# Patient Record
Sex: Female | Born: 1960 | Race: Black or African American | Hispanic: No | Smoking: Former smoker
Health system: Southern US, Community
[De-identification: ages and names within clinical notes are randomized; demographics above are authoritative.]

## PROBLEM LIST (undated history)

## (undated) DIAGNOSIS — K2970 Gastritis, unspecified, without bleeding: Secondary | ICD-10-CM

## (undated) DIAGNOSIS — Z972 Presence of dental prosthetic device (complete) (partial): Secondary | ICD-10-CM

## (undated) DIAGNOSIS — I10 Essential (primary) hypertension: Secondary | ICD-10-CM

## (undated) HISTORY — DX: Gastritis, unspecified, without bleeding: K29.70

## (undated) HISTORY — DX: Essential (primary) hypertension: I10

---

## 2003-02-13 ENCOUNTER — Other Ambulatory Visit: Payer: Self-pay

## 2003-11-15 ENCOUNTER — Ambulatory Visit: Payer: Self-pay | Admitting: Family Medicine

## 2010-02-08 HISTORY — PX: COLONOSCOPY: SHX174

## 2011-05-25 ENCOUNTER — Ambulatory Visit: Payer: Self-pay | Admitting: Family Medicine

## 2012-08-03 ENCOUNTER — Ambulatory Visit: Payer: Self-pay | Admitting: Family Medicine

## 2013-08-07 ENCOUNTER — Ambulatory Visit: Payer: Self-pay | Admitting: Family Medicine

## 2013-08-16 ENCOUNTER — Ambulatory Visit: Payer: Self-pay | Admitting: Family Medicine

## 2013-10-08 ENCOUNTER — Ambulatory Visit: Payer: Self-pay | Admitting: Family Medicine

## 2013-10-09 ENCOUNTER — Ambulatory Visit: Payer: Self-pay | Admitting: Family Medicine

## 2014-07-11 ENCOUNTER — Other Ambulatory Visit: Payer: Self-pay | Admitting: Family Medicine

## 2014-07-11 DIAGNOSIS — I10 Essential (primary) hypertension: Secondary | ICD-10-CM

## 2014-08-05 ENCOUNTER — Encounter: Payer: Self-pay | Admitting: Family Medicine

## 2014-08-05 ENCOUNTER — Ambulatory Visit (INDEPENDENT_AMBULATORY_CARE_PROVIDER_SITE_OTHER): Payer: BC Managed Care – PPO | Admitting: Family Medicine

## 2014-08-05 VITALS — BP 130/82 | HR 80 | Ht 61.0 in | Wt 120.0 lb

## 2014-08-05 DIAGNOSIS — I10 Essential (primary) hypertension: Secondary | ICD-10-CM | POA: Diagnosis not present

## 2014-08-05 DIAGNOSIS — Z0000 Encounter for general adult medical examination without abnormal findings: Secondary | ICD-10-CM | POA: Insufficient documentation

## 2014-08-05 LAB — POCT URINALYSIS DIPSTICK
BILIRUBIN UA: NEGATIVE
Blood, UA: NEGATIVE
Glucose, UA: NEGATIVE
KETONES UA: NEGATIVE
LEUKOCYTES UA: NEGATIVE
NITRITE UA: NEGATIVE
Protein, UA: NEGATIVE
Spec Grav, UA: 1.02
Urobilinogen, UA: NEGATIVE
pH, UA: 5

## 2014-08-05 MED ORDER — AMLODIPINE BESYLATE 5 MG PO TABS: 5.0000 mg | ORAL_TABLET | Freq: Every day | ORAL | Status: DC

## 2014-08-06 NOTE — Progress Notes (Signed)
Name: Samantha KennedyMary Delois Irwin   MRN: [id number]    DOB: [birth year]   Date:08/06/2014       Progress Note  Subjective  Chief Complaint  Chief Complaint  Patient presents with  . Annual Exam    Pt. denies any problems    HPI Comments: Patient without any concerns or needs  Other Pertinent negatives include no abdominal pain, chest pain, chills, coughing, fever, headaches, myalgias, nausea, neck pain, rash or sore throat.    No problem-specific assessment & plan notes found for this encounter.   No past medical history on file.  Past Surgical History  Procedure Laterality Date  . Vaginal hysterectomy      partial    No family history on file.  History   Social History  . Marital Status: Divorced    Spouse Name: N/A  . Number of Children: N/A  . Years of Education: N/A   Occupational History  . Not on file.   Social History Main Topics  . Smoking status: Current Every Day Smoker -- 1.00 packs/day    Types: Cigarettes  . Smokeless tobacco: Not on file  . Alcohol Use: 0.0 oz/week    0 Standard drinks or equivalent per week  . Drug Use: No  . Sexual Activity: Not on file   Other Topics Concern  . Not on file   Social History Narrative  . No narrative on file    Not on File   Review of Systems  Constitutional: Negative for fever, chills, weight loss and malaise/fatigue.  HENT: Negative for ear discharge, ear pain and sore throat.   Eyes: Negative for blurred vision.  Respiratory: Negative for cough, sputum production, shortness of breath and wheezing.   Cardiovascular: Negative for chest pain, palpitations and leg swelling.  Gastrointestinal: Negative for heartburn, nausea, abdominal pain, diarrhea, constipation, blood in stool and melena.  Genitourinary: Negative for dysuria, urgency, frequency and hematuria.  Musculoskeletal: Negative for myalgias, back pain, joint pain and neck pain.  Skin: Negative for rash.  Neurological: Negative for dizziness,  tingling, sensory change, focal weakness and headaches.  Endo/Heme/Allergies: Negative for environmental allergies and polydipsia. Does not bruise/bleed easily.  Psychiatric/Behavioral: Negative for depression and suicidal ideas. The patient is not nervous/anxious and does not have insomnia.      Objective  Filed Vitals:   08/05/14 1458  BP: 130/82  Pulse: 80  Height: 5\' 1"  (1.549 m)  Weight: 120 lb (54.432 kg)    Physical Exam  Constitutional: She is well-developed, well-nourished, and in no distress. No distress.  HENT:  Head: Normocephalic and atraumatic.  Right Ear: External ear normal.  Left Ear: External ear normal.  Nose: Nose normal.  Mouth/Throat: Oropharynx is clear and moist.  Eyes: Conjunctivae and EOM are normal. Pupils are equal, round, and reactive to light. Right eye exhibits no discharge. Left eye exhibits no discharge.  Neck: Normal range of motion. Neck supple. No JVD present. No thyromegaly present.  Cardiovascular: Normal rate, regular rhythm, normal heart sounds and intact distal pulses.  Exam reveals no gallop and no friction rub.   No murmur heard. Pulmonary/Chest: Effort normal and breath sounds normal. Right breast exhibits no inverted nipple, no mass, no nipple discharge and no tenderness. Left breast exhibits no inverted nipple, no mass, no nipple discharge and no tenderness. Breasts are symmetrical.  Abdominal: Soft. Bowel sounds are normal. She exhibits no mass. There is no tenderness. There is no guarding.  Genitourinary: Vagina normal, right adnexa normal and left adnexa  normal. Guaiac negative stool.  Musculoskeletal: Normal range of motion. She exhibits no edema.  Lymphadenopathy:    She has no cervical adenopathy.  Neurological: She is alert. She has normal reflexes.  Skin: Skin is warm and dry. She is not diaphoretic.  Psychiatric: Mood and affect normal.  Nursing note and vitals reviewed.     Assessment & Plan  Problem List Items  Addressed This Visit      Other   Annual physical exam - Primary   Relevant Orders   POCT urinalysis dipstick (Completed)   Lipid Profile   Pap IG (Image Guided)   MM Digital Screening    Other Visit Diagnoses    Essential hypertension        Relevant Medications    amLODipine (NORVASC) 5 MG tablet    Other Relevant Orders    Renal Function Panel         Dr. Elizabeth Sauer [facility]  08/06/2014

## 2014-08-08 LAB — PAP IG (IMAGE GUIDED): PAP Smear Comment: 0

## 2014-08-14 ENCOUNTER — Ambulatory Visit
Admission: RE | Admit: 2014-08-14 | Discharge: 2014-08-14 | Disposition: A | Discharge status: Home / Self Care | Payer: BC Managed Care – PPO | Source: Ambulatory Visit | Attending: Family Medicine | Admitting: Family Medicine

## 2014-08-14 DIAGNOSIS — Z0000 Encounter for general adult medical examination without abnormal findings: Secondary | ICD-10-CM

## 2014-08-14 DIAGNOSIS — Z1231 Encounter for screening mammogram for malignant neoplasm of breast: Secondary | ICD-10-CM | POA: Insufficient documentation

## 2014-11-07 ENCOUNTER — Other Ambulatory Visit: Payer: Self-pay

## 2014-11-15 ENCOUNTER — Ambulatory Visit (INDEPENDENT_AMBULATORY_CARE_PROVIDER_SITE_OTHER): Payer: BC Managed Care – PPO | Admitting: Family Medicine

## 2014-11-15 ENCOUNTER — Encounter: Payer: Self-pay | Admitting: Family Medicine

## 2014-11-15 VITALS — BP 122/84 | HR 80 | Ht 61.0 in | Wt 121.0 lb

## 2014-11-15 DIAGNOSIS — N898 Other specified noninflammatory disorders of vagina: Secondary | ICD-10-CM | POA: Diagnosis not present

## 2014-11-15 DIAGNOSIS — H6123 Impacted cerumen, bilateral: Secondary | ICD-10-CM | POA: Diagnosis not present

## 2014-11-15 DIAGNOSIS — N939 Abnormal uterine and vaginal bleeding, unspecified: Secondary | ICD-10-CM

## 2014-11-15 DIAGNOSIS — S30814A Abrasion of vagina and vulva, initial encounter: Secondary | ICD-10-CM

## 2014-11-15 NOTE — Progress Notes (Signed)
Name: Samantha Irwin   MRN: [id number]    DOB: 06-28-1960   Date:11/15/2014       Progress Note  Subjective  Chief Complaint  Chief Complaint  Patient presents with  . Vaginal Bleeding    off menstrual cycle x 5 years- noticed pink spotting after sex- not enough to wear a panty liner. Has stopped- had a pap in June that was normal    Vaginal Bleeding The patient's pertinent negatives include no genital itching, genital lesions, genital odor, genital rash, missed menses, pelvic pain, vaginal bleeding or vaginal discharge. This is a chronic problem. The current episode started 1 to 4 weeks ago. The problem occurs intermittently. The problem has been waxing and waning. The patient is experiencing no pain. She is not pregnant. Pertinent negatives include no abdominal pain, back pain, chills, constipation, dysuria, fever, flank pain, frequency, painful intercourse or rash. Nothing aggravates the symptoms. She has tried nothing for the symptoms. She is sexually active. She uses nothing for contraception. She is postmenopausal.    No problem-specific assessment & plan notes found for this encounter.   Past Medical History  Diagnosis Date  . Hypertension     Past Surgical History  Procedure Laterality Date  . Colonoscopy  2012    cleared for 5 yrs- [place]    Family History  Problem Relation Age of Onset  . Diabetes Mother     Social History   Social History  . Marital Status: Divorced    Spouse Name: N/A  . Number of Children: N/A  . Years of Education: N/A   Occupational History  . Not on file.   Social History Main Topics  . Smoking status: Current Every Day Smoker -- 1.00 packs/day    Types: Cigarettes  . Smokeless tobacco: Not on file  . Alcohol Use: 0.0 oz/week    0 Standard drinks or equivalent per week  . Drug Use: No  . Sexual Activity: Yes   Other Topics Concern  . Not on file   Social History Narrative    No Known Allergies   Review of Systems   Constitutional: Negative for fever, chills and weight loss.  Respiratory: Negative for cough and shortness of breath.   Cardiovascular: Negative for chest pain.  Gastrointestinal: Negative for heartburn, abdominal pain and constipation.  Genitourinary: Positive for vaginal bleeding. Negative for dysuria, frequency, flank pain, vaginal discharge, pelvic pain and missed menses.       Gyn see HPI  Musculoskeletal: Negative for back pain.  Skin: Negative for itching and rash.  Endo/Heme/Allergies: Does not bruise/bleed easily.     Objective  Filed Vitals:   11/15/14 0942  BP: 122/84  Pulse: 80  Height:  (1.549 m)  Weight: 121 lb (54.885 kg)    Physical Exam  Constitutional: She is well-developed, well-nourished, and in no distress. No distress.  HENT:  Head: Normocephalic and atraumatic.  Right Ear: External ear normal.  Left Ear: External ear normal.  Nose: Nose normal.  Mouth/Throat: Oropharynx is clear and moist.  Eyes: Conjunctivae and EOM are normal. Pupils are equal, round, and reactive to light. Right eye exhibits no discharge. Left eye exhibits no discharge.  Neck: Normal range of motion. Neck supple. No JVD present. No thyromegaly present.  Cardiovascular: Normal rate, regular rhythm, normal heart sounds and intact distal pulses.  Exam reveals no gallop and no friction rub.   No murmur heard. Pulmonary/Chest: Effort normal and breath sounds normal.  Abdominal: Soft. Bowel sounds are  normal. She exhibits no mass. There is no tenderness. There is no guarding.  Genitourinary: Vagina normal. No vaginal discharge found.  Vaginal abrasion 3 o'clock  Musculoskeletal: Normal range of motion. She exhibits no edema.  Lymphadenopathy:    She has no cervical adenopathy.  Neurological: She is alert. She has normal reflexes.  Skin: Skin is warm and dry. She is not diaphoretic.  Psychiatric: Mood and affect normal.      Assessment & Plan  Problem List Items Addressed  This Visit    None    Visit Diagnoses    Vaginal spotting    -  Primary    Cerumen impaction, bilateral        bulb syringe and debrox    Vaginal abrasion, initial encounter             Dr. Elizabeth Sauer [facility]  11/15/2014

## 2015-03-31 ENCOUNTER — Other Ambulatory Visit: Payer: Self-pay

## 2015-04-09 ENCOUNTER — Ambulatory Visit (INDEPENDENT_AMBULATORY_CARE_PROVIDER_SITE_OTHER): Payer: BC Managed Care – PPO | Admitting: Family Medicine

## 2015-04-09 ENCOUNTER — Encounter: Payer: Self-pay | Admitting: Family Medicine

## 2015-04-09 VITALS — BP 130/100 | HR 68 | Temp 97.8°F | Ht 61.0 in | Wt 120.0 lb

## 2015-04-09 DIAGNOSIS — E860 Dehydration: Secondary | ICD-10-CM | POA: Diagnosis not present

## 2015-04-09 DIAGNOSIS — J0110 Acute frontal sinusitis, unspecified: Secondary | ICD-10-CM

## 2015-04-09 MED ORDER — AMOXICILLIN 500 MG PO CAPS: 500.0000 mg | ORAL_CAPSULE | Freq: Three times a day (TID) | ORAL | Status: DC

## 2015-04-09 NOTE — Progress Notes (Signed)
Name: Samantha Irwin   MRN: [id number]    DOB: 1961/01/06   Date:04/09/2015       Progress Note  Subjective  Chief Complaint  Chief Complaint  Patient presents with  . Sinusitis    with "jittery feeling" some nausea    Sinusitis This is a new problem. The current episode started in the past 7 days. The problem has been waxing and waning since onset. There has been no fever. Associated symptoms include congestion, diaphoresis, ear pain, headaches and sinus pressure. Pertinent negatives include no chills, coughing, hoarse voice, neck pain, shortness of breath, sneezing, sore throat or swollen glands. (Shakified/ dizziness/ fatigued/ malaise/nausea) Past treatments include acetaminophen. The treatment provided mild relief.    No problem-specific assessment & plan notes found for this encounter.   Past Medical History  Diagnosis Date  . Hypertension     Past Surgical History  Procedure Laterality Date  . Colonoscopy  2012    cleared for 5 yrs- [place]    Family History  Problem Relation Age of Onset  . Diabetes Mother     Social History   Social History  . Marital Status: Divorced    Spouse Name: N/A  . Number of Children: N/A  . Years of Education: N/A   Occupational History  . Not on file.   Social History Main Topics  . Smoking status: Current Every Day Smoker -- 1.00 packs/day    Types: Cigarettes  . Smokeless tobacco: Not on file  . Alcohol Use: 0.0 oz/week    0 Standard drinks or equivalent per week  . Drug Use: No  . Sexual Activity: Yes   Other Topics Concern  . Not on file   Social History Narrative    No Known Allergies   Review of Systems  Constitutional: Positive for diaphoresis. Negative for fever, chills, weight loss and malaise/fatigue.  HENT: Positive for congestion, ear pain and sinus pressure. Negative for ear discharge, hoarse voice, sneezing and sore throat.   Eyes: Negative for blurred vision.  Respiratory: Negative for cough,  sputum production, shortness of breath and wheezing.   Cardiovascular: Negative for chest pain, palpitations and leg swelling.  Gastrointestinal: Negative for heartburn, nausea, abdominal pain, diarrhea, constipation, blood in stool and melena.  Genitourinary: Negative for dysuria, urgency, frequency and hematuria.  Musculoskeletal: Negative for myalgias, back pain, joint pain and neck pain.  Skin: Negative for rash.  Neurological: Positive for headaches. Negative for dizziness, tingling, sensory change and focal weakness.  Endo/Heme/Allergies: Negative for environmental allergies and polydipsia. Does not bruise/bleed easily.  Psychiatric/Behavioral: Negative for depression and suicidal ideas. The patient is not nervous/anxious and does not have insomnia.      Objective  Filed Vitals:   04/09/15 0841  BP: 130/100  Pulse: 68  Temp: 97.8 F (36.6 C)  TempSrc: Oral  Height:  (1.549 m)  Weight: 120 lb (54.432 kg)    Physical Exam  Constitutional: She is well-developed, well-nourished, and in no distress. No distress.  HENT:  Head: Normocephalic and atraumatic.  Right Ear: External ear normal.  Left Ear: External ear normal.  Nose: Nose normal.  Mouth/Throat: Oropharynx is clear and moist.  Eyes: Conjunctivae and EOM are normal. Pupils are equal, round, and reactive to light. Right eye exhibits no discharge. Left eye exhibits no discharge.  Neck: Normal range of motion. Neck supple. No JVD present. No thyromegaly present.  Cardiovascular: Normal rate, regular rhythm, normal heart sounds and intact distal pulses.  Exam reveals no  gallop and no friction rub.   No murmur heard. Pulmonary/Chest: Effort normal and breath sounds normal.  Abdominal: Soft. Bowel sounds are normal. She exhibits no mass. There is no tenderness. There is no guarding.  Musculoskeletal: Normal range of motion. She exhibits no edema.  Lymphadenopathy:    She has no cervical adenopathy.  Neurological: She  is alert. She has normal reflexes.  Skin: Skin is warm and dry. She is not diaphoretic.  Psychiatric: Mood and affect normal.  Nursing note and vitals reviewed.     Assessment & Plan  Problem List Items Addressed This Visit    None    Visit Diagnoses    Acute frontal sinusitis, recurrence not specified    -  Primary    Relevant Medications    amoxicillin (AMOXIL) 500 MG capsule    Dehydration             Dr. Hayden [facility]  04/09/2015

## 2015-06-02 ENCOUNTER — Other Ambulatory Visit: Payer: Self-pay | Admitting: Family Medicine

## 2015-06-03 ENCOUNTER — Other Ambulatory Visit: Payer: Self-pay

## 2015-06-03 DIAGNOSIS — I10 Essential (primary) hypertension: Secondary | ICD-10-CM

## 2015-06-03 MED ORDER — AMLODIPINE BESYLATE 5 MG PO TABS: 5.0000 mg | ORAL_TABLET | Freq: Every day | ORAL | Status: DC

## 2015-06-12 ENCOUNTER — Encounter: Payer: Self-pay | Admitting: Family Medicine

## 2015-06-12 ENCOUNTER — Ambulatory Visit (INDEPENDENT_AMBULATORY_CARE_PROVIDER_SITE_OTHER): Payer: BC Managed Care – PPO | Admitting: Family Medicine

## 2015-06-12 VITALS — BP 122/82 | HR 78 | Ht 61.0 in | Wt 120.0 lb

## 2015-06-12 DIAGNOSIS — I10 Essential (primary) hypertension: Secondary | ICD-10-CM

## 2015-06-12 DIAGNOSIS — R351 Nocturia: Secondary | ICD-10-CM

## 2015-06-12 LAB — POCT URINALYSIS DIPSTICK
BILIRUBIN UA: NEGATIVE
Glucose, UA: NEGATIVE
KETONES UA: NEGATIVE
Leukocytes, UA: NEGATIVE
NITRITE UA: NEGATIVE
PH UA: 5
Protein, UA: NEGATIVE
RBC UA: NEGATIVE
Spec Grav, UA: 1.01
Urobilinogen, UA: 0.2

## 2015-06-12 MED ORDER — AMLODIPINE BESYLATE 5 MG PO TABS: 5.0000 mg | ORAL_TABLET | Freq: Every day | ORAL | Status: DC

## 2015-06-12 NOTE — Progress Notes (Signed)
Name: Samantha Irwin   MRN: [id number]    DOB: 21-May-1960   Date:06/12/2015       Progress Note  Subjective  Chief Complaint  Chief Complaint  Patient presents with  . Hypertension    Hypertension This is a chronic problem. The current episode started more than 1 year ago. The problem has been gradually improving since onset. The problem is controlled. Pertinent negatives include no anxiety, blurred vision, chest pain, headaches, malaise/fatigue, neck pain, orthopnea, palpitations, peripheral edema, PND, shortness of breath or sweats. There are no associated agents to hypertension. There are no known risk factors for coronary artery disease. Past treatments include calcium channel blockers. The current treatment provides moderate improvement. There are no compliance problems.  There is no history of angina, kidney disease, CAD/MI, CVA, heart failure, left ventricular hypertrophy, PVD, renovascular disease or retinopathy. There is no history of chronic renal disease or a hypertension causing med.    No problem-specific assessment & plan notes found for this encounter.   Past Medical History  Diagnosis Date  . Hypertension     Past Surgical History  Procedure Laterality Date  . Colonoscopy  2012    cleared for 5 yrs- [place]    Family History  Problem Relation Age of Onset  . Diabetes Mother     Social History   Social History  . Marital Status: Divorced    Spouse Name: N/A  . Number of Children: N/A  . Years of Education: N/A   Occupational History  . Not on file.   Social History Main Topics  . Smoking status: Current Every Day Smoker -- 1.00 packs/day    Types: Cigarettes  . Smokeless tobacco: Not on file  . Alcohol Use: 0.0 oz/week    0 Standard drinks or equivalent per week  . Drug Use: No  . Sexual Activity: Yes   Other Topics Concern  . Not on file   Social History Narrative    No Known Allergies   Review of Systems  Constitutional: Negative  for fever, chills, weight loss and malaise/fatigue.  HENT: Negative for ear discharge, ear pain and sore throat.   Eyes: Negative for blurred vision.  Respiratory: Negative for cough, sputum production, shortness of breath and wheezing.   Cardiovascular: Negative for chest pain, palpitations, orthopnea, leg swelling and PND.  Gastrointestinal: Negative for heartburn, nausea, abdominal pain, diarrhea, constipation, blood in stool and melena.  Genitourinary: Positive for frequency. Negative for dysuria, urgency and hematuria.  Musculoskeletal: Negative for myalgias, back pain, joint pain and neck pain.  Skin: Negative for rash.  Neurological: Negative for dizziness, tingling, sensory change, focal weakness and headaches.  Endo/Heme/Allergies: Negative for environmental allergies and polydipsia. Does not bruise/bleed easily.  Psychiatric/Behavioral: Negative for depression and suicidal ideas. The patient is not nervous/anxious and does not have insomnia.      Objective  Filed Vitals:   06/12/15 0808  BP: 122/82  Pulse: 78  Height:  (1.549 m)  Weight: 120 lb (54.432 kg)    Physical Exam  Constitutional: She is well-developed, well-nourished, and in no distress. No distress.  HENT:  Head: Normocephalic and atraumatic.  Right Ear: External ear normal.  Left Ear: External ear normal.  Nose: Nose normal.  Mouth/Throat: Oropharynx is clear and moist.  Eyes: Conjunctivae and EOM are normal. Pupils are equal, round, and reactive to light. Right eye exhibits no discharge. Left eye exhibits no discharge.  Neck: Normal range of motion. Neck supple. No JVD present. No  thyromegaly present.  Cardiovascular: Normal rate, regular rhythm, normal heart sounds and intact distal pulses.  Exam reveals no gallop and no friction rub.   No murmur heard. Pulmonary/Chest: Effort normal and breath sounds normal. She has no wheezes. She has no rales.  Abdominal: Soft. Bowel sounds are normal. She  exhibits no mass. There is no tenderness. There is no guarding.  Musculoskeletal: Normal range of motion. She exhibits no edema.  Lymphadenopathy:    She has no cervical adenopathy.  Neurological: She is alert. She has normal reflexes.  Skin: Skin is warm and dry. She is not diaphoretic.  Psychiatric: Mood and affect normal.  Nursing note and vitals reviewed.     Assessment & Plan  Problem List Items Addressed This Visit      Cardiovascular and Mediastinum   Essential hypertension - Primary   Relevant Medications   amLODipine (NORVASC) 5 MG tablet   Other Relevant Orders   POCT urinalysis dipstick (Completed)   Renal Function Panel   Hemoglobin A1c    Other Visit Diagnoses    Nocturia        Relevant Orders    Hemoglobin A1c         Dr. Hayden Rasmusseneanna [facility]  06/12/2015

## 2015-06-12 NOTE — Patient Instructions (Signed)
Smoking: Ways to Quit   Know why you want to quit.   When you quit smoking, your body gets to work repairing damaged tissues. Here are some of the health benefits:   You stop the destruction of your lungs.  Your lungs are better able to fight colds, and other respiratory infections.  You decrease your risk of cancer, heart disease, strokes, and circulation problems.   In addition, when you quit you will:   Feel more in control of your life.  Have better smelling hair, breath, clothes, home, and car.  Have more stamina for activities.  Save money.  Protect your family and friends from the dangers of secondhand smoke.   Smoking is an addictive habit. Most former smokers make several attempts to quit before they finally succeed. So, never say, "I can't." Just keep trying.   Set a quit date.   Set a date for when you will stop smoking. Don't buy cigarettes to carry you beyond your last day. Tell your family and friends you plan to quit, and ask for their support and encouragement. Ask them not to offer you cigarettes.  Make a plan.   5 Days Before Your Quit Date   Think about your reasons for quitting.  Tell your friends and family you are planning to quit.  Stop buying cigarettes.   4 Days Before Your Quit Date   Pay attention to when and why you smoke.  Think of other things to hold in your hand instead of a cigarette.  Think of habits or routines to change.   3 Days Before Your Quit Date   Plan what you will do with the extra money when you stop buying cigarettes.  Think of whom you can reach out to when you need help.   2 Days Before Your Quit Date   Consider buying nonprescription nicotine patches or nicotine gum. Or see your health care provider to get a prescription for the nicotine inhaler, nasal spray, or other medicine that can help.     1 Day Before Your Quit Date   Put away lighters and ashtrays.  Throw away all cigarettes and matches - no emergency stashes  are allowed!  Clean your clothes to get rid of the smell of cigarette smoke.   Quit Day   Keep very busy.  Remind family and friends that this is your quit day.  Stay away from alcohol.  Stay away from places where you used to smoke and people you used to smoke with.  Give yourself a treat or do something else special.   Commit to staying quit.   Make sure that all your cigarettes and ashtrays are thrown away.   If you keep cigarettes or ashtrays around, sooner or later you'll break down and smoke one, then another, then another, and so on. Throw them away. Make it hard to start again.   Because you are used to having something in your mouth, you may want to chew gum as a substitute for smoking. Or munch on carrots or celery.   Spend time with nonsmokers rather than with smokers.   Think of yourself as a nonsmoker. Tell other people that you are a nonsmoker (for example, in restaurants). Stay away from places where there are a lot of smokers, such as bars. Avoid spending time with smokers. You can't tell others not to smoke, but you don't have to sit with them while they do. Plan on walking away from cigarette smoke. Spend time   with nonsmokers and sit in the nonsmoking section of restaurants.   Be prepared for relapse or difficult situations.   Most people who go back to smoking cigarettes do so within the first 3 months after quitting. Many people try 5 or more times before they successfully quit. Avoid drinking alcohol, because it lowers your chances of success. Don't be distracted by the weight you may gain after quitting. Smokers usually do not gain more than 10 pounds when they stop smoking. Learn new ways to improve your mood and overcome depression.   Start an exercise program.   As you become more fit, you will not want the nicotine effects in your body. Regular exercise will also help keep you from gaining weight.   Keep your hands busy.   You may not know what to do with  your hands for a while. Try reading, knitting, needlework, pottery, drawing, making a plastic model, or doing a jigsaw puzzle. Join special interest groups that keep you involved in your hobby.      Take on new activities.   Change your routine. Take on new activities that don't include smoking. Join an exercise group and work out regularly. Sign up for an evening class or a join a study group at your place of worship. Go on more outings with your family or friends. Learn ways to relax and manage stress.   Join a quit-smoking program if it helps.   Some people do better in groups, or with a set of instructions to follow. That's fine, too. Remember, the goal is to quit smoking. It doesn't matter how you do it.   Consider using nicotine replacement therapy.   Nicotine is the drug that is in tobacco. You can use nicotine patches or gum, available without a prescription at your local pharmacy, to help you quit smoking. Quitting smoking is a two-step process. It includes breaking the physical addiction to nicotine and breaking the smoking habit. Nicotine replacement helps take care of the nicotine addiction so that you can focus on breaking the habit.   Your health care provider can prescribe nicotine substitutes that can almost double your chances of quitting for good. They are:   Zyban (bupropion HCL)  nicotine inhaler  nicotine lozenge  nicotine nasal spray  nicotine patch.   None of these treatments is a miracle cure. Quitting can be hard work, but you can learn to live without cigarettes in your daily life.  

## 2015-06-13 LAB — RENAL FUNCTION PANEL
ALBUMIN: 4.1 g/dL (ref 3.5–5.5)
BUN/Creatinine Ratio: 18 (ref 9–23)
BUN: 16 mg/dL (ref 6–24)
CALCIUM: 9.5 mg/dL (ref 8.7–10.2)
CHLORIDE: 101 mmol/L (ref 96–106)
CO2: 27 mmol/L (ref 18–29)
Creatinine, Ser: 0.88 mg/dL (ref 0.57–1.00)
GFR calc Af Amer: 85 mL/min/{1.73_m2} (ref >59)
GFR calc non Af Amer: 74 mL/min/{1.73_m2} (ref >59)
Glucose: 78 mg/dL (ref 65–99)
Phosphorus: 3.3 mg/dL (ref 2.5–4.5)
Potassium: 4.4 mmol/L (ref 3.5–5.2)
Sodium: 143 mmol/L (ref 134–144)

## 2015-06-13 LAB — HEMOGLOBIN A1C
Est. average glucose Bld gHb Est-mCnc: 114 mg/dL
HEMOGLOBIN A1C: 5.6 % (ref 4.8–5.6)

## 2015-07-18 ENCOUNTER — Ambulatory Visit
Admission: EM | Admit: 2015-07-18 | Discharge: 2015-07-18 | Disposition: A | Discharge status: Home / Self Care | Payer: BC Managed Care – PPO | Attending: Family Medicine | Admitting: Family Medicine

## 2015-07-18 DIAGNOSIS — S39012A Strain of muscle, fascia and tendon of lower back, initial encounter: Secondary | ICD-10-CM

## 2015-07-18 LAB — URINALYSIS COMPLETE WITH MICROSCOPIC (ARMC ONLY)
BILIRUBIN URINE: NEGATIVE
Glucose, UA: NEGATIVE mg/dL
Hgb urine dipstick: NEGATIVE
KETONES UR: NEGATIVE mg/dL
LEUKOCYTES UA: NEGATIVE
Nitrite: NEGATIVE
PH: 7.5 (ref 5.0–8.0)
Protein, ur: NEGATIVE mg/dL
RBC / HPF: NONE SEEN RBC/hpf (ref 0–5)
SPECIFIC GRAVITY, URINE: 1.02 (ref 1.005–1.030)

## 2015-07-18 MED ORDER — CYCLOBENZAPRINE HCL 10 MG PO TABS: 10.0000 mg | ORAL_TABLET | Freq: Every day | ORAL | Status: DC

## 2015-07-18 MED ORDER — HYDROCODONE-ACETAMINOPHEN 5-325 MG PO TABS: ORAL_TABLET | ORAL | Status: DC

## 2015-07-18 NOTE — ED Provider Notes (Signed)
CSN: [id number]     Arrival date & time 07/18/15  4098 History   None    Chief Complaint  Patient presents with  . Back Pain   (Consider location/radiation/quality/duration/timing/severity/associated sxs/prior Treatment) Patient is a 55 y.o. female presenting with back pain. The history is provided by the patient.  Back Pain Location:  Lumbar spine Quality:  Stabbing Radiates to:  Does not radiate Pain severity:  Moderate Pain is:  Worse during the day Onset quality:  Sudden Duration:  3 days Timing:  Intermittent Progression:  Unchanged Chronicity:  New Context: not emotional stress, not falling, not jumping from heights, not lifting heavy objects, not MCA, not MVA, not occupational injury, not pedestrian accident, not physical stress, not recent illness, not recent injury and not twisting   Relieved by:  OTC medications (mild relief with tylenol) Worsened by:  Movement, twisting, ambulation and bending Associated symptoms: dysuria (minimal)   Associated symptoms: no abdominal pain, no abdominal swelling, no bladder incontinence, no bowel incontinence, no chest pain, no fever, no headaches, no leg pain, no numbness, no paresthesias, no pelvic pain, no perianal numbness, no tingling, no weakness and no weight loss   Risk factors: no hx of cancer, no hx of osteoporosis, not pregnant, no recent surgery, no steroid use and no vascular disease     Past Medical History  Diagnosis Date  . Hypertension    Past Surgical History  Procedure Laterality Date  . Colonoscopy  2012    cleared for 5 yrs- [place]   Family History  Problem Relation Age of Onset  . Diabetes Mother    Social History  Substance Use Topics  . Smoking status: Current Every Day Smoker -- 1.00 packs/day    Types: Cigarettes  . Smokeless tobacco: None  . Alcohol Use: 0.0 oz/week    0 Standard drinks or equivalent per week   OB History    No data available     Review of Systems  Constitutional: Negative for  fever and weight loss.  Cardiovascular: Negative for chest pain.  Gastrointestinal: Negative for abdominal pain and bowel incontinence.  Genitourinary: Positive for dysuria (minimal). Negative for bladder incontinence and pelvic pain.  Musculoskeletal: Positive for back pain.  Neurological: Negative for tingling, weakness, numbness, headaches and paresthesias.    Allergies  Review of patient's allergies indicates no known allergies.  Home Medications   Prior to Admission medications   Medication Sig Start Date End Date Taking? Authorizing Provider  amLODipine (NORVASC) 5 MG tablet Take 1 tablet (5 mg total) by mouth daily. 06/12/15   Duanne Limerick, MD  cholecalciferol (VITAMIN D) 1000 UNITS tablet Take 1,000 Units by mouth daily.    Historical Provider, MD  cyclobenzaprine (FLEXERIL) 10 MG tablet Take 1 tablet (10 mg total) by mouth at bedtime. 07/18/15   Payton Mccallum, MD  HYDROcodone-acetaminophen (NORCO/VICODIN) 5-325 MG tablet 1-2 tabs po bid prn 07/18/15   Payton Mccallum, MD  Multiple Vitamins-Minerals (MULTIVITAMIN WOMEN 50+ PO) Take 1 tablet by mouth daily.    Historical Provider, MD  Omega-3 Fatty Acids (FISH OIL) 1000 MG CAPS Take 1 capsule by mouth daily.    Historical Provider, MD  vitamin C (ASCORBIC ACID) 250 MG tablet Take 250 mg by mouth daily.    Historical Provider, MD   Meds Ordered and Administered this Visit  Medications - No data to display  BP 157/78 mmHg  Pulse 78  Temp(Src) 97.8 F (36.6 C) (Oral)  Resp 16  Ht  (1.549  m)  Wt 118 lb (53.524 kg)  BMI 22.31 kg/m2  SpO2 100% No data found.   Physical Exam  Constitutional: She appears well-developed and well-nourished. No distress.  Musculoskeletal: She exhibits tenderness. She exhibits no edema.       Lumbar back: She exhibits tenderness (over the lumbar paraspinous muscles bilaterally) and spasm. She exhibits normal range of motion, no bony tenderness, no swelling, no edema, no deformity, no laceration, no  pain and normal pulse.  Neurological: She is alert. She has normal reflexes. She exhibits normal muscle tone.  Skin: Skin is warm and dry. No rash noted. She is not diaphoretic. No erythema.  Nursing note and vitals reviewed.   ED Course  Procedures (including critical care time)  Labs Review Labs Reviewed  URINALYSIS COMPLETEWITH MICROSCOPIC ([facility] ONLY) - Abnormal; Notable for the following:    Bacteria, UA RARE (*)    Squamous Epithelial / LPF 6-30 (*)    All other components within normal limits    Imaging Review No results found.   Visual Acuity Review  Right Eye Distance:   Left Eye Distance:   Bilateral Distance:    Right Eye Near:   Left Eye Near:    Bilateral Near:         MDM   1. Lumbar strain, initial encounter     Discharge Medication List as of 07/18/2015  9:19 AM    START taking these medications   Details  cyclobenzaprine (FLEXERIL) 10 MG tablet Take 1 tablet (10 mg total) by mouth at bedtime., Starting 07/18/2015, Until Discontinued, Normal    HYDROcodone-acetaminophen (NORCO/VICODIN) 5-325 MG tablet 1-2 tabs po bid prn, Print       1. Lab results and diagnosis reviewed with patient 2. rx as per orders above; reviewed possible side effects, interactions, risks and benefits  3. Recommend supportive treatment with heat, gentle stretches 4. Follow-up prn if symptoms worsen or don't improve  Payton Mccallumrlando Montavious Wierzba, MD 07/18/15 1127

## 2015-07-18 NOTE — Discharge Instructions (Signed)

## 2015-07-18 NOTE — ED Notes (Signed)
Pt c/o lower back since Wednesday, worse with cough or movement.. Pt also c/o some burning with urination..Marland Kitchen

## 2015-10-09 ENCOUNTER — Ambulatory Visit (INDEPENDENT_AMBULATORY_CARE_PROVIDER_SITE_OTHER): Payer: BC Managed Care – PPO | Admitting: Family Medicine

## 2015-10-09 VITALS — BP 148/110 | HR 78 | Ht 61.0 in | Wt 119.0 lb

## 2015-10-09 DIAGNOSIS — H6123 Impacted cerumen, bilateral: Secondary | ICD-10-CM

## 2015-10-09 DIAGNOSIS — M10071 Idiopathic gout, right ankle and foot: Secondary | ICD-10-CM | POA: Diagnosis not present

## 2015-10-09 MED ORDER — INDOMETHACIN 50 MG PO CAPS: 50.0000 mg | ORAL_CAPSULE | Freq: Two times a day (BID) | ORAL | 0 refills | Status: DC

## 2015-10-09 MED ORDER — COLCHICINE 0.6 MG PO TABS: 0.6000 mg | ORAL_TABLET | Freq: Every day | ORAL | 0 refills | Status: DC

## 2015-10-09 NOTE — Progress Notes (Signed)
Name: Samantha KennedyMary Delois Irwin   MRN: [id number]    DOB: 07-Feb-1961   Date:10/09/2015       Progress Note  Subjective  Chief Complaint  Chief Complaint  Patient presents with  . Gout    R) foot swollen at joint below great toe    Leg Pain   The incident occurred 3 to 6 hours ago. The incident occurred at home. The quality of the pain is described as aching and shooting. The pain is at a severity of 8/10. The pain is moderate. The pain has been constant since onset. Associated symptoms include an inability to bear weight and a loss of motion. Pertinent negatives include no loss of sensation, muscle weakness, numbness or tingling. The symptoms are aggravated by movement, palpation and weight bearing. She has tried acetaminophen for the symptoms. The treatment provided moderate relief.    No problem-specific Assessment & Plan notes found for this encounter.   Past Medical History:  Diagnosis Date  . Hypertension     Past Surgical History:  Procedure Laterality Date  . COLONOSCOPY  2012   cleared for 5 yrs- [place]    Family History  Problem Relation Age of Onset  . Diabetes Mother     Social History   Social History  . Marital status: Divorced    Spouse name: N/A  . Number of children: N/A  . Years of education: N/A   Occupational History  . Not on file.   Social History Main Topics  . Smoking status: Current Every Day Smoker    Packs/day: 1.00    Types: Cigarettes  . Smokeless tobacco: Not on file  . Alcohol use 0.0 oz/week  . Drug use: No  . Sexual activity: Yes   Other Topics Concern  . Not on file   Social History Narrative  . No narrative on file    No Known Allergies   Review of Systems  Constitutional: Negative for chills, fever, malaise/fatigue and weight loss.  HENT: Negative for ear discharge, ear pain and sore throat.   Eyes: Negative for blurred vision.  Respiratory: Negative for cough, sputum production, shortness of breath and wheezing.    Cardiovascular: Negative for chest pain, palpitations and leg swelling.  Gastrointestinal: Negative for abdominal pain, blood in stool, constipation, diarrhea, heartburn, melena and nausea.  Genitourinary: Negative for dysuria, frequency, hematuria and urgency.  Musculoskeletal: Positive for joint pain. Negative for back pain, myalgias and neck pain.  Skin: Negative for rash.  Neurological: Negative for dizziness, tingling, sensory change, focal weakness, numbness and headaches.  Endo/Heme/Allergies: Negative for environmental allergies and polydipsia. Does not bruise/bleed easily.  Psychiatric/Behavioral: Negative for depression and suicidal ideas. The patient is not nervous/anxious and does not have insomnia.      Objective  Vitals:   10/09/15 0941  BP: (!) 148/110  Pulse: 78  Weight: 119 lb (54 kg)  Height: 5\' 1"  (1.549 m)    Physical Exam  Constitutional: She is well-developed, well-nourished, and in no distress. No distress.  HENT:  Head: Normocephalic and atraumatic.  Right Ear: External ear normal.  Left Ear: External ear normal.  Nose: Nose normal.  Mouth/Throat: Uvula is midline and oropharynx is clear and moist.  Cerumen impaction bilateral  Eyes: Conjunctivae and EOM are normal. Pupils are equal, round, and reactive to light. Right eye exhibits no discharge. Left eye exhibits no discharge.  Neck: Normal range of motion. Neck supple. No JVD present. No thyromegaly present.  Cardiovascular: Normal rate, regular rhythm, normal  heart sounds and intact distal pulses.  Exam reveals no gallop and no friction rub.   No murmur heard. Pulmonary/Chest: Effort normal and breath sounds normal.  Abdominal: Soft. Bowel sounds are normal. She exhibits no mass. There is no tenderness. There is no guarding.  Musculoskeletal: She exhibits no edema.       Right foot: There is decreased range of motion and tenderness. There is no deformity.  Lymphadenopathy:    She has no cervical  adenopathy.  Neurological: She is alert.  Skin: Skin is warm and dry. She is not diaphoretic.  Psychiatric: Mood and affect normal.  Nursing note and vitals reviewed.     Assessment & Plan  Problem List Items Addressed This Visit    None    Visit Diagnoses    Acute idiopathic gout of right foot    -  Primary   Relevant Medications   colchicine 0.6 MG tablet   indomethacin (INDOCIN) 50 MG capsule   Cerumen impaction, bilateral            Dr. Hayden [facility]  10/09/15

## 2015-11-06 ENCOUNTER — Ambulatory Visit (INDEPENDENT_AMBULATORY_CARE_PROVIDER_SITE_OTHER): Payer: BC Managed Care – PPO | Admitting: Family Medicine

## 2015-11-06 ENCOUNTER — Encounter: Payer: Self-pay | Admitting: Family Medicine

## 2015-11-06 VITALS — BP 130/98 | HR 88 | Ht 61.0 in | Wt 121.0 lb

## 2015-11-06 DIAGNOSIS — E785 Hyperlipidemia, unspecified: Secondary | ICD-10-CM

## 2015-11-06 DIAGNOSIS — Z0000 Encounter for general adult medical examination without abnormal findings: Secondary | ICD-10-CM | POA: Diagnosis not present

## 2015-11-06 DIAGNOSIS — I10 Essential (primary) hypertension: Secondary | ICD-10-CM

## 2015-11-06 DIAGNOSIS — Z1239 Encounter for other screening for malignant neoplasm of breast: Secondary | ICD-10-CM

## 2015-11-06 NOTE — Progress Notes (Signed)
Name: Samantha Irwin   MRN: [id number]    DOB: [birth year]   Date:11/06/2015       Progress Note  Subjective  Chief Complaint  Chief Complaint  Patient presents with  . Gynecologic Exam    no pap    Patient presents for annual physical exam   Gynecologic Exam  The patient's pertinent negatives include no genital itching, genital lesions, genital odor, missed menses, pelvic pain, vaginal bleeding or vaginal discharge. The problem has been unchanged. She is not pregnant. Pertinent negatives include no abdominal pain, anorexia, back pain, chills, constipation, diarrhea, discolored urine, dysuria, fever, flank pain, frequency, headaches, hematuria, joint pain, joint swelling, nausea, painful intercourse, rash, sore throat, urgency or vomiting. There is no history of an abdominal surgery, a gynecological surgery, menorrhagia, PID or an STD.    No problem-specific Assessment & Plan notes found for this encounter.   Past Medical History:  Diagnosis Date  . Hypertension     Past Surgical History:  Procedure Laterality Date  . COLONOSCOPY  2012   cleared for 5 yrs- [place]    Family History  Problem Relation Age of Onset  . Diabetes Mother     Social History   Social History  . Marital status: Divorced    Spouse name: N/A  . Number of children: N/A  . Years of education: N/A   Occupational History  . Not on file.   Social History Main Topics  . Smoking status: Current Every Day Smoker    Packs/day: 1.00    Types: Cigarettes  . Smokeless tobacco: Not on file  . Alcohol use 0.0 oz/week  . Drug use: No  . Sexual activity: Yes   Other Topics Concern  . Not on file   Social History Narrative  . No narrative on file    No Known Allergies   Review of Systems  Constitutional: Negative for chills, fever, malaise/fatigue and weight loss.  HENT: Negative for ear discharge, ear pain and sore throat.   Eyes: Negative for blurred vision.  Respiratory: Negative for  cough, sputum production, shortness of breath and wheezing.   Cardiovascular: Negative for chest pain, palpitations and leg swelling.  Gastrointestinal: Negative for abdominal pain, anorexia, blood in stool, constipation, diarrhea, heartburn, melena, nausea and vomiting.  Genitourinary: Negative for dysuria, flank pain, frequency, hematuria, menorrhagia, missed menses, pelvic pain, urgency and vaginal discharge.  Musculoskeletal: Negative for back pain, joint pain, myalgias and neck pain.  Skin: Negative for rash.  Neurological: Negative for dizziness, tingling, sensory change, focal weakness and headaches.  Endo/Heme/Allergies: Negative for environmental allergies and polydipsia. Does not bruise/bleed easily.  Psychiatric/Behavioral: Negative for depression and suicidal ideas. The patient is not nervous/anxious and does not have insomnia.      Objective  Vitals:   11/06/15 0931  BP: (!) 130/98  Pulse: 88  Weight: 121 lb (54.9 kg)  Height: 5\' 1"  (1.549 m)    Physical Exam  Constitutional: She is well-developed, well-nourished, and in no distress. No distress.  HENT:  Head: Normocephalic and atraumatic.  Right Ear: External ear normal.  Left Ear: External ear normal.  Nose: Nose normal.  Mouth/Throat: Oropharynx is clear and moist.  Eyes: Conjunctivae and EOM are normal. Pupils are equal, round, and reactive to light. Right eye exhibits no discharge. Left eye exhibits no discharge.  Neck: Normal range of motion. Neck supple. No JVD present. No thyromegaly present.  Cardiovascular: Normal rate, regular rhythm, normal heart sounds and intact distal pulses.  Exam  reveals no gallop and no friction rub.   No murmur heard. Pulmonary/Chest: Effort normal and breath sounds normal. She has no wheezes. She has no rales. Right breast exhibits no inverted nipple, no mass, no nipple discharge, no skin change and no tenderness. Left breast exhibits no inverted nipple, no mass, no nipple discharge,  no skin change and no tenderness. Breasts are symmetrical.  Abdominal: Soft. Bowel sounds are normal. She exhibits no mass. There is no tenderness. There is no guarding.  Musculoskeletal: Normal range of motion. She exhibits no edema.  Lymphadenopathy:    She has no cervical adenopathy.  Neurological: She is alert. She has normal reflexes.  Skin: Skin is warm and dry. She is not diaphoretic.  Psychiatric: Mood and affect normal.  Nursing note and vitals reviewed.     Assessment & Plan  Problem List Items Addressed This Visit      Cardiovascular and Mediastinum   Essential hypertension     Other   Annual physical exam - Primary    Other Visit Diagnoses    Hyperlipidemia       Relevant Orders   Lipid Profile   Breast cancer screening       Relevant Orders   MM Digital Screening        Dr. Elizabeth Sauereanna Jones [facility]  11/06/15

## 2015-11-25 ENCOUNTER — Ambulatory Visit: Admission: RE | Admit: 2015-11-25 | Payer: BC Managed Care – PPO | Source: Ambulatory Visit

## 2015-12-02 ENCOUNTER — Other Ambulatory Visit: Payer: Self-pay | Admitting: Family Medicine

## 2015-12-02 ENCOUNTER — Ambulatory Visit
Admission: RE | Admit: 2015-12-02 | Discharge: 2015-12-02 | Disposition: A | Discharge status: Home / Self Care | Payer: BC Managed Care – PPO | Source: Ambulatory Visit | Attending: Family Medicine | Admitting: Family Medicine

## 2015-12-02 DIAGNOSIS — Z1231 Encounter for screening mammogram for malignant neoplasm of breast: Secondary | ICD-10-CM | POA: Diagnosis not present

## 2015-12-02 DIAGNOSIS — Z1239 Encounter for other screening for malignant neoplasm of breast: Secondary | ICD-10-CM

## 2015-12-26 ENCOUNTER — Other Ambulatory Visit: Payer: Self-pay

## 2016-03-15 ENCOUNTER — Other Ambulatory Visit: Payer: Self-pay | Admitting: Family Medicine

## 2016-03-15 DIAGNOSIS — I10 Essential (primary) hypertension: Secondary | ICD-10-CM

## 2016-03-22 ENCOUNTER — Other Ambulatory Visit: Payer: Self-pay

## 2016-03-23 ENCOUNTER — Ambulatory Visit
Admission: RE | Admit: 2016-03-23 | Discharge: 2016-03-23 | Disposition: A | Discharge status: Home / Self Care | Payer: BC Managed Care – PPO | Source: Ambulatory Visit | Attending: Family Medicine | Admitting: Family Medicine

## 2016-03-23 ENCOUNTER — Ambulatory Visit (INDEPENDENT_AMBULATORY_CARE_PROVIDER_SITE_OTHER): Payer: BC Managed Care – PPO | Admitting: Family Medicine

## 2016-03-23 ENCOUNTER — Encounter: Payer: Self-pay | Admitting: Family Medicine

## 2016-03-23 VITALS — BP 154/88 | HR 88 | Temp 98.3°F | Ht 61.5 in | Wt 125.0 lb

## 2016-03-23 DIAGNOSIS — R0602 Shortness of breath: Secondary | ICD-10-CM | POA: Diagnosis not present

## 2016-03-23 DIAGNOSIS — J440 Chronic obstructive pulmonary disease with acute lower respiratory infection: Secondary | ICD-10-CM | POA: Diagnosis not present

## 2016-03-23 DIAGNOSIS — R05 Cough: Secondary | ICD-10-CM | POA: Diagnosis present

## 2016-03-23 DIAGNOSIS — J209 Acute bronchitis, unspecified: Secondary | ICD-10-CM

## 2016-03-23 DIAGNOSIS — J449 Chronic obstructive pulmonary disease, unspecified: Secondary | ICD-10-CM

## 2016-03-23 DIAGNOSIS — I700 Atherosclerosis of aorta: Secondary | ICD-10-CM

## 2016-03-23 DIAGNOSIS — R0600 Dyspnea, unspecified: Secondary | ICD-10-CM | POA: Diagnosis present

## 2016-03-23 MED ORDER — ALBUTEROL SULFATE HFA 108 (90 BASE) MCG/ACT IN AERS: 2.0000 | INHALATION_SPRAY | Freq: Four times a day (QID) | RESPIRATORY_TRACT | 0 refills | Status: DC | PRN

## 2016-03-23 MED ORDER — LEVOFLOXACIN 500 MG PO TABS: 500.0000 mg | ORAL_TABLET | Freq: Every day | ORAL | 0 refills | Status: DC

## 2016-03-23 MED ORDER — GUAIFENESIN-CODEINE 100-10 MG/5ML PO SYRP
5.0000 mL | ORAL_SOLUTION | Freq: Three times a day (TID) | ORAL | 0 refills | Status: DC | PRN
Start: 2016-03-23 — End: 2016-06-30

## 2016-03-23 NOTE — Patient Instructions (Signed)
Metered Dose Inhaler With Spacer Inhaled medicines are the basis of treatment of asthma and other breathing problems. Inhaled medicine can only be effective if used properly. Good technique assures that the medicine reaches the lungs. Your health care provider has asked you to use a spacer with your inhaler to help you take the medicine more effectively. A spacer is a plastic tube with a mouthpiece on one end and an opening that connects to the inhaler on the other end. Metered dose inhalers (MDIs) are used to deliver a variety of inhaled medicines. These include quick relief or rescue medicines (such as bronchodilators) and controller medicines (such as corticosteroids). The medicine is delivered by pushing down on a metal canister to release a set amount of spray. If you are using different kinds of inhalers, use your quick relief medicine to open the airways 10-15 minutes before using a steroid if instructed to do so by your health care provider. If you are unsure which inhalers to use and the order of using them, ask your health care provider, nurse, or respiratory therapist. HOW TO USE THE INHALER WITH A SPACER 1. Remove cap from inhaler. 2. If you are using the inhaler for the first time, you will need to prime it. Shake the inhaler for 5 seconds and release four puffs into the air, away from your face. Ask your health care provider or pharmacist if you have questions about priming your inhaler. 3. Shake inhaler for 5 seconds before each breath in (inhalation). 4. Place the open end of the spacer onto the mouthpiece of the inhaler. 5. Position the inhaler so that the top of the canister faces up and the spacer mouthpiece faces you. 6. Put your index finger on the top of the medicine canister. Your thumb supports the bottom of the inhaler and the spacer. 7. Breathe out (exhale) normally and as completely as possible. 8. Immediately after exhaling, place the spacer between your teeth and into your  mouth. Close your mouth tightly around the spacer. 9. Press the canister down with the index finger to release the medicine. 10. At the same time as the canister is pressed, inhale deeply and slowly until the lungs are completely filled. This should take 4-6 seconds. Keep your tongue down and out of the way. 11. Hold the medicine in your lungs for 5-10 seconds (10 seconds is best). This helps the medicine get into the small airways of your lungs. Exhale. 12. Repeat inhaling deeply through the spacer mouthpiece. Again hold that breath for up to 10 seconds (10 seconds is best). Exhale slowly. If it is difficult to take this second deep breath through the spacer, breathe normally several times through the spacer. Remove the spacer from your mouth. 13. Wait at least 15-30 seconds between puffs. Continue with the above steps until you have taken the number of puffs your health care provider has ordered. Do not use the inhaler more than your health care provider directs you to. 14. Remove spacer from the inhaler and place cap on inhaler. 15. Follow the directions from your health care provider or the inhaler insert for cleaning the inhaler and spacer. If you are using a steroid inhaler, rinse your mouth with water after your last puff, gargle, and spit out the water. Do not swallow the water. AVOID:   Inhaling before or after starting the spray of medicine. It takes practice to coordinate your breathing with triggering the spray.  Inhaling through the nose (rather than the mouth) when   triggering the spray. HOW TO DETERMINE IF YOUR INHALER IS FULL OR NEARLY EMPTY You cannot know when an inhaler is empty by shaking it. A few inhalers are now being made with dose counters. Ask your health care provider for a prescription that has a dose counter if you feel you need that extra help. If your inhaler does not have a counter, ask your health care provider to help you determine the date you need to refill your  inhaler. Write the refill date on a calendar or your inhaler canister. Refill your inhaler 7-10 days before it runs out. Be sure to keep an adequate supply of medicine. This includes making sure it is not expired, and you have a spare inhaler.  SEEK MEDICAL CARE IF:   Symptoms are only partially relieved with your inhaler.  You are having trouble using your inhaler.  You experience some increase in phlegm. SEEK IMMEDIATE MEDICAL CARE IF:   You feel little or no relief with your inhalers. You are still wheezing and are feeling shortness of breath or tightness in your chest or both.  You have dizziness, headaches, or fast heart rate.  You have chills, fever, or night sweats.  There is a noticeable increase in phlegm production, or there is blood in the phlegm. This information is not intended to replace advice given to you by your health care provider. Make sure you discuss any questions you have with your health care provider. Document Released: 01/25/2005 Document Revised: 06/11/2014 Document Reviewed: 07/13/2012 Elsevier Interactive Patient Education  2017 [employer]  

## 2016-03-23 NOTE — Progress Notes (Signed)
Name: Samantha Irwin   MRN: [id number]    DOB: 1960/08/17   Date:03/23/2016       Progress Note  Subjective  Chief Complaint  Chief Complaint  Patient presents with  . Cough    chest congestion x 10 days. Pt states she is having some SOB.     Cough  This is a new problem. The current episode started in the past 7 days. The problem has been gradually worsening. The problem occurs hourly. The cough is productive of purulent sputum (yellow). Associated symptoms include ear congestion, headaches, nasal congestion, postnasal drip and wheezing. Pertinent negatives include no chest pain, chills, ear pain, fever, heartburn, hemoptysis, myalgias, rash, rhinorrhea, sore throat, shortness of breath, sweats or weight loss. Risk factors for lung disease include smoking/tobacco exposure. She has tried a beta-agonist inhaler for the symptoms. The treatment provided moderate relief. Her past medical history is significant for COPD. There is no history of environmental allergies.  Shortness of Breath  This is a chronic problem. The current episode started in the past 7 days. The problem has been gradually worsening. Associated symptoms include headaches and wheezing. Pertinent negatives include no abdominal pain, chest pain, ear pain, fever, hemoptysis, leg swelling, neck pain, rash, rhinorrhea, sore throat or sputum production. The symptoms are aggravated by URIs. Her past medical history is significant for COPD.    No problem-specific Assessment & Plan notes found for this encounter.   Past Medical History:  Diagnosis Date  . Hypertension     Past Surgical History:  Procedure Laterality Date  . COLONOSCOPY  2012   cleared for 5 yrs- [place]    Family History  Problem Relation Age of Onset  . Diabetes Mother   . Breast cancer Neg Hx     Social History   Social History  . Marital status: Divorced    Spouse name: N/A  . Number of children: N/A  . Years of education: N/A    Occupational History  . Not on file.   Social History Main Topics  . Smoking status: Current Every Day Smoker    Packs/day: 1.00    Types: Cigarettes  . Smokeless tobacco: Not on file  . Alcohol use 0.0 oz/week  . Drug use: No  . Sexual activity: Yes   Other Topics Concern  . Not on file   Social History Narrative  . No narrative on file    No Known Allergies   Review of Systems  Constitutional: Negative for chills, fever, malaise/fatigue and weight loss.  HENT: Positive for postnasal drip. Negative for ear discharge, ear pain, rhinorrhea and sore throat.   Eyes: Negative for blurred vision.  Respiratory: Positive for cough and wheezing. Negative for hemoptysis, sputum production and shortness of breath.   Cardiovascular: Negative for chest pain, palpitations and leg swelling.  Gastrointestinal: Negative for abdominal pain, blood in stool, constipation, diarrhea, heartburn, melena and nausea.  Genitourinary: Negative for dysuria, frequency, hematuria and urgency.  Musculoskeletal: Negative for back pain, joint pain, myalgias and neck pain.  Skin: Negative for rash.  Neurological: Positive for headaches. Negative for dizziness, tingling, sensory change and focal weakness.  Endo/Heme/Allergies: Negative for environmental allergies and polydipsia. Does not bruise/bleed easily.  Psychiatric/Behavioral: Negative for depression and suicidal ideas. The patient is not nervous/anxious and does not have insomnia.      Objective  Vitals:   03/23/16 1541  BP: (!) 154/88  Pulse: 88  Temp: 98.3 F (36.8 C)  Weight: 125 lb (56.7 kg)  Height: 5' 1.5" (1.562 m)    Physical Exam  Constitutional: She is well-developed, well-nourished, and in no distress. No distress.  HENT:  Head: Normocephalic and atraumatic.  Right Ear: External ear normal.  Left Ear: External ear normal.  Nose: Nose normal.  Mouth/Throat: Oropharynx is clear and moist.  Eyes: Conjunctivae and EOM are  normal. Pupils are equal, round, and reactive to light. Right eye exhibits no discharge. Left eye exhibits no discharge.  Neck: Normal range of motion. Neck supple. No JVD present. No thyromegaly present.  Cardiovascular: Normal rate, regular rhythm, normal heart sounds and intact distal pulses.  Exam reveals no gallop and no friction rub.   No murmur heard. Pulmonary/Chest: Effort normal. She has decreased breath sounds in the right lower field and the left lower field. She has wheezes. She has no rales.  Abdominal: Soft. Bowel sounds are normal. She exhibits no mass. There is no tenderness. There is no guarding.  Musculoskeletal: Normal range of motion. She exhibits no edema.  Lymphadenopathy:    She has no cervical adenopathy.  Neurological: She is alert. She has normal reflexes.  Skin: Skin is warm and dry. She is not diaphoretic.  Psychiatric: Mood and affect normal.  Nursing note and vitals reviewed.     Assessment & Plan  Problem List Items Addressed This Visit    None    Visit Diagnoses    Acute bronchitis with COPD (HCC)    -  Primary   Relevant Medications   levofloxacin (LEVAQUIN) 500 MG tablet   guaiFENesin-codeine (ROBITUSSIN AC) 100-10 MG/5ML syrup   albuterol (PROVENTIL HFA;VENTOLIN HFA) 108 (90 Base) MCG/ACT inhaler   Other Relevant Orders   DG Chest 2 View   Chronic obstructive pulmonary disease, unspecified COPD type (HCC)       Relevant Medications   guaiFENesin-codeine (ROBITUSSIN AC) 100-10 MG/5ML syrup   albuterol (PROVENTIL HFA;VENTOLIN HFA) 108 (90 Base) MCG/ACT inhaler   Shortness of breath       Relevant Medications   albuterol (PROVENTIL HFA;VENTOLIN HFA) 108 (90 Base) MCG/ACT inhaler        Dr. Hayden Rasmusseneanna Armando Bukhari [facility]  03/23/16

## 2016-04-22 ENCOUNTER — Other Ambulatory Visit: Payer: Self-pay | Admitting: Family Medicine

## 2016-04-22 DIAGNOSIS — I10 Essential (primary) hypertension: Secondary | ICD-10-CM

## 2016-06-05 ENCOUNTER — Other Ambulatory Visit: Payer: Self-pay | Admitting: Family Medicine

## 2016-06-05 DIAGNOSIS — I10 Essential (primary) hypertension: Secondary | ICD-10-CM

## 2016-06-07 ENCOUNTER — Other Ambulatory Visit: Payer: Self-pay

## 2016-06-25 ENCOUNTER — Other Ambulatory Visit: Payer: Self-pay

## 2016-06-25 MED ORDER — TRIAMCINOLONE ACETONIDE 0.5 % EX OINT: 1.0000 "application " | TOPICAL_OINTMENT | Freq: Two times a day (BID) | CUTANEOUS | 0 refills | Status: DC

## 2016-06-28 ENCOUNTER — Ambulatory Visit: Payer: BC Managed Care – PPO | Admitting: Family Medicine

## 2016-06-30 ENCOUNTER — Ambulatory Visit
Admission: EM | Admit: 2016-06-30 | Discharge: 2016-06-30 | Disposition: A | Discharge status: Home / Self Care | Payer: BC Managed Care – PPO | Attending: Family Medicine | Admitting: Family Medicine

## 2016-06-30 DIAGNOSIS — R51 Headache: Secondary | ICD-10-CM | POA: Diagnosis not present

## 2016-06-30 DIAGNOSIS — R519 Headache, unspecified: Secondary | ICD-10-CM

## 2016-06-30 LAB — CBC WITH DIFFERENTIAL/PLATELET
BASOS ABS: 0.1 10*3/uL (ref 0–0.1)
BASOS PCT: 1 %
EOS ABS: 0.3 10*3/uL (ref 0–0.7)
EOS PCT: 6 %
HCT: 38.9 % (ref 35.0–47.0)
Hemoglobin: 12.6 g/dL (ref 12.0–16.0)
LYMPHS ABS: 1.3 10*3/uL (ref 1.0–3.6)
Lymphocytes Relative: 28 %
MCH: 29.2 pg (ref 26.0–34.0)
MCHC: 32.5 g/dL (ref 32.0–36.0)
MCV: 90 fL (ref 80.0–100.0)
Monocytes Absolute: 0.6 10*3/uL (ref 0.2–0.9)
Monocytes Relative: 12 %
NEUTROS PCT: 53 %
Neutro Abs: 2.5 10*3/uL (ref 1.4–6.5)
PLATELETS: 262 10*3/uL (ref 150–440)
RBC: 4.32 MIL/uL (ref 3.80–5.20)
RDW: 12.9 % (ref 11.5–14.5)
WBC: 4.8 10*3/uL (ref 3.6–11.0)

## 2016-06-30 LAB — C-REACTIVE PROTEIN

## 2016-06-30 LAB — SEDIMENTATION RATE: Sed Rate: 40 mm/hr — ABNORMAL HIGH (ref 0–30)

## 2016-06-30 MED ORDER — HYDROCODONE-ACETAMINOPHEN 5-325 MG PO TABS: ORAL_TABLET | ORAL | 0 refills | Status: DC

## 2016-06-30 MED ORDER — KETOROLAC TROMETHAMINE 60 MG/2ML IM SOLN
60.0000 mg | Freq: Once | INTRAMUSCULAR | Status: AC
  Administered 2016-06-30: 60 mg via INTRAMUSCULAR

## 2016-06-30 MED ORDER — PREDNISONE 20 MG PO TABS: ORAL_TABLET | ORAL | 0 refills | Status: DC

## 2016-06-30 NOTE — ED Provider Notes (Signed)
[facility]    CSN: [id number] Arrival date & time: 06/30/16  1527     History   Chief Complaint Chief Complaint  Patient presents with  . Headache    HPI Samantha Irwin is a 56 y.o. female.   The history is provided by the patient.  Headache  Pain location:  L temporal Quality:  Dull (throbbing) Radiates to:  Does not radiate Severity currently:  7/10 Severity at highest:  7/10 Onset quality:  Sudden Duration:  2 days Timing:  Constant Progression:  Waxing and waning Chronicity:  New Similar to prior headaches: no   Context: not activity, not exposure to bright light, not caffeine, not coughing, not defecating, not eating, not stress, not exposure to cold air, not intercourse, not loud noise and not straining   Relieved by:  Nothing Ineffective treatments:  NSAIDs Associated symptoms: no abdominal pain, no back pain, no blurred vision, no congestion, no cough, no diarrhea, no dizziness, no drainage, no ear pain, no eye pain, no facial pain, no fatigue, no fever, no focal weakness, no hearing loss, no loss of balance, no myalgias, no nausea, no near-syncope, no neck pain, no neck stiffness, no numbness, no paresthesias, no photophobia, no seizures, no sinus pressure, no sore throat, no swollen glands, no syncope, no tingling, no URI, no visual change, no vomiting and no weakness   Risk factors: no anger, no family hx of SAH, does not have insomnia and lifestyle not sedentary     Past Medical History:  Diagnosis Date  . Hypertension     Patient Active Problem List   Diagnosis Date Noted  . Essential hypertension 06/12/2015  . Annual physical exam 08/05/2014    Past Surgical History:  Procedure Laterality Date  . COLONOSCOPY  2012   cleared for 5 yrs- [place]    OB History    No data available       Home Medications    Prior to Admission medications   Medication Sig Start Date End Date Taking? Authorizing Provider  amLODipine (NORVASC)  5 MG tablet TAKE 1 TABLET BY MOUTH ONCE DAILY 06/07/16  Yes Duanne Limerick, MD  cholecalciferol (VITAMIN D) 1000 UNITS tablet Take 1,000 Units by mouth daily.   Yes [provider]  cyclobenzaprine (FLEXERIL) 10 MG tablet Take 1 tablet (10 mg total) by mouth at bedtime. 07/18/15  Yes Payton Mccallum, MD  Multiple Vitamins-Minerals (MULTIVITAMIN WOMEN 50+ PO) Take 1 tablet by mouth daily.   Yes [provider]  Omega-3 Fatty Acids (FISH OIL) 1000 MG CAPS Take 1 capsule by mouth daily.   Yes [provider]  vitamin C (ASCORBIC ACID) 250 MG tablet Take 250 mg by mouth daily.   Yes [provider]  albuterol (PROVENTIL HFA;VENTOLIN HFA) 108 (90 Base) MCG/ACT inhaler Inhale 2 puffs into the lungs every 6 (six) hours as needed for wheezing or shortness of breath. 03/23/16   Duanne Limerick, MD  HYDROcodone-acetaminophen (NORCO/VICODIN) 5-325 MG tablet 1-2 tabs po qd prn 06/30/16   Payton Mccallum, MD  predniSONE (DELTASONE) 20 MG tablet 3 tabs po qd 06/30/16   Payton Mccallum, MD  triamcinolone ointment (KENALOG) 0.5 % Apply 1 application topically 2 (two) times daily. 06/25/16   Duanne Limerick, MD    Family History Family History  Problem Relation Age of Onset  . Diabetes Mother   . Breast cancer Neg Hx     Social History Social History  Substance Use Topics  . Smoking  status: Current Every Day Smoker    Packs/day: 1.00    Types: Cigarettes  . Smokeless tobacco: Never Used  . Alcohol use 0.0 oz/week     Allergies   Patient has no known allergies.   Review of Systems Review of Systems  Constitutional: Negative for fatigue and fever.  HENT: Negative for congestion, ear pain, hearing loss, postnasal drip, sinus pressure and sore throat.   Eyes: Negative for blurred vision, photophobia and pain.  Respiratory: Negative for cough.   Cardiovascular: Negative for syncope and near-syncope.  Gastrointestinal: Negative for abdominal pain, diarrhea, nausea and  vomiting.  Musculoskeletal: Negative for back pain, myalgias, neck pain and neck stiffness.  Neurological: Positive for headaches. Negative for dizziness, focal weakness, seizures, weakness, numbness, paresthesias and loss of balance.     Physical Exam Triage Vital Signs ED Triage Vitals [06/30/16 1604]  Enc Vitals Group     BP 136/70     Pulse Rate 81     Resp 18     Temp 97.9 F (36.6 C)     Temp Source Oral     SpO2 100 %     Weight 123 lb (55.8 kg)     Height 5' 1.5" (1.562 m)     Head Circumference      Peak Flow      Pain Score 9     Pain Loc      Pain Edu?      Excl. in GC?    No data found.   Updated Vital Signs BP 136/70 (BP Location: Left Arm)   Pulse 81   Temp 97.9 F (36.6 C) (Oral)   Resp 18   Ht 5' 1.5" (1.562 m)   Wt 123 lb (55.8 kg)   SpO2 100%   BMI 22.86 kg/m   Visual Acuity Right Eye Distance:   Left Eye Distance:   Bilateral Distance:    Right Eye Near:   Left Eye Near:    Bilateral Near:     Physical Exam  Constitutional: She is oriented to person, place, and time. She appears well-developed and well-nourished. No distress.  HENT:  Head: Normocephalic and atraumatic.  Right Ear: Tympanic membrane, external ear and ear canal normal.  Left Ear: Tympanic membrane, external ear and ear canal normal.  Nose: Nose normal.  Mouth/Throat: Oropharynx is clear and moist and mucous membranes are normal.  Tenderness to palpation over the left temporal area  Eyes: Conjunctivae and EOM are normal. Pupils are equal, round, and reactive to light. Right eye exhibits no discharge. Left eye exhibits no discharge. No scleral icterus.  Neck: Normal range of motion. Neck supple. No JVD present. No tracheal deviation present. No thyromegaly present.  Cardiovascular: Normal rate, regular rhythm, normal heart sounds and intact distal pulses.   No murmur heard. Pulmonary/Chest: Effort normal and breath sounds normal. No stridor. No respiratory distress. She  has no wheezes. She has no rales. She exhibits no tenderness.  Musculoskeletal: She exhibits no edema.       Cervical back: She exhibits tenderness and spasm. She exhibits normal range of motion, no bony tenderness, no swelling, no edema, no deformity, no laceration and normal pulse.  Lymphadenopathy:    She has no cervical adenopathy.  Neurological: She is alert and oriented to person, place, and time. She has normal reflexes. She displays normal reflexes. No cranial nerve deficit or sensory deficit. She exhibits normal muscle tone. Coordination normal.  Skin: Skin is warm and dry. No rash  noted. She is not diaphoretic. No erythema. No pallor.  Psychiatric: She has a normal mood and affect. Her behavior is normal. Judgment and thought content normal.  Nursing note and vitals reviewed.    UC Treatments / Results  Labs (all labs ordered are listed, but only abnormal results are displayed) Labs Reviewed  SEDIMENTATION RATE - Abnormal; Notable for the following:       Result Value   Sed Rate 40 (*)    All other components within normal limits  CBC WITH DIFFERENTIAL/PLATELET  C-REACTIVE PROTEIN    EKG  EKG Interpretation None       Radiology No results found.  Procedures Procedures (including critical care time)  Medications Ordered in UC Medications  ketorolac (TORADOL) injection 60 mg (60 mg Intramuscular Given 06/30/16 1740)     Initial Impression / Assessment and Plan / UC Course  I have reviewed the triage vital signs and the nursing notes.  Pertinent labs & imaging results that were available during my care of the patient were reviewed by me and considered in my medical decision making (see chart for details).       Final Clinical Impressions(s) / UC Diagnoses   Final diagnoses:  Left temporal headache    New Prescriptions Discharge Medication List as of 06/30/2016  6:29 PM    START taking these medications   Details  HYDROcodone-acetaminophen  (NORCO/VICODIN) 5-325 MG tablet 1-2 tabs po qd prn, Print    predniSONE (DELTASONE) 20 MG tablet 3 tabs po qd, Normal       1. Lab results and possible diagnosis reviewed with patient 2. rx as per orders above; reviewed possible side effects, interactions, risks and benefits  3. Recommend follow up with neurologist and/or general surgeon for possible temporal artery biopsy 4. Follow-up prn if symptoms worsen or don't improve   Payton Mccallum, MD 06/30/16 2143

## 2016-06-30 NOTE — Discharge Instructions (Signed)
Recommend Surgery or Neurology follow up this week for possible temporal artery biopsy

## 2016-06-30 NOTE — ED Triage Notes (Signed)
Pt has sharp pain in her left temporal area, it woke her up last night. It comes and goes and has done it all day.

## 2016-07-01 ENCOUNTER — Emergency Department: Payer: BC Managed Care – PPO

## 2016-07-01 ENCOUNTER — Other Ambulatory Visit: Payer: Self-pay

## 2016-07-01 ENCOUNTER — Ambulatory Visit (INDEPENDENT_AMBULATORY_CARE_PROVIDER_SITE_OTHER): Payer: BC Managed Care – PPO | Admitting: Family Medicine

## 2016-07-01 ENCOUNTER — Encounter: Payer: Self-pay | Admitting: Family Medicine

## 2016-07-01 ENCOUNTER — Emergency Department
Admission: EM | Admit: 2016-07-01 | Discharge: 2016-07-01 | Disposition: A | Discharge status: Home / Self Care | Payer: BC Managed Care – PPO | Attending: Emergency Medicine | Admitting: Emergency Medicine

## 2016-07-01 ENCOUNTER — Encounter: Payer: Self-pay | Admitting: Emergency Medicine

## 2016-07-01 VITALS — BP 120/72 | HR 80 | Ht 61.5 in | Wt 128.0 lb

## 2016-07-01 DIAGNOSIS — R519 Headache, unspecified: Secondary | ICD-10-CM

## 2016-07-01 DIAGNOSIS — J329 Chronic sinusitis, unspecified: Secondary | ICD-10-CM | POA: Insufficient documentation

## 2016-07-01 DIAGNOSIS — J0110 Acute frontal sinusitis, unspecified: Secondary | ICD-10-CM

## 2016-07-01 DIAGNOSIS — F17210 Nicotine dependence, cigarettes, uncomplicated: Secondary | ICD-10-CM | POA: Insufficient documentation

## 2016-07-01 DIAGNOSIS — R51 Headache: Secondary | ICD-10-CM | POA: Diagnosis not present

## 2016-07-01 DIAGNOSIS — I10 Essential (primary) hypertension: Secondary | ICD-10-CM | POA: Diagnosis not present

## 2016-07-01 DIAGNOSIS — Z79899 Other long term (current) drug therapy: Secondary | ICD-10-CM | POA: Diagnosis not present

## 2016-07-01 MED ORDER — AMOXICILLIN 500 MG PO CAPS: 500.0000 mg | ORAL_CAPSULE | Freq: Three times a day (TID) | ORAL | 0 refills | Status: DC

## 2016-07-01 NOTE — Patient Instructions (Signed)

## 2016-07-01 NOTE — ED Notes (Signed)
Pt reports that she started Tuesday with a sharp pain in her left temple area - she reports that she has had a headache since that time - she reports that she went to urgent care yesterday (dx with arteritis) and then to her PCP today (stated she might have a severe infection going on and sent her to the ER) - pt states that she was told by her PCP that she may need a biopsy after having the CT scan today (pt could not elaborate)

## 2016-07-01 NOTE — Discharge Instructions (Signed)
Please seek medical attention for any high fevers, chest pain, shortness of breath, change in behavior, persistent vomiting, bloody stool or any other new or concerning symptoms.  

## 2016-07-01 NOTE — Progress Notes (Signed)
Name: Samantha Irwin   MRN: [id number]    DOB: 1960-04-22   Date:07/01/2016       Progress Note  Subjective  Chief Complaint  Chief Complaint  Patient presents with  . Follow-up    urgent care- headache waking her up in the middle of night    Neurologic Problem  The patient's pertinent negatives include no altered mental status, clumsiness, focal sensory loss, focal weakness, loss of balance, memory loss, near-syncope, slurred speech, syncope, visual change or weakness. This is a new problem. The current episode started in the past 7 days (tuesday am/ headache all day/ throbbing/sharp/). The problem has been waxing and waning (awoken with headache wed/and this am 4:45) since onset. There was left-sided focality noted. Associated symptoms include fatigue and headaches. Pertinent negatives include no abdominal pain, auditory change, aura, back pain, bladder incontinence, bowel incontinence, chest pain, confusion, diaphoresis, dizziness, fever, light-headedness, nausea, neck pain, palpitations, shortness of breath, vertigo or vomiting. Treatments tried: goody's. The treatment provided mild relief. There is no history of a bleeding disorder, a clotting disorder, a CVA, dementia, head trauma, liver disease, mood changes or seizures.    No problem-specific Assessment & Plan notes found for this encounter.   Past Medical History:  Diagnosis Date  . Hypertension     Past Surgical History:  Procedure Laterality Date  . COLONOSCOPY  2012   cleared for 5 yrs- [place]    Family History  Problem Relation Age of Onset  . Diabetes Mother   . Breast cancer Neg Hx     Social History   Social History  . Marital status: Divorced    Spouse name: N/A  . Number of children: N/A  . Years of education: N/A   Occupational History  . Not on file.   Social History Main Topics  . Smoking status: Current Every Day Smoker    Packs/day: 1.00    Types: Cigarettes  . Smokeless tobacco: Never  Used  . Alcohol use 0.0 oz/week  . Drug use: No  . Sexual activity: Yes   Other Topics Concern  . Not on file   Social History Narrative  . No narrative on file    No Known Allergies  Outpatient Medications Prior to Visit  Medication Sig Dispense Refill  . albuterol (PROVENTIL HFA;VENTOLIN HFA) 108 (90 Base) MCG/ACT inhaler Inhale 2 puffs into the lungs every 6 (six) hours as needed for wheezing or shortness of breath. 1 Inhaler 0  . amLODipine (NORVASC) 5 MG tablet TAKE 1 TABLET BY MOUTH ONCE DAILY 30 tablet 0  . cholecalciferol (VITAMIN D) 1000 UNITS tablet Take 1,000 Units by mouth daily.    Marland Kitchen HYDROcodone-acetaminophen (NORCO/VICODIN) 5-325 MG tablet 1-2 tabs po qd prn 6 tablet 0  . Multiple Vitamins-Minerals (MULTIVITAMIN WOMEN 50+ PO) Take 1 tablet by mouth daily.    . Omega-3 Fatty Acids (FISH OIL) 1000 MG CAPS Take 1 capsule by mouth daily.    . predniSONE (DELTASONE) 20 MG tablet 3 tabs po qd 15 tablet 0  . triamcinolone ointment (KENALOG) 0.5 % Apply 1 application topically 2 (two) times daily. 30 g 0  . vitamin C (ASCORBIC ACID) 250 MG tablet Take 250 mg by mouth daily.    . cyclobenzaprine (FLEXERIL) 10 MG tablet Take 1 tablet (10 mg total) by mouth at bedtime. (Patient not taking: Reported on 07/01/2016) 30 tablet 0   No facility-administered medications prior to visit.     Review of Systems  Constitutional: Positive for  fatigue. Negative for chills, diaphoresis, fever, malaise/fatigue and weight loss.  HENT: Positive for congestion. Negative for ear discharge, ear pain, sinus pain and sore throat.   Eyes: Negative for blurred vision, photophobia and pain.  Respiratory: Negative for cough, sputum production, shortness of breath and wheezing.   Cardiovascular: Negative for chest pain, palpitations, leg swelling and near-syncope.  Gastrointestinal: Negative for abdominal pain, blood in stool, bowel incontinence, constipation, diarrhea, heartburn, melena, nausea and  vomiting.  Genitourinary: Negative for bladder incontinence, dysuria, frequency, hematuria and urgency.  Musculoskeletal: Negative for back pain, joint pain and neck pain.  Skin: Negative for rash.  Neurological: Positive for headaches. Negative for dizziness, vertigo, tingling, sensory change, focal weakness, syncope, weakness, light-headedness and loss of balance.  Endo/Heme/Allergies: Negative for environmental allergies and polydipsia. Does not bruise/bleed easily.  Psychiatric/Behavioral: Negative for confusion, depression, memory loss and suicidal ideas. The patient is not nervous/anxious and does not have insomnia.      Objective  Vitals:   07/01/16 1445  BP: 120/72  Pulse: 80  Weight: 128 lb (58.1 kg)  Height: 5' 1.5" (1.562 m)    Physical Exam  Constitutional: She is well-developed, well-nourished, and in no distress. No distress.  HENT:  Head: Normocephalic and atraumatic.  Right Ear: Tympanic membrane, external ear and ear canal normal.  Left Ear: Tympanic membrane, external ear and ear canal normal.  Nose: Right sinus exhibits no maxillary sinus tenderness and no frontal sinus tenderness. Left sinus exhibits frontal sinus tenderness. Left sinus exhibits no maxillary sinus tenderness.    Mouth/Throat: Oropharynx is clear and moist.  tenderness  Eyes: Conjunctivae and EOM are normal. Pupils are equal, round, and reactive to light. Right eye exhibits no discharge. Left eye exhibits no discharge.  Neck: Normal range of motion. Neck supple. No JVD present. No thyromegaly present.  Cardiovascular: Normal rate, regular rhythm, normal heart sounds and intact distal pulses.  Exam reveals no gallop and no friction rub.   No murmur heard. Pulmonary/Chest: Effort normal. She has no wheezes. She has no rales.  Abdominal: Soft. Bowel sounds are normal. She exhibits no mass. There is no tenderness. There is no guarding.  Musculoskeletal: Normal range of motion. She exhibits no  edema.  Lymphadenopathy:    She has no cervical adenopathy.  Neurological: She is alert. She has normal motor skills, normal strength, normal reflexes and intact cranial nerves. A sensory deficit is present. No cranial nerve deficit.  Tender left temporalis muscle  Skin: Skin is warm and dry. She is not diaphoretic.  Psychiatric: Mood and affect normal.  Nursing note and vitals reviewed.     Assessment & Plan  Problem List Items Addressed This Visit    None    Visit Diagnoses    Acute intractable headache, unspecified headache type    -  Primary   to er if worsen or continues   Acute non-recurrent frontal sinusitis       mucinex/cont pred   Relevant Medications   amoxicillin (AMOXIL) 500 MG capsule      Meds ordered this encounter  Medications  . amoxicillin (AMOXIL) 500 MG capsule    Sig: Take 1 capsule (500 mg total) by mouth 3 (three) times daily.    Dispense:  30 capsule    Refill:  0      Dr. Elizabeth Sauereanna Jones [facility]  07/01/16

## 2016-07-01 NOTE — ED Provider Notes (Signed)
[facility] Emergency Department Provider Note \ ____________________________________________   I have reviewed the triage vital signs and the nursing notes.   HISTORY  Chief Complaint Headache   History limited by: Not Limited   HPI Samantha Irwin is a 56 y.o. female who presents to the emergency department today because of concerns for continued headache. The patient went to urgent care yesterday and had ESR sent off which was mildly elevated. She was given steroids and pain medication told to follow up with general surgery neurology. She then went and saw her primary care doctor today who diagnosed her with sinusitis and started her on antibiotics. The patient states that the pain is located in the left side of her forehead. It is sharp. It is intermittent. It is been going on for the past 2 days. No vision change.   Past Medical History:  Diagnosis Date  . Hypertension     Patient Active Problem List   Diagnosis Date Noted  . Essential hypertension 06/12/2015  . Annual physical exam 08/05/2014    Past Surgical History:  Procedure Laterality Date  . COLONOSCOPY  2012   cleared for 5 yrs-     Prior to Admission medications   Medication Sig Start Date End Date Taking? Authorizing Provider  albuterol (PROVENTIL HFA;VENTOLIN HFA) 108 (90 Base) MCG/ACT inhaler Inhale 2 puffs into the lungs every 6 (six) hours as needed for wheezing or shortness of breath. 03/23/16   Juline Patch, MD  amLODipine (NORVASC) 5 MG tablet TAKE 1 TABLET BY MOUTH ONCE DAILY 06/07/16   Juline Patch, MD  amoxicillin (AMOXIL) 500 MG capsule Take 1 capsule (500 mg total) by mouth 3 (three) times daily. 07/01/16   Juline Patch, MD  Biotin 10 MG TABS Take 1 tablet by mouth daily.    [provider]  cholecalciferol (VITAMIN D) 1000 UNITS tablet Take 1,000 Units by mouth daily.    [provider]  cyclobenzaprine (FLEXERIL) 10 MG tablet Take 1  tablet (10 mg total) by mouth at bedtime. Patient not taking: Reported on 07/01/2016 07/18/15   Norval Gable, MD  HYDROcodone-acetaminophen (NORCO/VICODIN) 5-325 MG tablet 1-2 tabs po qd prn 06/30/16   Norval Gable, MD  Multiple Vitamins-Minerals (MULTIVITAMIN WOMEN 50+ PO) Take 1 tablet by mouth daily.    [provider]  Omega-3 Fatty Acids (FISH OIL) 1000 MG CAPS Take 1 capsule by mouth daily.    [provider]  predniSONE (DELTASONE) 20 MG tablet 3 tabs po qd 06/30/16   Norval Gable, MD  triamcinolone ointment (KENALOG) 0.5 % Apply 1 application topically 2 (two) times daily. 06/25/16   Juline Patch, MD  vitamin C (ASCORBIC ACID) 250 MG tablet Take 250 mg by mouth daily.    [provider]    Allergies Patient has no known allergies.  Family History  Problem Relation Age of Onset  . Diabetes Mother   . Breast cancer Neg Hx     Social History Social History  Substance Use Topics  . Smoking status: Current Every Day Smoker    Packs/day: 1.00    Types: Cigarettes  . Smokeless tobacco: Never Used  . Alcohol use 0.0 oz/week    Review of Systems Constitutional: No fever/chills Eyes: No visual changes. ENT: No sore throat. Cardiovascular: Denies chest pain. Respiratory: Denies shortness of breath. Gastrointestinal: No abdominal pain.  No nausea, no vomiting.  No diarrhea.   Genitourinary: Negative for dysuria. Musculoskeletal: Negative for back pain.  Skin: Negative for rash. Neurological: Positive for headache. ____________________________________________   PHYSICAL EXAM:  VITAL SIGNS: ED Triage Vitals  Enc Vitals Group     BP 07/01/16 1836 (!) 161/85     Pulse Rate 07/01/16 1836 100     Resp 07/01/16 1836 20     Temp 07/01/16 1836 99 F (37.2 C)     Temp Source 07/01/16 1836 Oral     SpO2 07/01/16 1836 99 %     Weight 07/01/16 1836 124 lb (56.2 kg)     Height 07/01/16 1836 5' 1.5" (1.562 m)     Head Circumference --      Peak Flow  --      Pain Score 07/01/16 1833 9    Constitutional: Alert and oriented. Well appearing and in no distress. Eyes: Conjunctivae are normal.  ENT   Head: Normocephalic and atraumatic.   Nose: No congestion/rhinnorhea.   Mouth/Throat: Mucous membranes are moist.   Neck: No stridor. Hematological/Lymphatic/Immunilogical: No cervical lymphadenopathy. Cardiovascular: Normal rate, regular rhythm.  No murmurs, rubs, or gallops.  Respiratory: Normal respiratory effort without tachypnea nor retractions. Breath sounds are clear and equal bilaterally. No wheezes/rales/rhonchi. Musculoskeletal: Normal range of motion in all extremities.  Neurologic:  Normal speech and language. No gross focal neurologic deficits are appreciated.  Skin:  Skin is warm, dry and intact. No rash noted. Psychiatric: Mood and affect are normal. Speech and behavior are normal. Patient exhibits appropriate insight and judgment.  ____________________________________________    LABS (pertinent positives/negatives)  None  ____________________________________________   EKG  None  ____________________________________________    RADIOLOGY  CT head IMPRESSION: 1. No acute intracranial abnormality. 2. Aerosolized secretions in left sphenoid sinus may represent acute sinusitis in the appropriate clinical setting. 3. Otherwise unremarkable CT of head.   ____________________________________________   PROCEDURES  Procedures  ____________________________________________   INITIAL IMPRESSION / ASSESSMENT AND PLAN / ED COURSE  Pertinent labs & imaging results that were available during my care of the patient were reviewed by me and considered in my medical decision making (see chart for details).  Patient presented to the emergency department today with continued headache. Head CT was performed which is consistent with sinusitis. Patient was given antibiotics by primary care doctor earlier today  for a diagnosis of the same. I looked at results sent from urgent care yesterday patient had an ESR 40. While this is mildly elevated at is not significantly so. I think temporal arteritis would be less likely. Patient is already taking steroids. I will give patient ophthalmology for follow-up.  ____________________________________________   FINAL CLINICAL IMPRESSION(S) / ED DIAGNOSES  Final diagnoses:  Sinusitis, unspecified chronicity, unspecified location     Note: This dictation was prepared with Dragon dictation. Any transcriptional errors that result from this process are unintentional     Nance Pear, MD 07/01/16 2248

## 2016-07-01 NOTE — ED Triage Notes (Signed)
Pt presents with headache, intermittent x 2 days. She was seen yesterday at urgent care [place], given an injection, and prescriptions for hydrocodone and prednisone. She awakened this morning with the same headache. She took the prednisone this morning and took only a small part of the hydrocodone because she had to go to work. She states it continues to hurt now. Pt alert & oriented with NAD noted.

## 2016-07-06 ENCOUNTER — Other Ambulatory Visit: Payer: Self-pay

## 2016-07-10 ENCOUNTER — Other Ambulatory Visit: Payer: Self-pay | Admitting: Family Medicine

## 2016-07-10 DIAGNOSIS — I10 Essential (primary) hypertension: Secondary | ICD-10-CM

## 2016-07-13 ENCOUNTER — Ambulatory Visit (INDEPENDENT_AMBULATORY_CARE_PROVIDER_SITE_OTHER): Payer: BC Managed Care – PPO | Admitting: Family Medicine

## 2016-07-13 ENCOUNTER — Other Ambulatory Visit: Payer: Self-pay

## 2016-07-13 ENCOUNTER — Encounter: Payer: Self-pay | Admitting: Family Medicine

## 2016-07-13 ENCOUNTER — Other Ambulatory Visit: Payer: Self-pay | Admitting: Family Medicine

## 2016-07-13 VITALS — BP 120/62 | HR 88 | Ht 61.5 in | Wt 126.0 lb

## 2016-07-13 DIAGNOSIS — J209 Acute bronchitis, unspecified: Secondary | ICD-10-CM

## 2016-07-13 DIAGNOSIS — J449 Chronic obstructive pulmonary disease, unspecified: Secondary | ICD-10-CM

## 2016-07-13 DIAGNOSIS — J440 Chronic obstructive pulmonary disease with acute lower respiratory infection: Principal | ICD-10-CM

## 2016-07-13 DIAGNOSIS — R0602 Shortness of breath: Secondary | ICD-10-CM | POA: Diagnosis not present

## 2016-07-13 MED ORDER — AMOXICILLIN-POT CLAVULANATE 875-125 MG PO TABS: 1.0000 | ORAL_TABLET | Freq: Two times a day (BID) | ORAL | 0 refills | Status: DC

## 2016-07-13 MED ORDER — IPRATROPIUM-ALBUTEROL 0.5-2.5 (3) MG/3ML IN SOLN: 3.0000 mL | Freq: Once | RESPIRATORY_TRACT | Status: DC

## 2016-07-13 MED ORDER — DOXYCYCLINE HYCLATE 100 MG PO TABS: 100.0000 mg | ORAL_TABLET | Freq: Two times a day (BID) | ORAL | 0 refills | Status: DC

## 2016-07-13 MED ORDER — ALBUTEROL SULFATE HFA 108 (90 BASE) MCG/ACT IN AERS
2.0000 | INHALATION_SPRAY | Freq: Four times a day (QID) | RESPIRATORY_TRACT | 0 refills | Status: DC | PRN
Start: 2016-07-13 — End: 2017-05-31

## 2016-07-13 MED ORDER — PREDNISONE 20 MG PO TABS: ORAL_TABLET | ORAL | 0 refills | Status: DC

## 2016-07-13 NOTE — Progress Notes (Signed)
Name: Samantha Irwin   MRN: [id number]    DOB: 02-21-1960   Date:07/13/2016       Progress Note  Subjective  Chief Complaint  Chief Complaint  Patient presents with  . Cough    can't cough anything up    Cough  This is a new problem. The current episode started in the past 7 days. The problem has been gradually worsening. The problem occurs every few minutes. The cough is productive of purulent sputum (yellow). Associated symptoms include nasal congestion, postnasal drip and rhinorrhea. Pertinent negatives include no chest pain, chills, ear congestion, ear pain, fever, headaches, heartburn, hemoptysis, myalgias, rash, sore throat, shortness of breath, weight loss or wheezing. The symptoms are aggravated by pollens. She has tried a beta-agonist inhaler for the symptoms. The treatment provided no relief. Her past medical history is significant for COPD. There is no history of environmental allergies.    No problem-specific Assessment & Plan notes found for this encounter.   Past Medical History:  Diagnosis Date  . Hypertension     Past Surgical History:  Procedure Laterality Date  . COLONOSCOPY  2012   cleared for 5 yrs- [place]    Family History  Problem Relation Age of Onset  . Diabetes Mother   . Breast cancer Neg Hx     Social History   Social History  . Marital status: Divorced    Spouse name: N/A  . Number of children: N/A  . Years of education: N/A   Occupational History  . Not on file.   Social History Main Topics  . Smoking status: Current Every Day Smoker    Packs/day: 1.00    Types: Cigarettes  . Smokeless tobacco: Never Used  . Alcohol use 0.0 oz/week  . Drug use: No  . Sexual activity: Yes   Other Topics Concern  . Not on file   Social History Narrative  . No narrative on file    No Known Allergies  Outpatient Medications Prior to Visit  Medication Sig Dispense Refill  . amLODipine (NORVASC) 5 MG tablet TAKE 1 TABLET BY MOUTH ONCE  DAILY 30 tablet 0  . Biotin 10 MG TABS Take 1 tablet by mouth daily.    . cholecalciferol (VITAMIN D) 1000 UNITS tablet Take 1,000 Units by mouth daily.    . Multiple Vitamins-Minerals (MULTIVITAMIN WOMEN 50+ PO) Take 1 tablet by mouth daily.    . Omega-3 Fatty Acids (FISH OIL) 1000 MG CAPS Take 1 capsule by mouth daily.    Marland Kitchen triamcinolone ointment (KENALOG) 0.5 % Apply 1 application topically 2 (two) times daily. 30 g 0  . vitamin C (ASCORBIC ACID) 250 MG tablet Take 250 mg by mouth daily.    Marland Kitchen albuterol (PROVENTIL HFA;VENTOLIN HFA) 108 (90 Base) MCG/ACT inhaler Inhale 2 puffs into the lungs every 6 (six) hours as needed for wheezing or shortness of breath. 1 Inhaler 0  . amoxicillin (AMOXIL) 500 MG capsule Take 1 capsule (500 mg total) by mouth 3 (three) times daily. 30 capsule 0  . predniSONE (DELTASONE) 20 MG tablet 3 tabs po qd 15 tablet 0  . cyclobenzaprine (FLEXERIL) 10 MG tablet Take 1 tablet (10 mg total) by mouth at bedtime. (Patient not taking: Reported on 07/01/2016) 30 tablet 0  . HYDROcodone-acetaminophen (NORCO/VICODIN) 5-325 MG tablet 1-2 tabs po qd prn 6 tablet 0   No facility-administered medications prior to visit.     Review of Systems  Constitutional: Negative for chills, fever, malaise/fatigue and weight  loss.  HENT: Positive for postnasal drip and rhinorrhea. Negative for ear discharge, ear pain and sore throat.   Eyes: Negative for blurred vision.  Respiratory: Positive for cough. Negative for hemoptysis, sputum production, shortness of breath and wheezing.   Cardiovascular: Negative for chest pain, palpitations and leg swelling.  Gastrointestinal: Negative for abdominal pain, blood in stool, constipation, diarrhea, heartburn, melena and nausea.  Genitourinary: Negative for dysuria, frequency, hematuria and urgency.  Musculoskeletal: Negative for back pain, joint pain, myalgias and neck pain.  Skin: Negative for rash.  Neurological: Negative for dizziness, tingling,  sensory change, focal weakness and headaches.  Endo/Heme/Allergies: Negative for environmental allergies and polydipsia. Does not bruise/bleed easily.  Psychiatric/Behavioral: Negative for depression and suicidal ideas. The patient is not nervous/anxious and does not have insomnia.      Objective  Vitals:   07/13/16 1539  BP: 120/62  Pulse: 88  Weight: 126 lb (57.2 kg)  Height: 5' 1.5" (1.562 m)    Physical Exam  Constitutional: She is well-developed, well-nourished, and in no distress. No distress.  HENT:  Head: Normocephalic and atraumatic.  Right Ear: External ear normal.  Left Ear: External ear normal.  Nose: Nose normal.  Mouth/Throat: Oropharynx is clear and moist.  Eyes: Conjunctivae and EOM are normal. Pupils are equal, round, and reactive to light. Right eye exhibits no discharge. Left eye exhibits no discharge.  Neck: Normal range of motion. Neck supple. No JVD present. No thyromegaly present.  Cardiovascular: Normal rate, regular rhythm, normal heart sounds and intact distal pulses.  Exam reveals no gallop and no friction rub.   No murmur heard. Pulmonary/Chest: Effort normal. She has wheezes. She has no rales.  Abdominal: Soft. Bowel sounds are normal. She exhibits no mass. There is no tenderness. There is no guarding.  Musculoskeletal: Normal range of motion. She exhibits no edema.  Lymphadenopathy:    She has no cervical adenopathy.  Neurological: She is alert.  Skin: Skin is warm and dry. She is not diaphoretic.  Psychiatric: Mood and affect normal.  Nursing note and vitals reviewed.     Assessment & Plan  Problem List Items Addressed This Visit    None    Visit Diagnoses    Acute bronchitis with COPD (HCC)    -  Primary   Relevant Medications   ipratropium-albuterol (DUONEB) 0.5-2.5 (3) MG/3ML nebulizer solution 3 mL   predniSONE (DELTASONE) 20 MG tablet   doxycycline (VIBRA-TABS) 100 MG tablet   albuterol (PROVENTIL HFA;VENTOLIN HFA) 108 (90 Base)  MCG/ACT inhaler   Chronic obstructive pulmonary disease, unspecified COPD type (HCC)       Relevant Medications   ipratropium-albuterol (DUONEB) 0.5-2.5 (3) MG/3ML nebulizer solution 3 mL   predniSONE (DELTASONE) 20 MG tablet   albuterol (PROVENTIL HFA;VENTOLIN HFA) 108 (90 Base) MCG/ACT inhaler   Shortness of breath       Relevant Medications   albuterol (PROVENTIL HFA;VENTOLIN HFA) 108 (90 Base) MCG/ACT inhaler      Meds ordered this encounter  Medications  . ipratropium-albuterol (DUONEB) 0.5-2.5 (3) MG/3ML nebulizer solution 3 mL  . predniSONE (DELTASONE) 20 MG tablet    Sig: 3 tabs po qd    Dispense:  15 tablet    Refill:  0  . DISCONTD: amoxicillin-clavulanate (AUGMENTIN) 875-125 MG tablet    Sig: Take 1 tablet by mouth 2 (two) times daily.    Dispense:  20 tablet    Refill:  0  . doxycycline (VIBRA-TABS) 100 MG tablet    Sig: Take  1 tablet (100 mg total) by mouth 2 (two) times daily.    Dispense:  20 tablet    Refill:  0  . albuterol (PROVENTIL HFA;VENTOLIN HFA) 108 (90 Base) MCG/ACT inhaler    Sig: Inhale 2 puffs into the lungs every 6 (six) hours as needed for wheezing or shortness of breath.    Dispense:  1 Inhaler    Refill:  0      Dr. Hayden Rasmusseneanna Roemello [facility]  07/13/16

## 2016-08-14 ENCOUNTER — Other Ambulatory Visit: Payer: Self-pay | Admitting: Family Medicine

## 2016-08-14 DIAGNOSIS — I10 Essential (primary) hypertension: Secondary | ICD-10-CM

## 2016-09-13 ENCOUNTER — Encounter: Payer: Self-pay | Admitting: Family Medicine

## 2016-09-13 ENCOUNTER — Ambulatory Visit (INDEPENDENT_AMBULATORY_CARE_PROVIDER_SITE_OTHER): Payer: BC Managed Care – PPO | Admitting: Family Medicine

## 2016-09-13 VITALS — BP 120/80 | HR 80 | Ht 61.5 in | Wt 125.0 lb

## 2016-09-13 DIAGNOSIS — Z1211 Encounter for screening for malignant neoplasm of colon: Secondary | ICD-10-CM

## 2016-09-13 DIAGNOSIS — N393 Stress incontinence (female) (male): Secondary | ICD-10-CM

## 2016-09-13 DIAGNOSIS — R1032 Left lower quadrant pain: Secondary | ICD-10-CM | POA: Diagnosis not present

## 2016-09-13 DIAGNOSIS — H6123 Impacted cerumen, bilateral: Secondary | ICD-10-CM

## 2016-09-13 DIAGNOSIS — Z23 Encounter for immunization: Secondary | ICD-10-CM

## 2016-09-13 DIAGNOSIS — K5792 Diverticulitis of intestine, part unspecified, without perforation or abscess without bleeding: Secondary | ICD-10-CM

## 2016-09-13 DIAGNOSIS — R350 Frequency of micturition: Secondary | ICD-10-CM | POA: Diagnosis not present

## 2016-09-13 DIAGNOSIS — J069 Acute upper respiratory infection, unspecified: Secondary | ICD-10-CM | POA: Diagnosis not present

## 2016-09-13 LAB — POCT URINALYSIS DIPSTICK
BILIRUBIN UA: NEGATIVE
GLUCOSE UA: NEGATIVE
Ketones, UA: NEGATIVE
LEUKOCYTES UA: NEGATIVE
NITRITE UA: NEGATIVE
PH UA: 6 (ref 5.0–8.0)
Protein, UA: NEGATIVE
RBC UA: NEGATIVE
Spec Grav, UA: 1.02 (ref 1.010–1.025)
UROBILINOGEN UA: 0.2 U/dL

## 2016-09-13 MED ORDER — AMOXICILLIN 500 MG PO CAPS: 500.0000 mg | ORAL_CAPSULE | Freq: Three times a day (TID) | ORAL | 0 refills | Status: DC

## 2016-09-13 MED ORDER — METRONIDAZOLE 500 MG PO TABS: 500.0000 mg | ORAL_TABLET | Freq: Three times a day (TID) | ORAL | 0 refills | Status: DC

## 2016-09-13 NOTE — Progress Notes (Signed)
Name: Samantha Irwin   MRN: [id number]    DOB: 05-08-1960   Date:09/13/2016       Progress Note  Subjective  Chief Complaint  Chief Complaint  Patient presents with  . Abdominal Pain    started after eating japanese food on Friday- woke her up in middle of night hurting/ cramps. No diarrhea or n/v  . Sinusitis    cough and cong  . ears impacted  . Urinary Frequency    Abdominal Pain  This is a new problem. The current episode started in the past 7 days. The onset quality is gradual. The problem occurs daily. The problem has been waxing and waning. The pain is located in the suprapubic region, RLQ and LLQ. The pain is at a severity of 6/10. The pain is moderate. The quality of the pain is cramping. The abdominal pain does not radiate. Associated symptoms include dysuria and frequency. Pertinent negatives include no anorexia, arthralgias, belching, constipation, diarrhea, fever, flatus, headaches, hematochezia, hematuria, melena, myalgias, nausea, vomiting or weight loss. Associated symptoms comments: diaphoresis. The pain is relieved by being still. She has tried antacids (pepto) for the symptoms. The treatment provided no relief. There is no history of abdominal surgery, colon cancer, Crohn's disease, gallstones, GERD, irritable bowel syndrome, pancreatitis, PUD or ulcerative colitis.  Sinusitis  This is a new problem. The current episode started in the past 7 days. The problem has been waxing and waning since onset. There has been no fever. The pain is moderate. Pertinent negatives include no chills, congestion, headaches, shortness of breath, sinus pressure or swollen glands.    No problem-specific Assessment & Plan notes found for this encounter.   Past Medical History:  Diagnosis Date  . Hypertension     Past Surgical History:  Procedure Laterality Date  . COLONOSCOPY  2012   cleared for 5 yrs- [place]    Family History  Problem Relation Age of Onset  . Diabetes Mother    . Breast cancer Neg Hx     Social History   Social History  . Marital status: Divorced    Spouse name: N/A  . Number of children: N/A  . Years of education: N/A   Occupational History  . Not on file.   Social History Main Topics  . Smoking status: Current Every Day Smoker    Packs/day: 1.00    Types: Cigarettes  . Smokeless tobacco: Never Used  . Alcohol use 0.0 oz/week  . Drug use: No  . Sexual activity: Yes   Other Topics Concern  . Not on file   Social History Narrative  . No narrative on file    No Known Allergies  Outpatient Medications Prior to Visit  Medication Sig Dispense Refill  . albuterol (PROVENTIL HFA;VENTOLIN HFA) 108 (90 Base) MCG/ACT inhaler Inhale 2 puffs into the lungs every 6 (six) hours as needed for wheezing or shortness of breath. 1 Inhaler 0  . amLODipine (NORVASC) 5 MG tablet TAKE 1 TABLET BY MOUTH ONCE DAILY 30 tablet 0  . Biotin 10 MG TABS Take 1 tablet by mouth daily.    . cholecalciferol (VITAMIN D) 1000 UNITS tablet Take 1,000 Units by mouth daily.    . cyclobenzaprine (FLEXERIL) 10 MG tablet Take 1 tablet (10 mg total) by mouth at bedtime. 30 tablet 0  . Multiple Vitamins-Minerals (MULTIVITAMIN WOMEN 50+ PO) Take 1 tablet by mouth daily.    . Omega-3 Fatty Acids (FISH OIL) 1000 MG CAPS Take 1 capsule by mouth  daily.    Marland Kitchen PROVENTIL HFA 108 (90 Base) MCG/ACT inhaler INHALE 2 PUFFS BY MOUTH EVERY 6 HOURS AS NEEDED FOR WHEEZING FOR SHORTNESS OF BREATH 9 each 0  . triamcinolone ointment (KENALOG) 0.5 % Apply 1 application topically 2 (two) times daily. 30 g 0  . vitamin C (ASCORBIC ACID) 250 MG tablet Take 250 mg by mouth daily.    Marland Kitchen doxycycline (VIBRA-TABS) 100 MG tablet Take 1 tablet (100 mg total) by mouth 2 (two) times daily. 20 tablet 0  . predniSONE (DELTASONE) 20 MG tablet 3 tabs po qd 15 tablet 0   Facility-Administered Medications Prior to Visit  Medication Dose Route Frequency Provider Last Rate Last Dose  . ipratropium-albuterol  (DUONEB) 0.5-2.5 (3) MG/3ML nebulizer solution 3 mL  3 mL Nebulization Once Duanne Limerick, MD        Review of Systems  Constitutional: Negative for chills, fever and weight loss.  HENT: Negative for congestion and sinus pressure.   Respiratory: Negative for shortness of breath.   Gastrointestinal: Positive for abdominal pain. Negative for anorexia, constipation, diarrhea, flatus, hematochezia, melena, nausea and vomiting.  Genitourinary: Positive for dysuria and frequency. Negative for hematuria.  Musculoskeletal: Negative for arthralgias and myalgias.  Neurological: Negative for headaches.     Objective  Vitals:   09/13/16 1510  BP: 120/80  Pulse: 80  Weight: 125 lb (56.7 kg)  Height: 5' 1.5" (1.562 m)    Physical Exam  Constitutional: She is well-developed, well-nourished, and in no distress. No distress.  HENT:  Head: Normocephalic and atraumatic.  Right Ear: External ear normal.  Left Ear: External ear normal.  Nose: Right sinus exhibits maxillary sinus tenderness. Left sinus exhibits maxillary sinus tenderness.  Mouth/Throat: Oropharynx is clear and moist. No oropharyngeal exudate.  bilat cerumen impaction  Eyes: Pupils are equal, round, and reactive to light. Conjunctivae and EOM are normal. Right eye exhibits no discharge. Left eye exhibits no discharge.  Neck: Normal range of motion. Neck supple. No JVD present. No thyromegaly present.  Cardiovascular: Normal rate, regular rhythm, normal heart sounds and intact distal pulses.  Exam reveals no gallop and no friction rub.   No murmur heard. Pulmonary/Chest: Effort normal and breath sounds normal. She has no wheezes. She has no rales.  Abdominal: Soft. Normal aorta and bowel sounds are normal. She exhibits no mass. There is no hepatosplenomegaly. There is tenderness in the right lower quadrant, suprapubic area and left lower quadrant. There is rebound. There is no rigidity, no guarding and no CVA tenderness.   Musculoskeletal: Normal range of motion. She exhibits no edema.  Lymphadenopathy:    She has no cervical adenopathy.  Neurological: She is alert. She has normal reflexes.  Skin: Skin is warm and dry. She is not diaphoretic.  Psychiatric: Mood and affect normal.      Assessment & Plan  Problem List Items Addressed This Visit    None    Visit Diagnoses    Diverticulitis    -  Primary   Relevant Medications   amoxicillin (AMOXIL) 500 MG capsule   metroNIDAZOLE (FLAGYL) 500 MG tablet   Other Relevant Orders   CBC with Differential/Platelet   Bilateral impacted cerumen       Upper respiratory tract infection, unspecified type       Relevant Medications   metroNIDAZOLE (FLAGYL) 500 MG tablet   Need for diphtheria-tetanus-pertussis (Tdap) vaccine       Colon cancer screening       Relevant Orders  Ambulatory referral to Gastroenterology   Left lower quadrant pain       Relevant Orders   POCT urinalysis dipstick (Completed)   Urinary frequency       Relevant Orders   Glucose   Hemoglobin A1c   Urinary, incontinence, stress female          Meds ordered this encounter  Medications  . amoxicillin (AMOXIL) 500 MG capsule    Sig: Take 1 capsule (500 mg total) by mouth 3 (three) times daily.    Dispense:  30 capsule    Refill:  0  . metroNIDAZOLE (FLAGYL) 500 MG tablet    Sig: Take 1 tablet (500 mg total) by mouth 3 (three) times daily.    Dispense:  30 tablet    Refill:  0      Dr. Hayden [facility]  09/13/16

## 2016-09-14 LAB — CBC WITH DIFFERENTIAL/PLATELET
Basophils Absolute: 0 10*3/uL (ref 0.0–0.2)
Basos: 0 %
EOS (ABSOLUTE): 0.2 10*3/uL (ref 0.0–0.4)
Eos: 4 %
Hematocrit: 37.4 % (ref 34.0–46.6)
Hemoglobin: 12.4 g/dL (ref 11.1–15.9)
IMMATURE GRANULOCYTES: 0 %
Immature Grans (Abs): 0 10*3/uL (ref 0.0–0.1)
LYMPHS: 22 %
Lymphocytes Absolute: 1.5 10*3/uL (ref 0.7–3.1)
MCH: 29.5 pg (ref 26.6–33.0)
MCHC: 33.2 g/dL (ref 31.5–35.7)
MCV: 89 fL (ref 79–97)
MONOCYTES: 11 %
MONOS ABS: 0.8 10*3/uL (ref 0.1–0.9)
Neutrophils Absolute: 4.1 10*3/uL (ref 1.4–7.0)
Neutrophils: 63 %
PLATELETS: 297 10*3/uL (ref 150–379)
RBC: 4.2 x10E6/uL (ref 3.77–5.28)
RDW: 12.2 % — AB (ref 12.3–15.4)
WBC: 6.6 10*3/uL (ref 3.4–10.8)

## 2016-09-14 LAB — HEMOGLOBIN A1C
ESTIMATED AVERAGE GLUCOSE: 103 mg/dL
Hgb A1c MFr Bld: 5.2 % (ref 4.8–5.6)

## 2016-09-14 LAB — GLUCOSE, RANDOM: Glucose: 90 mg/dL (ref 65–99)

## 2016-09-27 ENCOUNTER — Other Ambulatory Visit: Payer: Self-pay | Admitting: Family Medicine

## 2016-09-27 DIAGNOSIS — I10 Essential (primary) hypertension: Secondary | ICD-10-CM

## 2016-10-06 ENCOUNTER — Telehealth: Payer: Self-pay | Admitting: Gastroenterology

## 2016-10-06 NOTE — Telephone Encounter (Signed)
Patient called to schedule her colonoscopy °

## 2016-10-07 ENCOUNTER — Other Ambulatory Visit: Payer: Self-pay

## 2016-10-07 DIAGNOSIS — Z1211 Encounter for screening for malignant neoplasm of colon: Secondary | ICD-10-CM

## 2016-10-07 NOTE — Telephone Encounter (Signed)
Gastroenterology Pre-Procedure Review  Request Date: 9/25 Requesting Physician: Dr. Servando SnareWohl  PATIENT REVIEW QUESTIONS: The patient responded to the following health history questions as indicated:    1. Are you having any GI issues? no 2. Do you have a personal history of Polyps? no 3. Do you have a family history of Colon Cancer or Polyps? no 4. Diabetes Mellitus? no 5. Joint replacements in the past 12 months?no 6. Major health problems in the past 3 months?no 7. Any artificial heart valves, MVP, or defibrillator?no    MEDICATIONS & ALLERGIES:    Patient reports the following regarding taking any anticoagulation/antiplatelet therapy:   Plavix, Coumadin, Eliquis, Xarelto, Lovenox, Pradaxa, Brilinta, or Effient? no Aspirin? no  Patient confirms/reports the following medications:  Current Outpatient Prescriptions  Medication Sig Dispense Refill  . albuterol (PROVENTIL HFA;VENTOLIN HFA) 108 (90 Base) MCG/ACT inhaler Inhale 2 puffs into the lungs every 6 (six) hours as needed for wheezing or shortness of breath. 1 Inhaler 0  . amLODipine (NORVASC) 5 MG tablet TAKE 1 TABLET BY MOUTH ONCE DAILY 30 tablet 1  . amoxicillin (AMOXIL) 500 MG capsule Take 1 capsule (500 mg total) by mouth 3 (three) times daily. 30 capsule 0  . Biotin 10 MG TABS Take 1 tablet by mouth daily.    . cholecalciferol (VITAMIN D) 1000 UNITS tablet Take 1,000 Units by mouth daily.    . cyclobenzaprine (FLEXERIL) 10 MG tablet Take 1 tablet (10 mg total) by mouth at bedtime. 30 tablet 0  . metroNIDAZOLE (FLAGYL) 500 MG tablet Take 1 tablet (500 mg total) by mouth 3 (three) times daily. 30 tablet 0  . Multiple Vitamins-Minerals (MULTIVITAMIN WOMEN 50+ PO) Take 1 tablet by mouth daily.    . Omega-3 Fatty Acids (FISH OIL) 1000 MG CAPS Take 1 capsule by mouth daily.    Marland Kitchen. PROVENTIL HFA 108 (90 Base) MCG/ACT inhaler INHALE 2 PUFFS BY MOUTH EVERY 6 HOURS AS NEEDED FOR WHEEZING FOR SHORTNESS OF BREATH 9 each 0  . triamcinolone  ointment (KENALOG) 0.5 % Apply 1 application topically 2 (two) times daily. 30 g 0  . vitamin C (ASCORBIC ACID) 250 MG tablet Take 250 mg by mouth daily.     Current Facility-Administered Medications  Medication Dose Route Frequency Provider Last Rate Last Dose  . ipratropium-albuterol (DUONEB) 0.5-2.5 (3) MG/3ML nebulizer solution 3 mL  3 mL Nebulization Once Duanne LimerickJones, Deanna C, MD        Patient confirms/reports the following allergies:  No Known Allergies  No orders of the defined types were placed in this encounter.   AUTHORIZATION INFORMATION Primary Insurance: 1D#: Group #:  Secondary Insurance: 1D#: Group #:  SCHEDULE INFORMATION: Date: 9/25 Time: Location: [facility]

## 2016-10-26 ENCOUNTER — Encounter: Payer: Self-pay | Admitting: *Deleted

## 2016-11-01 NOTE — Discharge Instructions (Signed)
General Anesthesia, Adult, Care After °These instructions provide you with information about caring for yourself after your procedure. Your health care provider may also give you more specific instructions. Your treatment has been planned according to current medical practices, but problems sometimes occur. Call your health care provider if you have any problems or questions after your procedure. °What can I expect after the procedure? °After the procedure, it is common to have: °· Vomiting. °· A sore throat. °· Mental slowness. ° °It is common to feel: °· Nauseous. °· Cold or shivery. °· Sleepy. °· Tired. °· Sore or achy, even in parts of your body where you did not have surgery. ° °Follow these instructions at home: °For at least 24 hours after the procedure: °· Do not: °? Participate in activities where you could fall or become injured. °? Drive. °? Use heavy machinery. °? Drink alcohol. °? Take sleeping pills or medicines that cause drowsiness. °? Make important decisions or sign legal documents. °? Take care of children on your own. °· Rest. °Eating and drinking °· If you vomit, drink water, juice, or soup when you can drink without vomiting. °· Drink enough fluid to keep your urine clear or pale yellow. °· Make sure you have little or no nausea before eating solid foods. °· Follow the diet recommended by your health care provider. °General instructions °· Have a responsible adult stay with you until you are awake and alert. °· Return to your normal activities as told by your health care provider. Ask your health care provider what activities are safe for you. °· Take over-the-counter and prescription medicines only as told by your health care provider. °· If you smoke, do not smoke without supervision. °· Keep all follow-up visits as told by your health care provider. This is important. °Contact a health care provider if: °· You continue to have nausea or vomiting at home, and medicines are not helpful. °· You  cannot drink fluids or start eating again. °· You cannot urinate after 8-12 hours. °· You develop a skin rash. °· You have fever. °· You have increasing redness at the site of your procedure. °Get help right away if: °· You have difficulty breathing. °· You have chest pain. °· You have unexpected bleeding. °· You feel that you are having a life-threatening or urgent problem. °This information is not intended to replace advice given to you by your health care provider. Make sure you discuss any questions you have with your health care provider. °Document Released: 05/03/2000 Document Revised: 06/30/2015 Document Reviewed: 01/09/2015 °Elsevier Interactive Patient Education © 2018 [employer]vier Inc. ° °

## 2016-11-02 ENCOUNTER — Ambulatory Visit
Admission: RE | Admit: 2016-11-02 | Discharge: 2016-11-02 | Disposition: A | Discharge status: Home / Self Care | Payer: BC Managed Care – PPO | Source: Ambulatory Visit | Attending: Gastroenterology | Admitting: Gastroenterology

## 2016-11-02 ENCOUNTER — Ambulatory Visit: Payer: BC Managed Care – PPO | Admitting: Anesthesiology

## 2016-11-02 ENCOUNTER — Other Ambulatory Visit: Payer: Self-pay | Admitting: Family Medicine

## 2016-11-02 ENCOUNTER — Encounter
Admission: RE | Disposition: A | Discharge status: Home / Self Care | Payer: Self-pay | Source: Ambulatory Visit | Attending: Gastroenterology

## 2016-11-02 DIAGNOSIS — I10 Essential (primary) hypertension: Secondary | ICD-10-CM | POA: Insufficient documentation

## 2016-11-02 DIAGNOSIS — F17210 Nicotine dependence, cigarettes, uncomplicated: Secondary | ICD-10-CM | POA: Diagnosis not present

## 2016-11-02 DIAGNOSIS — K621 Rectal polyp: Secondary | ICD-10-CM | POA: Insufficient documentation

## 2016-11-02 DIAGNOSIS — K5730 Diverticulosis of large intestine without perforation or abscess without bleeding: Secondary | ICD-10-CM | POA: Insufficient documentation

## 2016-11-02 DIAGNOSIS — Z1211 Encounter for screening for malignant neoplasm of colon: Secondary | ICD-10-CM | POA: Diagnosis not present

## 2016-11-02 DIAGNOSIS — Z79899 Other long term (current) drug therapy: Secondary | ICD-10-CM | POA: Diagnosis not present

## 2016-11-02 HISTORY — PX: COLONOSCOPY WITH PROPOFOL: SHX5780

## 2016-11-02 HISTORY — DX: Presence of dental prosthetic device (complete) (partial): Z97.2

## 2016-11-02 SURGERY — COLONOSCOPY WITH PROPOFOL
Anesthesia: General | Wound class: Dirty or Infected

## 2016-11-02 MED ORDER — SODIUM CHLORIDE 0.9 % IV SOLN: INTRAVENOUS | Status: DC

## 2016-11-02 MED ORDER — LIDOCAINE HCL (CARDIAC) 20 MG/ML IV SOLN
INTRAVENOUS | Status: DC | PRN
  Administered 2016-11-02: 40 mg via INTRAVENOUS

## 2016-11-02 MED ORDER — ACETAMINOPHEN 325 MG PO TABS: 325.0000 mg | ORAL_TABLET | ORAL | Status: DC | PRN

## 2016-11-02 MED ORDER — ACETAMINOPHEN 160 MG/5ML PO SOLN: 325.0000 mg | ORAL | Status: DC | PRN

## 2016-11-02 MED ORDER — PROPOFOL 10 MG/ML IV BOLUS
INTRAVENOUS | Status: DC | PRN
  Administered 2016-11-02 (×2): 20 mg via INTRAVENOUS
  Administered 2016-11-02: 30 mg via INTRAVENOUS
  Administered 2016-11-02 (×3): 20 mg via INTRAVENOUS
  Administered 2016-11-02: 10 mg via INTRAVENOUS
  Administered 2016-11-02 (×2): 20 mg via INTRAVENOUS
  Administered 2016-11-02: 70 mg via INTRAVENOUS
  Administered 2016-11-02: 20 mg via INTRAVENOUS
  Administered 2016-11-02: 40 mg via INTRAVENOUS
  Administered 2016-11-02: 30 mg via INTRAVENOUS
  Administered 2016-11-02: 20 mg via INTRAVENOUS

## 2016-11-02 MED ORDER — LACTATED RINGERS IV SOLN
10.0000 mL/h | INTRAVENOUS | Status: DC
  Administered 2016-11-02: 10 mL/h via INTRAVENOUS

## 2016-11-02 MED ORDER — ONDANSETRON HCL 4 MG/2ML IJ SOLN: 4.0000 mg | Freq: Once | INTRAMUSCULAR | Status: DC | PRN

## 2016-11-02 SURGICAL SUPPLY — 23 items
CANISTER SUCT 1200ML W/VALVE (MISCELLANEOUS) ×2 IMPLANT
CLIP HMST 235XBRD CATH ROT (MISCELLANEOUS) IMPLANT
CLIP RESOLUTION 360 11X235 (MISCELLANEOUS)
FCP ESCP3.2XJMB 240X2.8X (MISCELLANEOUS)
FORCEPS BIOP RAD 4 LRG CAP 4 (CUTTING FORCEPS) ×2 IMPLANT
FORCEPS BIOP RJ4 240 W/NDL (MISCELLANEOUS)
FORCEPS ESCP3.2XJMB 240X2.8X (MISCELLANEOUS) IMPLANT
GOWN CVR UNV OPN BCK APRN NK (MISCELLANEOUS) ×2 IMPLANT
GOWN ISOL THUMB LOOP REG UNIV (MISCELLANEOUS) ×2
INJECTOR VARIJECT VIN23 (MISCELLANEOUS) IMPLANT
KIT DEFENDO VALVE AND CONN (KITS) IMPLANT
KIT ENDO PROCEDURE OLY (KITS) ×2 IMPLANT
MARKER SPOT ENDO TATTOO 5ML (MISCELLANEOUS) IMPLANT
PAD GROUND ADULT SPLIT (MISCELLANEOUS) IMPLANT
PROBE APC STR FIRE (PROBE) IMPLANT
RETRIEVER NET ROTH 2.5X230 LF (MISCELLANEOUS) IMPLANT
SNARE SHORT THROW 13M SML OVAL (MISCELLANEOUS) ×2 IMPLANT
SNARE SHORT THROW 30M LRG OVAL (MISCELLANEOUS) IMPLANT
SNARE SNG USE RND 15MM (INSTRUMENTS) IMPLANT
SPOT EX ENDOSCOPIC TATTOO (MISCELLANEOUS)
TRAP ETRAP POLY (MISCELLANEOUS) ×2 IMPLANT
VARIJECT INJECTOR VIN23 (MISCELLANEOUS)
WATER STERILE IRR 250ML POUR (IV SOLUTION) ×2 IMPLANT

## 2016-11-02 NOTE — H&P (Signed)
Arlyss Repress, MD [address]  [address]  [place], [place] [address]  Main: [phone]  Fax: [phone] Pager: [phone]  Primary Care Physician:  Duanne Limerick, MD Primary Gastroenterologist:  Dr. Arlyss Repress  Pre-Procedure History & Physical: HPI:  Samantha Irwin is a 56 y.o. female is here for an colonoscopy.   Past Medical History:  Diagnosis Date  . Hypertension   . Wears dentures    full upper, partial lower    Past Surgical History:  Procedure Laterality Date  . CESAREAN SECTION    . COLONOSCOPY  2012   cleared for 5 yrs- [place]    Prior to Admission medications   Medication Sig Start Date End Date Taking? Authorizing Provider  albuterol (PROVENTIL HFA;VENTOLIN HFA) 108 (90 Base) MCG/ACT inhaler Inhale 2 puffs into the lungs every 6 (six) hours as needed for wheezing or shortness of breath. 07/13/16  Yes Duanne Limerick, MD  amLODipine (NORVASC) 5 MG tablet TAKE 1 TABLET BY MOUTH ONCE DAILY 09/28/16  Yes Duanne Limerick, MD  B Complex Vitamins (B COMPLEX PO) Take by mouth daily.   Yes [provider]  Biotin 10 MG TABS Take 1 tablet by mouth daily.   Yes [provider]  cholecalciferol (VITAMIN D) 1000 UNITS tablet Take 1,000 Units by mouth daily.   Yes [provider]  Multiple Vitamins-Minerals (MULTIVITAMIN WOMEN 50+ PO) Take 1 tablet by mouth daily.   Yes [provider]  Omega-3 Fatty Acids (FISH OIL) 1000 MG CAPS Take 1 capsule by mouth daily.   Yes [provider]  triamcinolone ointment (KENALOG) 0.5 % Apply 1 application topically 2 (two) times daily. 06/25/16  Yes Duanne Limerick, MD  vitamin C (ASCORBIC ACID) 250 MG tablet Take 250 mg by mouth daily.   Yes [provider]    Allergies as of 10/07/2016  . (No Known Allergies)    Family History  Problem Relation Age of Onset  . Diabetes Mother   . Breast cancer Neg Hx     Social History   Social History  . Marital status:  Divorced    Spouse name: N/A  . Number of children: N/A  . Years of education: N/A   Occupational History  . Not on file.   Social History Main Topics  . Smoking status: Current Some Day Smoker    Packs/day: 0.00    Types: Cigarettes  . Smokeless tobacco: Never Used     Comment: now smokes 2-3 cigs/week  . Alcohol use 1.8 oz/week    3 Glasses of wine per week  . Drug use: No  . Sexual activity: Yes   Other Topics Concern  . Not on file   Social History Narrative  . No narrative on file    Review of Systems: See HPI, otherwise negative ROS  Physical Exam: Ht 5' 1.5" (1.562 m)   Wt 127 lb (57.6 kg)   BMI 23.61 kg/m  General:   Alert,  pleasant and cooperative in NAD Head:  Normocephalic and atraumatic. Neck:  Supple; no masses or thyromegaly. Lungs:  Clear throughout to auscultation.    Heart:  Regular rate and rhythm. Abdomen:  Soft, nontender and nondistended. Normal bowel sounds, without guarding, and without rebound.   Neurologic:  Alert and  oriented x4;  grossly normal neurologically.  Impression/Plan: Samantha Irwin is here for an colonoscopy to be performed for colon cancer screening  Risks, benefits, limitations, and alternatives regarding  colonoscopy have been reviewed with the patient.  Questions have been answered.  All parties agreeable.   Lannette Donath, MD  11/02/2016, 8:49 AM

## 2016-11-02 NOTE — Op Note (Signed)
[facility] Patient Name: Samantha Irwin Procedure Date: 11/02/2016 9:48 AM MRN: [id number] Account #: [id number] Date of Birth: [birth year] Admit Type: Outpatient Age: 56 Room: [facility] OR ROOM 01 Gender: Female Note Status: Finalized Procedure:            Colonoscopy Indications:          Screening for colorectal malignant neoplasm, Last                        colonoscopy 5 years ago Providers:            Lin Landsman MD, MD Referring MD:         Juline Patch, MD (Referring MD) Medicines:            Monitored Anesthesia Care Complications:        No immediate complications. Estimated blood loss: None. Procedure:            Pre-Anesthesia Assessment:                       - Prior to the procedure, a History and Physical was                        performed, and patient medications and allergies were                        reviewed. The patient is competent. The risks and                        benefits of the procedure and the sedation options and                        risks were discussed with the patient. All questions                        were answered and informed consent was obtained.                        Patient identification and proposed procedure were                        verified by the physician, the nurse, the                        anesthesiologist, the anesthetist and the technician in                        the pre-procedure area in the procedure room. Mental                        Status Examination: alert and oriented. Airway                        Examination: normal oropharyngeal airway and neck                        mobility. Respiratory Examination: clear to                        auscultation. CV Examination: normal. Prophylactic  Antibiotics: The patient does not require prophylactic                        antibiotics. Prior Anticoagulants: The patient has                        taken no  previous anticoagulant or antiplatelet agents.                        ASA Grade Assessment: II - A patient with mild systemic                        disease. After reviewing the risks and benefits, the                        patient was deemed in satisfactory condition to undergo                        the procedure. The anesthesia plan was to use monitored                        anesthesia care (MAC). Immediately prior to                        administration of medications, the patient was                        re-assessed for adequacy to receive sedatives. The                        heart rate, respiratory rate, oxygen saturations, blood                        pressure, adequacy of pulmonary ventilation, and                        response to care were monitored throughout the                        procedure. The physical status of the patient was                        re-assessed after the procedure.                       After obtaining informed consent, the colonoscope was                        passed under direct vision. Throughout the procedure,                        the patient's blood pressure, pulse, and oxygen                        saturations were monitored continuously. The [place] [id number]) was introduced through the  anus and advanced to the the cecum, identified by                        appendiceal orifice and ileocecal valve. The                        colonoscopy was performed without difficulty. The                        patient tolerated the procedure well. The quality of                        the bowel preparation was evaluated using the BBPS                        [facility] Bowel Preparation Scale) with scores of: Right                        Colon = 3, Transverse Colon = 3 and Left Colon = 3                        (entire mucosa seen well with no residual staining,                        small  fragments of stool or opaque liquid). The total                        BBPS score equals 9. Findings:      Three sessile polyps were found in the rectum. The polyps were 3 to 5 mm       in size. These polyps were removed with a cold snare. Resection and       retrieval were complete.      Multiple small and large-mouthed diverticula were found in the sigmoid       colon.      The exam was otherwise without abnormality on direct and retroflexion       views. Impression:           - Three 3 to 5 mm polyps in the rectum, removed with a                        cold snare. Resected and retrieved.                       - Diverticulosis in the sigmoid colon.                       - The examination was otherwise normal on direct and                        retroflexion views. Recommendation:       - Discharge patient to home.                       - Resume regular diet today.                       - Continue present medications.                       -  Await pathology results.                       - Repeat colonoscopy in 5-10 years for surveillance                        based on pathology results. Procedure Code(s):    --- Professional ---                       [phone], Colonoscopy, flexible; with removal of tumor(s),                        polyp(s), or other lesion(s) by snare technique Diagnosis Code(s):    --- Professional ---                       Z12.11, Encounter for screening for malignant neoplasm                        of colon                       K62.1, Rectal polyp                       K57.30, Diverticulosis of large intestine without                        perforation or abscess without bleeding CPT copyright 2016 American Medical Association. All rights reserved. The codes documented in this report are preliminary and upon coder review may  be revised to meet current compliance requirements. Dr. Ulyess Mort Lin Landsman MD, MD 11/02/2016 10:23:38 AM This report has  been signed electronically. Number of Addenda: 0 Note Initiated On: 11/02/2016 9:48 AM Scope Withdrawal Time: 0 hours 18 minutes 47 seconds  Total Procedure Duration: 0 hours 22 minutes 33 seconds       [facility]

## 2016-11-02 NOTE — Anesthesia Procedure Notes (Signed)
Performed by: Akyia Borelli Pre-anesthesia Checklist: Patient identified, Emergency Drugs available, Suction available, Timeout performed and Patient being monitored Patient Re-evaluated:Patient Re-evaluated prior to induction Oxygen Delivery Method: Nasal cannula Placement Confirmation: positive ETCO2       

## 2016-11-02 NOTE — Transfer of Care (Signed)
Immediate Anesthesia Transfer of Care Note  Patient: Samantha Irwin  Procedure(s) Performed: Procedure(s): COLONOSCOPY WITH PROPOFOL (N/A)  Patient Location: PACU  Anesthesia Type: General  Level of Consciousness: awake, alert  and patient cooperative  Airway and Oxygen Therapy: Patient Spontanous Breathing and Patient connected to supplemental oxygen  Post-op Assessment: Post-op Vital signs reviewed, Patient's Cardiovascular Status Stable, Respiratory Function Stable, Patent Airway and No signs of Nausea or vomiting  Post-op Vital Signs: Reviewed and stable  Complications: No apparent anesthesia complications

## 2016-11-02 NOTE — Anesthesia Preprocedure Evaluation (Addendum)
Anesthesia Evaluation  Patient identified by MRN, date of birth, ID band Patient awake    Reviewed: Allergy & Precautions, NPO status   Airway Mallampati: II  TM Distance: >3 FB     Dental  (+) Upper Dentures, Lower Dentures   Pulmonary neg recent URI, Current Smoker (rare, not daily),    breath sounds clear to auscultation       Cardiovascular hypertension, (-) angina Rhythm:Regular Rate:Normal     Neuro/Psych    GI/Hepatic negative GI ROS,   Endo/Other  negative endocrine ROS  Renal/GU      Musculoskeletal   Abdominal   Peds  Hematology   Anesthesia Other Findings   Reproductive/Obstetrics                            Anesthesia Physical Anesthesia Plan  ASA: II  Anesthesia Plan: General   Post-op Pain Management:    Induction:   PONV Risk Score and Plan:   Airway Management Planned: Nasal Cannula and Natural Airway  Additional Equipment:   Intra-op Plan:   Post-operative Plan:   Informed Consent: I have reviewed the patients History and Physical, chart, labs and discussed the procedure including the risks, benefits and alternatives for the proposed anesthesia with the patient or authorized representative who has indicated his/her understanding and acceptance.     Plan Discussed with: CRNA  Anesthesia Plan Comments:        Anesthesia Quick Evaluation

## 2016-11-02 NOTE — Anesthesia Postprocedure Evaluation (Signed)
Anesthesia Post Note  Patient: Samantha Irwin  Procedure(s) Performed: Procedure(s) (LRB): COLONOSCOPY WITH PROPOFOL (N/A)  Patient location during evaluation: PACU Anesthesia Type: General Level of consciousness: awake Pain management: pain level controlled Vital Signs Assessment: post-procedure vital signs reviewed and stable Respiratory status: respiratory function stable Cardiovascular status: stable Postop Assessment: no signs of nausea or vomiting Anesthetic complications: no    Jola Babinski

## 2016-11-09 ENCOUNTER — Encounter: Payer: Self-pay | Admitting: Gastroenterology

## 2016-12-15 ENCOUNTER — Other Ambulatory Visit: Payer: Self-pay | Admitting: Family Medicine

## 2016-12-15 DIAGNOSIS — Z1231 Encounter for screening mammogram for malignant neoplasm of breast: Secondary | ICD-10-CM

## 2016-12-17 ENCOUNTER — Other Ambulatory Visit: Payer: Self-pay | Admitting: Family Medicine

## 2016-12-17 DIAGNOSIS — I10 Essential (primary) hypertension: Secondary | ICD-10-CM

## 2017-01-10 ENCOUNTER — Ambulatory Visit
Admission: RE | Admit: 2017-01-10 | Discharge: 2017-01-10 | Disposition: A | Discharge status: Home / Self Care | Payer: BC Managed Care – PPO | Source: Ambulatory Visit | Attending: Family Medicine | Admitting: Family Medicine

## 2017-01-10 DIAGNOSIS — Z1231 Encounter for screening mammogram for malignant neoplasm of breast: Secondary | ICD-10-CM | POA: Insufficient documentation

## 2017-01-27 ENCOUNTER — Other Ambulatory Visit: Payer: Self-pay | Admitting: Family Medicine

## 2017-01-27 DIAGNOSIS — I10 Essential (primary) hypertension: Secondary | ICD-10-CM

## 2017-03-07 ENCOUNTER — Other Ambulatory Visit: Payer: Self-pay | Admitting: Family Medicine

## 2017-03-07 DIAGNOSIS — I10 Essential (primary) hypertension: Secondary | ICD-10-CM

## 2017-05-04 ENCOUNTER — Other Ambulatory Visit: Payer: Self-pay

## 2017-05-04 DIAGNOSIS — I10 Essential (primary) hypertension: Secondary | ICD-10-CM

## 2017-05-04 MED ORDER — AMLODIPINE BESYLATE 5 MG PO TABS: 5.0000 mg | ORAL_TABLET | Freq: Every day | ORAL | 0 refills | Status: DC

## 2017-05-12 ENCOUNTER — Encounter: Payer: Self-pay | Admitting: Family Medicine

## 2017-05-12 ENCOUNTER — Ambulatory Visit (INDEPENDENT_AMBULATORY_CARE_PROVIDER_SITE_OTHER): Payer: BC Managed Care – PPO | Admitting: Family Medicine

## 2017-05-12 VITALS — BP 130/96 | HR 60 | Ht 61.0 in | Wt 129.0 lb

## 2017-05-12 DIAGNOSIS — Z1231 Encounter for screening mammogram for malignant neoplasm of breast: Secondary | ICD-10-CM | POA: Diagnosis not present

## 2017-05-12 DIAGNOSIS — Z114 Encounter for screening for human immunodeficiency virus [HIV]: Secondary | ICD-10-CM

## 2017-05-12 DIAGNOSIS — Z0000 Encounter for general adult medical examination without abnormal findings: Secondary | ICD-10-CM

## 2017-05-12 DIAGNOSIS — E785 Hyperlipidemia, unspecified: Secondary | ICD-10-CM

## 2017-05-12 DIAGNOSIS — I700 Atherosclerosis of aorta: Secondary | ICD-10-CM | POA: Diagnosis not present

## 2017-05-12 DIAGNOSIS — Z1159 Encounter for screening for other viral diseases: Secondary | ICD-10-CM

## 2017-05-12 DIAGNOSIS — N3281 Overactive bladder: Secondary | ICD-10-CM | POA: Diagnosis not present

## 2017-05-12 DIAGNOSIS — Z1211 Encounter for screening for malignant neoplasm of colon: Secondary | ICD-10-CM

## 2017-05-12 DIAGNOSIS — F17210 Nicotine dependence, cigarettes, uncomplicated: Secondary | ICD-10-CM

## 2017-05-12 DIAGNOSIS — Z124 Encounter for screening for malignant neoplasm of cervix: Secondary | ICD-10-CM | POA: Diagnosis not present

## 2017-05-12 LAB — HEMOCCULT GUIAC POC 1CARD (OFFICE): FECAL OCCULT BLD: NEGATIVE

## 2017-05-12 MED ORDER — OXYBUTYNIN CHLORIDE ER 5 MG PO TB24: 5.0000 mg | ORAL_TABLET | Freq: Every day | ORAL | 0 refills | Status: DC

## 2017-05-12 NOTE — Progress Notes (Signed)
Name: Samantha Irwin   MRN: [id number]    DOB: July 14, 1960   Date:05/12/2017       Progress Note  Subjective  Chief Complaint  Chief Complaint  Patient presents with  . Abdominal Pain    needs pap    Patient presents for annual physical exam.   No problem-specific Assessment & Plan notes found for this encounter.   Past Medical History:  Diagnosis Date  . Hypertension   . Wears dentures    full upper, partial lower    Past Surgical History:  Procedure Laterality Date  . CESAREAN SECTION    . COLONOSCOPY  2012   cleared for 5 yrs- [place]  . COLONOSCOPY WITH PROPOFOL N/A 11/02/2016   Procedure: COLONOSCOPY WITH PROPOFOL;  Surgeon: Toney Reil, MD;  Location: [facility];  Service: Gastroenterology;  Laterality: N/A;    Family History  Problem Relation Age of Onset  . Diabetes Mother   . Breast cancer Neg Hx     Social History   Socioeconomic History  . Marital status: Divorced    Spouse name: Not on file  . Number of children: Not on file  . Years of education: Not on file  . Highest education level: Not on file  Occupational History  . Not on file  Social Needs  . Financial resource strain: Not on file  . Food insecurity:    Worry: Not on file    Inability: Not on file  . Transportation needs:    Medical: Not on file    Non-medical: Not on file  Tobacco Use  . Smoking status: Current Some Day Smoker    Packs/day: 0.00    Types: Cigarettes  . Smokeless tobacco: Never Used  . Tobacco comment: now smokes 2-3 cigs/week  Substance and Sexual Activity  . Alcohol use: Yes    Alcohol/week: 1.8 oz    Types: 3 Glasses of wine per week  . Drug use: No  . Sexual activity: Yes  Lifestyle  . Physical activity:    Days per week: Not on file    Minutes per session: Not on file  . Stress: Not on file  Relationships  . Social connections:    Talks on phone: Not on file    Gets together: Not on file    Attends religious service: Not on  file    Active member of club or organization: Not on file    Attends meetings of clubs or organizations: Not on file    Relationship status: Not on file  . Intimate partner violence:    Fear of current or ex partner: Not on file    Emotionally abused: Not on file    Physically abused: Not on file    Forced sexual activity: Not on file  Other Topics Concern  . Not on file  Social History Narrative  . Not on file    No Known Allergies  Outpatient Medications Prior to Visit  Medication Sig Dispense Refill  . albuterol (PROVENTIL HFA;VENTOLIN HFA) 108 (90 Base) MCG/ACT inhaler Inhale 2 puffs into the lungs every 6 (six) hours as needed for wheezing or shortness of breath. 1 Inhaler 0  . amLODipine (NORVASC) 5 MG tablet Take 1 tablet (5 mg total) by mouth daily. 30 tablet 0  . B Complex Vitamins (B COMPLEX PO) Take by mouth daily.    . Biotin 10 MG TABS Take 1 tablet by mouth daily.    . cholecalciferol (VITAMIN D) 1000 UNITS  tablet Take 1,000 Units by mouth daily.    . Multiple Vitamins-Minerals (MULTIVITAMIN WOMEN 50+ PO) Take 1 tablet by mouth daily.    . Omega-3 Fatty Acids (FISH OIL) 1000 MG CAPS Take 1 capsule by mouth daily.    Marland Kitchen triamcinolone ointment (KENALOG) 0.5 % Apply 1 application topically 2 (two) times daily. 30 g 0  . vitamin C (ASCORBIC ACID) 250 MG tablet Take 250 mg by mouth daily.     No facility-administered medications prior to visit.     Review of Systems  Constitutional: Negative for chills, fever, malaise/fatigue and weight loss.  HENT: Negative for ear discharge, ear pain and sore throat.   Eyes: Negative for blurred vision.  Respiratory: Negative for cough, sputum production, shortness of breath and wheezing.   Cardiovascular: Negative for chest pain, palpitations and leg swelling.  Gastrointestinal: Negative for abdominal pain, blood in stool, constipation, diarrhea, heartburn, melena and nausea.  Genitourinary: Negative for dysuria, frequency,  hematuria and urgency.  Musculoskeletal: Negative for back pain, joint pain, myalgias and neck pain.  Skin: Negative for rash.  Neurological: Negative for dizziness, tingling, sensory change, focal weakness and headaches.  Endo/Heme/Allergies: Negative for environmental allergies and polydipsia. Does not bruise/bleed easily.  Psychiatric/Behavioral: Negative for depression and suicidal ideas. The patient is not nervous/anxious and does not have insomnia.      Objective  Vitals:   05/12/17 0853  BP: (!) 130/96  Pulse: 60  Weight: 129 lb (58.5 kg)  Height: 5\' 1"  (1.549 m)    Physical Exam  Constitutional: She is oriented to person, place, and time and well-developed, well-nourished, and in no distress. Vital signs are normal. No distress.  HENT:  Head: Normocephalic and atraumatic.  Right Ear: Hearing, tympanic membrane, external ear and ear canal normal. No decreased hearing is noted.  Left Ear: Hearing, tympanic membrane, external ear and ear canal normal. No decreased hearing is noted.  Nose: Nose normal.  Mouth/Throat: Uvula is midline, oropharynx is clear and moist and mucous membranes are normal.  Eyes: Pupils are equal, round, and reactive to light. Conjunctivae, EOM and lids are normal. Right eye exhibits no discharge. Left eye exhibits no discharge.  Fundoscopic exam:      The right eye shows no arteriolar narrowing and no AV nicking.       The left eye shows no arteriolar narrowing and no AV nicking.  Neck: Trachea normal and normal range of motion. Neck supple. Normal carotid pulses, no hepatojugular reflux and no JVD present. Carotid bruit is not present. No neck rigidity. No edema, no erythema and normal range of motion present. No thyromegaly present.  Cardiovascular: Normal rate, regular rhythm, normal heart sounds, intact distal pulses and normal pulses. PMI is not displaced. Exam reveals no gallop and no friction rub.  No murmur heard. Pulses:      Carotid pulses are  2+ on the right side, and 2+ on the left side.      Radial pulses are 2+ on the right side, and 2+ on the left side.       Femoral pulses are 2+ on the right side, and 2+ on the left side.      Popliteal pulses are 2+ on the right side, and 2+ on the left side.       Dorsalis pedis pulses are 2+ on the right side, and 2+ on the left side.       Posterior tibial pulses are 2+ on the right side, and 2+ on  the left side.  Pulmonary/Chest: Effort normal and breath sounds normal. No accessory muscle usage. No respiratory distress. She has no wheezes. She has no rales. Right breast exhibits no inverted nipple, no mass, no nipple discharge, no skin change and no tenderness. Left breast exhibits no inverted nipple, no mass, no nipple discharge, no skin change and no tenderness. Breasts are symmetrical.  Abdominal: Soft. Normal aorta and bowel sounds are normal. She exhibits no distension and no mass. There is no hepatosplenomegaly. There is no tenderness. There is no guarding and no CVA tenderness.  Genitourinary: Rectum normal, vagina normal, uterus normal, cervix normal, right adnexa normal and vulva normal.  Musculoskeletal: Normal range of motion. She exhibits no edema.       Thoracic back: Normal.       Lumbar back: Normal.  Lymphadenopathy:       Head (right side): No submental and no submandibular adenopathy present.       Head (left side): No submental and no submandibular adenopathy present.    She has no cervical adenopathy.    She has no axillary adenopathy.  Neurological: She is alert and oriented to person, place, and time. She has normal sensation, normal strength, normal reflexes and intact cranial nerves.  Skin: Skin is warm, dry and intact. She is not diaphoretic. No pallor.  Psychiatric: Mood and affect normal.  Nursing note and vitals reviewed.     Assessment & Plan  Problem List Items Addressed This Visit      Cardiovascular and Mediastinum   Atherosclerosis of aorta (HCC)      Other   Annual physical exam - Primary   Relevant Orders   Renal Function Panel   Pap IG (Image Guided)   MM Digital Screening   POCT Occult Blood Stool (Completed)    Other Visit Diagnoses    Cigarette nicotine dependence without complication       Need for hepatitis C screening test       Relevant Orders   Hepatitis C antibody   Encounter for screening for HIV       Relevant Orders   HIV antibody   Hyperlipidemia, unspecified hyperlipidemia type       Relevant Orders   Lipid panel   Cervical cancer screening       Relevant Orders   Pap IG (Image Guided)   Breast cancer screening by mammogram       Relevant Orders   MM Digital Screening   Colon cancer screening       Relevant Orders   POCT Occult Blood Stool (Completed)   Overactive bladder       Relevant Medications   oxybutynin (DITROPAN-XL) 5 MG 24 hr tablet      Meds ordered this encounter  Medications  . oxybutynin (DITROPAN-XL) 5 MG 24 hr tablet    Sig: Take 1 tablet (5 mg total) by mouth at bedtime.    Dispense:  30 tablet    Refill:  0      Dr. Hayden Rasmusseneanna Aylani Spurlock [facility]  05/12/17

## 2017-05-13 LAB — PAP IG (IMAGE GUIDED): PAP Smear Comment: 0

## 2017-05-13 LAB — RENAL FUNCTION PANEL
ALBUMIN: 4.7 g/dL (ref 3.5–5.5)
BUN/Creatinine Ratio: 15 (ref 9–23)
BUN: 15 mg/dL (ref 6–24)
CO2: 25 mmol/L (ref 20–29)
CREATININE: 0.97 mg/dL (ref 0.57–1.00)
Calcium: 10.2 mg/dL (ref 8.7–10.2)
Chloride: 100 mmol/L (ref 96–106)
GFR calc Af Amer: 74 mL/min/{1.73_m2} (ref >59)
GFR, EST NON AFRICAN AMERICAN: 65 mL/min/{1.73_m2} (ref >59)
Glucose: 91 mg/dL (ref 65–99)
Phosphorus: 4 mg/dL (ref 2.5–4.5)
Potassium: 4.3 mmol/L (ref 3.5–5.2)
Sodium: 143 mmol/L (ref 134–144)

## 2017-05-13 LAB — LIPID PANEL
CHOL/HDL RATIO: 3.1 ratio (ref 0.0–4.4)
Cholesterol, Total: 273 mg/dL — ABNORMAL HIGH (ref 100–199)
HDL: 89 mg/dL (ref >39)
LDL CALC: 161 mg/dL — AB (ref 0–99)
TRIGLYCERIDES: 115 mg/dL (ref 0–149)
VLDL Cholesterol Cal: 23 mg/dL (ref 5–40)

## 2017-05-13 LAB — HIV ANTIBODY (ROUTINE TESTING W REFLEX): HIV Screen 4th Generation wRfx: NONREACTIVE

## 2017-05-13 LAB — HEPATITIS C ANTIBODY: Hep C Virus Ab: <0.1 s/co ratio (ref 0.0–0.9)

## 2017-05-13 LAB — SPECIMEN STATUS REPORT

## 2017-05-17 ENCOUNTER — Other Ambulatory Visit: Payer: Self-pay

## 2017-05-24 ENCOUNTER — Inpatient Hospital Stay: Admission: RE | Admit: 2017-05-24 | Payer: BC Managed Care – PPO | Source: Ambulatory Visit

## 2017-05-31 ENCOUNTER — Ambulatory Visit: Payer: BC Managed Care – PPO | Admitting: Family Medicine

## 2017-05-31 ENCOUNTER — Encounter: Payer: Self-pay | Admitting: Family Medicine

## 2017-05-31 VITALS — BP 120/80 | HR 80 | Ht 61.0 in | Wt 129.0 lb

## 2017-05-31 DIAGNOSIS — J449 Chronic obstructive pulmonary disease, unspecified: Secondary | ICD-10-CM

## 2017-05-31 DIAGNOSIS — J209 Acute bronchitis, unspecified: Secondary | ICD-10-CM | POA: Diagnosis not present

## 2017-05-31 DIAGNOSIS — R0602 Shortness of breath: Secondary | ICD-10-CM | POA: Diagnosis not present

## 2017-05-31 DIAGNOSIS — J40 Bronchitis, not specified as acute or chronic: Secondary | ICD-10-CM | POA: Diagnosis not present

## 2017-05-31 DIAGNOSIS — J440 Chronic obstructive pulmonary disease with acute lower respiratory infection: Secondary | ICD-10-CM

## 2017-05-31 DIAGNOSIS — F17200 Nicotine dependence, unspecified, uncomplicated: Secondary | ICD-10-CM

## 2017-05-31 MED ORDER — IPRATROPIUM-ALBUTEROL 0.5-2.5 (3) MG/3ML IN SOLN: 3.0000 mL | Freq: Once | RESPIRATORY_TRACT | Status: DC

## 2017-05-31 MED ORDER — ALBUTEROL SULFATE HFA 108 (90 BASE) MCG/ACT IN AERS: 2.0000 | INHALATION_SPRAY | Freq: Four times a day (QID) | RESPIRATORY_TRACT | 0 refills | Status: DC | PRN

## 2017-05-31 MED ORDER — GUAIFENESIN-CODEINE 100-10 MG/5ML PO SYRP: 5.0000 mL | ORAL_SOLUTION | Freq: Three times a day (TID) | ORAL | 0 refills | Status: DC | PRN

## 2017-05-31 MED ORDER — AZITHROMYCIN 250 MG PO TABS: ORAL_TABLET | ORAL | 0 refills | Status: DC

## 2017-05-31 NOTE — Progress Notes (Signed)
Name: Samantha Irwin   MRN: [id number]    DOB: 1960/04/18   Date:05/31/2017       Progress Note  Subjective  Chief Complaint  Chief Complaint  Patient presents with  . Cough    cong, cough- can't get anything up    Cough  This is a new problem. The current episode started in the past 7 days. The problem has been waxing and waning. The problem occurs every few minutes. The cough is non-productive. Associated symptoms include shortness of breath and wheezing. Pertinent negatives include no chest pain, chills, ear congestion, ear pain, fever, headaches, heartburn, hemoptysis, myalgias, nasal congestion, postnasal drip, rash, rhinorrhea, sore throat, sweats or weight loss. The symptoms are aggravated by pollens. Risk factors for lung disease include smoking/tobacco exposure. She has tried a beta-agonist inhaler for the symptoms. The treatment provided mild relief. Her past medical history is significant for COPD and emphysema. There is no history of environmental allergies.  Shortness of Breath  This is a new problem. The current episode started yesterday. The problem has been gradually worsening. Associated symptoms include wheezing. Pertinent negatives include no abdominal pain, chest pain, claudication, ear pain, fever, headaches, hemoptysis, leg swelling, neck pain, orthopnea, rash, rhinorrhea, sore throat or sputum production. The symptoms are aggravated by smoke (supine position). She has tried beta agonist inhalers and steroid inhalers for the symptoms. Her past medical history is significant for COPD.    No problem-specific Assessment & Plan notes found for this encounter.   Past Medical History:  Diagnosis Date  . Hypertension   . Wears dentures    full upper, partial lower    Past Surgical History:  Procedure Laterality Date  . CESAREAN SECTION    . COLONOSCOPY  2012   cleared for 5 yrs- [place]  . COLONOSCOPY WITH PROPOFOL N/A 11/02/2016   Procedure: COLONOSCOPY WITH  PROPOFOL;  Surgeon: Toney Reil, MD;  Location: [facility];  Service: Gastroenterology;  Laterality: N/A;    Family History  Problem Relation Age of Onset  . Diabetes Mother   . Breast cancer Neg Hx     Social History   Socioeconomic History  . Marital status: Divorced    Spouse name: Not on file  . Number of children: Not on file  . Years of education: Not on file  . Highest education level: Not on file  Occupational History  . Not on file  Social Needs  . Financial resource strain: Not on file  . Food insecurity:    Worry: Not on file    Inability: Not on file  . Transportation needs:    Medical: Not on file    Non-medical: Not on file  Tobacco Use  . Smoking status: Current Some Day Smoker    Packs/day: 0.00    Types: Cigarettes  . Smokeless tobacco: Never Used  . Tobacco comment: gave info on patches and pills  Substance and Sexual Activity  . Alcohol use: Yes    Alcohol/week: 1.8 oz    Types: 3 Glasses of wine per week  . Drug use: No  . Sexual activity: Yes  Lifestyle  . Physical activity:    Days per week: Not on file    Minutes per session: Not on file  . Stress: Not on file  Relationships  . Social connections:    Talks on phone: Not on file    Gets together: Not on file    Attends religious service: Not on file  Active member of club or organization: Not on file    Attends meetings of clubs or organizations: Not on file    Relationship status: Not on file  . Intimate partner violence:    Fear of current or ex partner: Not on file    Emotionally abused: Not on file    Physically abused: Not on file    Forced sexual activity: Not on file  Other Topics Concern  . Not on file  Social History Narrative  . Not on file    No Known Allergies  Outpatient Medications Prior to Visit  Medication Sig Dispense Refill  . amLODipine (NORVASC) 5 MG tablet Take 1 tablet (5 mg total) by mouth daily. 30 tablet 0  . B Complex Vitamins (B  COMPLEX PO) Take by mouth daily.    . Biotin 10 MG TABS Take 1 tablet by mouth daily.    . cholecalciferol (VITAMIN D) 1000 UNITS tablet Take 1,000 Units by mouth daily.    . Multiple Vitamins-Minerals (MULTIVITAMIN WOMEN 50+ PO) Take 1 tablet by mouth daily.    . Omega-3 Fatty Acids (FISH OIL) 1000 MG CAPS Take 1 capsule by mouth daily.    Marland Kitchen oxybutynin (DITROPAN-XL) 5 MG 24 hr tablet Take 1 tablet (5 mg total) by mouth at bedtime. 30 tablet 0  . triamcinolone ointment (KENALOG) 0.5 % Apply 1 application topically 2 (two) times daily. 30 g 0  . vitamin C (ASCORBIC ACID) 250 MG tablet Take 250 mg by mouth daily.    Marland Kitchen albuterol (PROVENTIL HFA;VENTOLIN HFA) 108 (90 Base) MCG/ACT inhaler Inhale 2 puffs into the lungs every 6 (six) hours as needed for wheezing or shortness of breath. 1 Inhaler 0   No facility-administered medications prior to visit.     Review of Systems  Constitutional: Negative for chills, fever, malaise/fatigue and weight loss.  HENT: Negative for ear discharge, ear pain, postnasal drip, rhinorrhea and sore throat.   Eyes: Negative for blurred vision.  Respiratory: Positive for cough, shortness of breath and wheezing. Negative for hemoptysis and sputum production.   Cardiovascular: Negative for chest pain, palpitations, orthopnea, claudication and leg swelling.  Gastrointestinal: Negative for abdominal pain, blood in stool, constipation, diarrhea, heartburn, melena and nausea.  Genitourinary: Negative for dysuria, frequency, hematuria and urgency.  Musculoskeletal: Negative for back pain, joint pain, myalgias and neck pain.  Skin: Negative for rash.  Neurological: Negative for dizziness, tingling, sensory change, focal weakness and headaches.  Endo/Heme/Allergies: Negative for environmental allergies and polydipsia. Does not bruise/bleed easily.  Psychiatric/Behavioral: Negative for depression and suicidal ideas. The patient is not nervous/anxious and does not have insomnia.       Objective  Vitals:   05/31/17 1406  BP: 120/80  Pulse: 80  Weight: 129 lb (58.5 kg)  Height: 5\' 1"  (1.549 m)    Physical Exam  Constitutional: She is oriented to person, place, and time. She appears well-developed and well-nourished.  HENT:  Head: Normocephalic.  Right Ear: External ear normal.  Left Ear: External ear normal.  Mouth/Throat: Oropharynx is clear and moist.  Eyes: Pupils are equal, round, and reactive to light. Conjunctivae and EOM are normal. Lids are everted and swept, no foreign bodies found. Left eye exhibits no hordeolum. No foreign body present in the left eye. Right conjunctiva is not injected. Left conjunctiva is not injected. No scleral icterus.  Neck: Normal range of motion. Neck supple. No JVD present. No tracheal deviation present. No thyromegaly present.  Cardiovascular: Normal rate, regular rhythm,  normal heart sounds and intact distal pulses. Exam reveals no gallop and no friction rub.  No murmur heard. Pulmonary/Chest: Effort normal and breath sounds normal. No respiratory distress. She has no wheezes. She has no rales.  Abdominal: Soft. Bowel sounds are normal. She exhibits no mass. There is no hepatosplenomegaly. There is no tenderness. There is no rebound and no guarding.  Musculoskeletal: Normal range of motion. She exhibits no edema or tenderness.  Lymphadenopathy:    She has no cervical adenopathy.  Neurological: She is alert and oriented to person, place, and time. She has normal strength. She displays normal reflexes. No cranial nerve deficit.  Skin: Skin is warm. No rash noted.  Psychiatric: She has a normal mood and affect. Her mood appears not anxious. She does not exhibit a depressed mood.  Nursing note and vitals reviewed.     Assessment & Plan  Problem List Items Addressed This Visit    None    Visit Diagnoses    Acute bronchitis with COPD (HCC)    -  Primary   Relevant Medications   albuterol (PROVENTIL HFA;VENTOLIN HFA)  108 (90 Base) MCG/ACT inhaler   azithromycin (ZITHROMAX) 250 MG tablet   guaiFENesin-codeine (ROBITUSSIN AC) 100-10 MG/5ML syrup   ipratropium-albuterol (DUONEB) 0.5-2.5 (3) MG/3ML nebulizer solution 3 mL   Tobacco dependence       Chronic obstructive pulmonary disease, unspecified COPD type (HCC)       Relevant Medications   albuterol (PROVENTIL HFA;VENTOLIN HFA) 108 (90 Base) MCG/ACT inhaler   azithromycin (ZITHROMAX) 250 MG tablet   guaiFENesin-codeine (ROBITUSSIN AC) 100-10 MG/5ML syrup   ipratropium-albuterol (DUONEB) 0.5-2.5 (3) MG/3ML nebulizer solution 3 mL   Shortness of breath       Relevant Medications   albuterol (PROVENTIL HFA;VENTOLIN HFA) 108 (90 Base) MCG/ACT inhaler   Bronchitis       Relevant Medications   azithromycin (ZITHROMAX) 250 MG tablet   guaiFENesin-codeine (ROBITUSSIN AC) 100-10 MG/5ML syrup      Meds ordered this encounter  Medications  . albuterol (PROVENTIL HFA;VENTOLIN HFA) 108 (90 Base) MCG/ACT inhaler    Sig: Inhale 2 puffs into the lungs every 6 (six) hours as needed for wheezing or shortness of breath.    Dispense:  1 Inhaler    Refill:  0  . azithromycin (ZITHROMAX) 250 MG tablet    Sig: 2 today then 1 a day for 4 days    Dispense:  6 tablet    Refill:  0  . guaiFENesin-codeine (ROBITUSSIN AC) 100-10 MG/5ML syrup    Sig: Take 5 mLs by mouth 3 (three) times daily as needed for cough.    Dispense:  100 mL    Refill:  0  . ipratropium-albuterol (DUONEB) 0.5-2.5 (3) MG/3ML nebulizer solution 3 mL      Dr. Hayden Rasmusseneanna Haim Hansson [facility]  05/31/17

## 2017-06-20 ENCOUNTER — Other Ambulatory Visit: Payer: Self-pay | Admitting: Family Medicine

## 2017-06-20 DIAGNOSIS — I10 Essential (primary) hypertension: Secondary | ICD-10-CM

## 2017-12-13 ENCOUNTER — Ambulatory Visit: Payer: BC Managed Care – PPO | Admitting: Family Medicine

## 2017-12-13 ENCOUNTER — Encounter: Payer: Self-pay | Admitting: Family Medicine

## 2017-12-13 VITALS — BP 120/80 | HR 72 | Ht 61.0 in | Wt 133.0 lb

## 2017-12-13 DIAGNOSIS — H6123 Impacted cerumen, bilateral: Secondary | ICD-10-CM

## 2017-12-13 DIAGNOSIS — H6122 Impacted cerumen, left ear: Secondary | ICD-10-CM | POA: Diagnosis not present

## 2017-12-13 DIAGNOSIS — F17200 Nicotine dependence, unspecified, uncomplicated: Secondary | ICD-10-CM | POA: Diagnosis not present

## 2017-12-13 MED ORDER — GUAIFENESIN-CODEINE 100-10 MG/5ML PO SYRP: 5.0000 mL | ORAL_SOLUTION | Freq: Three times a day (TID) | ORAL | 0 refills | Status: DC | PRN

## 2017-12-13 MED ORDER — AMOXICILLIN 500 MG PO CAPS: 500.0000 mg | ORAL_CAPSULE | Freq: Three times a day (TID) | ORAL | 0 refills | Status: DC

## 2017-12-13 MED ORDER — CARBAMIDE PEROXIDE 6.5 % OT SOLN: 5.0000 [drp] | Freq: Two times a day (BID) | OTIC | 0 refills | Status: DC

## 2017-12-13 MED ORDER — ALBUTEROL SULFATE HFA 108 (90 BASE) MCG/ACT IN AERS
2.0000 | INHALATION_SPRAY | Freq: Four times a day (QID) | RESPIRATORY_TRACT | 2 refills | Status: DC | PRN
Start: 2017-12-13 — End: 2018-05-30

## 2017-12-13 NOTE — Progress Notes (Signed)
Date:  12/13/2017   Name:  Samantha Irwin [facility]   DOB:  Jul 02, 1960   MRN:  [id number]   Chief Complaint: Sinusitis (cough with congestion) Sinusitis  This is a new problem. The current episode started today. The problem has been gradually worsening since onset. There has been no fever. The fever has been present for 3 to 4 days. She is experiencing no pain. Associated symptoms include congestion, coughing, shortness of breath and sinus pressure. Pertinent negatives include no chills, diaphoresis, ear pain, headaches, hoarse voice, neck pain, sneezing, sore throat or swollen glands. Past treatments include nothing. The treatment provided moderate relief.     Review of Systems  Constitutional: Negative.  Negative for chills, diaphoresis, fatigue, fever and unexpected weight change.  HENT: Positive for congestion and sinus pressure. Negative for ear discharge, ear pain, hoarse voice, rhinorrhea, sneezing and sore throat.   Eyes: Negative for photophobia, pain, discharge, redness and itching.  Respiratory: Positive for cough and shortness of breath. Negative for wheezing and stridor.   Gastrointestinal: Negative for abdominal pain, blood in stool, constipation, diarrhea, nausea and vomiting.  Endocrine: Negative for cold intolerance, heat intolerance, polydipsia, polyphagia and polyuria.  Genitourinary: Negative for dysuria, flank pain, frequency, hematuria, menstrual problem, pelvic pain, urgency, vaginal bleeding and vaginal discharge.  Musculoskeletal: Negative for arthralgias, back pain, myalgias and neck pain.  Skin: Negative for rash.  Allergic/Immunologic: Negative for environmental allergies and food allergies.  Neurological: Negative for dizziness, weakness, light-headedness, numbness and headaches.  Hematological: Negative for adenopathy. Does not bruise/bleed easily.  Psychiatric/Behavioral: Negative for dysphoric mood. The patient is not nervous/anxious.     Patient Active  Problem List   Diagnosis Date Noted  . Atherosclerosis of aorta (HCC) 05/12/2017  . Screening for colon cancer   . Essential hypertension 06/12/2015  . Annual physical exam 08/05/2014    No Known Allergies  Past Surgical History:  Procedure Laterality Date  . CESAREAN SECTION    . COLONOSCOPY  2012   cleared for 5 yrs- [place]  . COLONOSCOPY WITH PROPOFOL N/A 11/02/2016   Procedure: COLONOSCOPY WITH PROPOFOL;  Surgeon: Toney Reil, MD;  Location: [facility];  Service: Gastroenterology;  Laterality: N/A;    Social History   Tobacco Use  . Smoking status: Current Some Day Smoker    Packs/day: 0.00    Types: Cigarettes  . Smokeless tobacco: Never Used  . Tobacco comment: gave info on patches and pills  Substance Use Topics  . Alcohol use: Yes    Alcohol/week: 3.0 standard drinks    Types: 3 Glasses of wine per week  . Drug use: No     Medication list has been reviewed and updated.  Current Meds  Medication Sig  . albuterol (PROVENTIL HFA;VENTOLIN HFA) 108 (90 Base) MCG/ACT inhaler Inhale 2 puffs into the lungs every 6 (six) hours as needed for wheezing or shortness of breath.  Marland Kitchen amLODipine (NORVASC) 5 MG tablet TAKE 1 TABLET BY MOUTH ONCE DAILY  . B Complex Vitamins (B COMPLEX PO) Take by mouth daily.  . Biotin 10 MG TABS Take 1 tablet by mouth daily.  . cholecalciferol (VITAMIN D) 1000 UNITS tablet Take 1,000 Units by mouth daily.  . Multiple Vitamins-Minerals (MULTIVITAMIN WOMEN 50+ PO) Take 1 tablet by mouth daily.  . Omega-3 Fatty Acids (FISH OIL) 1000 MG CAPS Take 1 capsule by mouth daily.  Marland Kitchen oxybutynin (DITROPAN-XL) 5 MG 24 hr tablet Take 1 tablet (5 mg total) by mouth at bedtime.  Marland Kitchen  triamcinolone ointment (KENALOG) 0.5 % Apply 1 application topically 2 (two) times daily.  . vitamin C (ASCORBIC ACID) 250 MG tablet Take 250 mg by mouth daily.   Current Facility-Administered Medications for the 12/13/17 encounter (Office Visit) with Duanne Limerick, MD   Medication  . ipratropium-albuterol (DUONEB) 0.5-2.5 (3) MG/3ML nebulizer solution 3 mL    PHQ 2/9 Scores 09/13/2016 09/13/2016  PHQ - 2 Score 0 0  PHQ- 9 Score 2 -    Physical Exam  Constitutional: She is oriented to person, place, and time. She appears well-developed and well-nourished.  HENT:  Head: Normocephalic.  Right Ear: External ear normal.  Left Ear: External ear normal.  Mouth/Throat: Oropharynx is clear and moist.  Eyes: Pupils are equal, round, and reactive to light. Conjunctivae and EOM are normal. Lids are everted and swept, no foreign bodies found. Left eye exhibits no hordeolum. No foreign body present in the left eye. Right conjunctiva is not injected. Left conjunctiva is not injected. No scleral icterus.  Neck: Normal range of motion. Neck supple. No JVD present. No tracheal deviation present. No thyromegaly present.  Cardiovascular: Normal rate, regular rhythm, normal heart sounds and intact distal pulses. Exam reveals no gallop and no friction rub.  No murmur heard. Pulmonary/Chest: Effort normal and breath sounds normal. No respiratory distress. She has no wheezes. She has no rales.  Abdominal: Soft. Bowel sounds are normal. She exhibits no mass. There is no hepatosplenomegaly. There is no tenderness. There is no rebound and no guarding.  Musculoskeletal: Normal range of motion. She exhibits no edema or tenderness.  Lymphadenopathy:    She has no cervical adenopathy.  Neurological: She is alert and oriented to person, place, and time. She has normal strength. She displays normal reflexes. No cranial nerve deficit.  Skin: Skin is warm. No rash noted.  Psychiatric: She has a normal mood and affect. Her mood appears not anxious. She does not exhibit a depressed mood.  Nursing note and vitals reviewed.   BP 120/80   Pulse 72   Ht 5\' 1"  (1.549 m)   Wt 133 lb (60.3 kg)   SpO2 99%   BMI 25.13 kg/m   Assessment and Plan:  1. Tobacco dependence Patient has been  advised of the health risks of smoking and counseled concerning cessation of tobacco products. I spent over 3 minutes for discussion and to answer questions.  2. Hearing loss secondary to cerumen impaction, left Attemped irragation of auditory canals partially successful. Patient prescribe debrox.  3. Bilateral impacted cerumen Debrox and bulb syringe prescribed. - carbamide peroxide (DEBROX) 6.5 % OTIC solution; Place 5 drops into both ears 2 (two) times daily.  Dispense: 15 mL; Refill: 0 4. Maxillary sinusitis Acute. Amoxil 500 mg tid for 10 days.   Dr. Hayden [facility]  12/13/2017

## 2017-12-14 ENCOUNTER — Other Ambulatory Visit: Payer: Self-pay | Admitting: Family Medicine

## 2017-12-14 DIAGNOSIS — I10 Essential (primary) hypertension: Secondary | ICD-10-CM

## 2018-01-26 ENCOUNTER — Emergency Department
Admission: EM | Admit: 2018-01-26 | Discharge: 2018-01-26 | Disposition: A | Discharge status: Home / Self Care | Payer: BC Managed Care – PPO | Attending: Emergency Medicine | Admitting: Emergency Medicine

## 2018-01-26 ENCOUNTER — Other Ambulatory Visit: Payer: Self-pay

## 2018-01-26 ENCOUNTER — Emergency Department: Payer: BC Managed Care – PPO

## 2018-01-26 ENCOUNTER — Encounter: Payer: Self-pay | Admitting: Emergency Medicine

## 2018-01-26 ENCOUNTER — Ambulatory Visit (INDEPENDENT_AMBULATORY_CARE_PROVIDER_SITE_OTHER)
Admission: EM | Admit: 2018-01-26 | Discharge: 2018-01-26 | Disposition: A | Discharge status: Home / Self Care | Payer: BC Managed Care – PPO | Source: Home / Self Care | Attending: Family Medicine | Admitting: Family Medicine

## 2018-01-26 DIAGNOSIS — I10 Essential (primary) hypertension: Secondary | ICD-10-CM

## 2018-01-26 DIAGNOSIS — Z79899 Other long term (current) drug therapy: Secondary | ICD-10-CM | POA: Insufficient documentation

## 2018-01-26 DIAGNOSIS — R0789 Other chest pain: Secondary | ICD-10-CM | POA: Insufficient documentation

## 2018-01-26 DIAGNOSIS — R0989 Other specified symptoms and signs involving the circulatory and respiratory systems: Secondary | ICD-10-CM | POA: Diagnosis not present

## 2018-01-26 DIAGNOSIS — R9431 Abnormal electrocardiogram [ECG] [EKG]: Secondary | ICD-10-CM | POA: Insufficient documentation

## 2018-01-26 DIAGNOSIS — R0602 Shortness of breath: Secondary | ICD-10-CM | POA: Insufficient documentation

## 2018-01-26 DIAGNOSIS — F17210 Nicotine dependence, cigarettes, uncomplicated: Secondary | ICD-10-CM | POA: Insufficient documentation

## 2018-01-26 DIAGNOSIS — R079 Chest pain, unspecified: Secondary | ICD-10-CM | POA: Diagnosis not present

## 2018-01-26 LAB — CBC
HCT: 43.4 % (ref 36.0–46.0)
HEMOGLOBIN: 13.5 g/dL (ref 12.0–15.0)
MCH: 29.3 pg (ref 26.0–34.0)
MCHC: 31.1 g/dL (ref 30.0–36.0)
MCV: 94.1 fL (ref 80.0–100.0)
NRBC: 0 % (ref 0.0–0.2)
Platelets: 295 10*3/uL (ref 150–400)
RBC: 4.61 MIL/uL (ref 3.87–5.11)
RDW: 12.2 % (ref 11.5–15.5)
WBC: 5.4 10*3/uL (ref 4.0–10.5)

## 2018-01-26 LAB — TROPONIN I
Troponin I: <0.03 ng/mL (ref <0.03)
Troponin I: <0.03 ng/mL (ref <0.03)

## 2018-01-26 LAB — BASIC METABOLIC PANEL
Anion gap: 10 (ref 5–15)
BUN: 19 mg/dL (ref 6–20)
CHLORIDE: 105 mmol/L (ref 98–111)
CO2: 25 mmol/L (ref 22–32)
CREATININE: 0.83 mg/dL (ref 0.44–1.00)
Calcium: 9.7 mg/dL (ref 8.9–10.3)
GFR calc non Af Amer: >60 mL/min (ref >60)
Glucose, Bld: 100 mg/dL — ABNORMAL HIGH (ref 70–99)
POTASSIUM: 3.6 mmol/L (ref 3.5–5.1)
Sodium: 140 mmol/L (ref 135–145)

## 2018-01-26 MED ORDER — AMLODIPINE BESYLATE 5 MG PO TABS
5.0000 mg | ORAL_TABLET | Freq: Once | ORAL | Status: AC
  Administered 2018-01-26: 5 mg via ORAL
  Filled 2018-01-26: qty 1

## 2018-01-26 MED ORDER — ACETAMINOPHEN 500 MG PO TABS
1000.0000 mg | ORAL_TABLET | Freq: Once | ORAL | Status: AC
  Administered 2018-01-26: 1000 mg via ORAL
  Filled 2018-01-26: qty 2

## 2018-01-26 NOTE — Discharge Instructions (Addendum)
Please seek medical attention for any high fevers, chest pain, shortness of breath, change in behavior, persistent vomiting, bloody stool or any other new or concerning symptoms.  

## 2018-01-26 NOTE — ED Provider Notes (Signed)
2nd troponin <0.03. Discussed this with the patient. Encouraged that she follow up with ENT.    Phineas SemenGoodman, Samantha Berland, MD 01/26/18 1739

## 2018-01-26 NOTE — ED Notes (Signed)
Resumed care from [place] rn.  Pt alert.  No chest pain.  Pt has a headache.  nsr on monitor at 86.  Family wit h pt.  Repeat labs at 1700.  Pt aware.  Iv in place

## 2018-01-26 NOTE — ED Notes (Signed)
ED Provider at bedside. 

## 2018-01-26 NOTE — ED Triage Notes (Signed)
Pt c/o chest tightness, shortness of breath. She reports that she was eating lunch and she got strangles on food. EMS was called but she did not got to the ED. Denies nausea or sweats. The chest tightness and shortness of breath started after the choking inccident.

## 2018-01-26 NOTE — ED Notes (Signed)
Pt assisted up to bathroom. Ambulates safely independently.

## 2018-01-26 NOTE — ED Triage Notes (Signed)
Patient presents to the ED with left sided chest tightness, left arm pain and shortness of breath that began after a choking episode around 11:15am.  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  Patient states for the past couple of days she has had intermittent left arm pain.  Patient is tearful during triage.

## 2018-01-26 NOTE — ED Provider Notes (Signed)
[facility]    CSN: [id number] Arrival date & time: 01/26/18  1249  History   Chief Complaint Chief Complaint  Patient presents with  . Chest Pain  . Shortness of Breath   HPI  57 year old female presents with the above complaints.  Patient was much earlier today and got strangled on some food (corn and black eye peas).  Immediately had difficulty breathing and catching her breath.  How that movement was done.  No reports of exposed foreign body.  EMS was called and she was evaluated.  She elected not to go to the hospital.  She was subsequently brought in by her daughter.  Patient is currently complaining of chest tightness and associated shortness of breath.  Feels like she cannot get a deep breath.  She states that she is having some chest discomfort on the left side.  Goes to her arm as well.  Difficult for her to describe.  States that it just does not feel right.  When given descriptions to choose from, she stated that it felt like pressure.  Mild currently.  No known relieving factors.  No other associated symptoms.  No other complaints.  PMH, Surgical Hx, Family Hx, Social History reviewed and updated as below.  Past Medical History:  Diagnosis Date  . Hypertension   . Wears dentures    full upper, partial lower   Patient Active Problem List   Diagnosis Date Noted  . Atherosclerosis of aorta (HCC) 05/12/2017  . Screening for colon cancer   . Essential hypertension 06/12/2015  . Annual physical exam 08/05/2014   Past Surgical History:  Procedure Laterality Date  . CESAREAN SECTION    . COLONOSCOPY  2012   cleared for 5 yrs- [place]  . COLONOSCOPY WITH PROPOFOL N/A 11/02/2016   Procedure: COLONOSCOPY WITH PROPOFOL;  Surgeon: Toney Reil, MD;  Location: [facility];  Service: Gastroenterology;  Laterality: N/A;    OB History   No obstetric history on file.    Home Medications    Prior to Admission medications   Medication Sig Start  Date End Date Taking? Authorizing Provider  albuterol (PROVENTIL HFA;VENTOLIN HFA) 108 (90 Base) MCG/ACT inhaler Inhale 2 puffs into the lungs every 6 (six) hours as needed for wheezing or shortness of breath. 12/13/17  Yes Duanne Limerick, MD  amLODipine (NORVASC) 5 MG tablet TAKE 1 TABLET BY MOUTH ONCE DAILY 12/14/17  Yes Duanne Limerick, MD  B Complex Vitamins (B COMPLEX PO) Take by mouth daily.   Yes [provider]  Biotin 10 MG TABS Take 1 tablet by mouth daily.   Yes [provider]  cholecalciferol (VITAMIN D) 1000 UNITS tablet Take 1,000 Units by mouth daily.   Yes [provider]  Multiple Vitamins-Minerals (MULTIVITAMIN WOMEN 50+ PO) Take 1 tablet by mouth daily.   Yes [provider]  Omega-3 Fatty Acids (FISH OIL) 1000 MG CAPS Take 1 capsule by mouth daily.   Yes [provider]  oxybutynin (DITROPAN-XL) 5 MG 24 hr tablet Take 1 tablet (5 mg total) by mouth at bedtime. 05/12/17  Yes Duanne Limerick, MD  vitamin C (ASCORBIC ACID) 250 MG tablet Take 250 mg by mouth daily.   Yes [provider]  amoxicillin (AMOXIL) 500 MG capsule Take 1 capsule (500 mg total) by mouth 3 (three) times daily. 12/13/17   Duanne Limerick, MD  carbamide peroxide (DEBROX) 6.5 % OTIC solution Place 5 drops into both ears 2 (two) times  daily. 12/13/17   Duanne LimerickJones, Deanna C, MD  guaiFENesin-codeine (ROBITUSSIN AC) 100-10 MG/5ML syrup Take 5 mLs by mouth 3 (three) times daily as needed for cough. 12/13/17   Duanne LimerickJones, Deanna C, MD  triamcinolone ointment (KENALOG) 0.5 % Apply 1 application topically 2 (two) times daily. 06/25/16   Duanne LimerickJones, Deanna C, MD    Family History Family History  Problem Relation Age of Onset  . Diabetes Mother   . Breast cancer Neg Hx     Social History Social History   Tobacco Use  . Smoking status: Current Some Day Smoker    Packs/day: 0.00    Types: Cigarettes  . Smokeless tobacco: Never Used  . Tobacco comment: gave info on patches and  pills  Substance Use Topics  . Alcohol use: Yes    Alcohol/week: 3.0 standard drinks    Types: 3 Glasses of wine per week  . Drug use: No     Allergies   Patient has no known allergies.   Review of Systems Review of Systems  Respiratory: Positive for chest tightness and shortness of breath.   Cardiovascular: Positive for chest pain.  Psychiatric/Behavioral: The patient is nervous/anxious.    Physical Exam Triage Vital Signs ED Triage Vitals  Enc Vitals Group     BP 01/26/18 1304 (!) 174/99     Pulse Rate 01/26/18 1304 95     Resp 01/26/18 1304 18     Temp 01/26/18 1304 97.8 F (36.6 C)     Temp Source 01/26/18 1304 Oral     SpO2 01/26/18 1304 100 %     Weight 01/26/18 1300 128 lb (58.1 kg)     Height 01/26/18 1300 5' 1.5" (1.562 m)     Head Circumference --      Peak Flow --      Pain Score 01/26/18 1259 7     Pain Loc --      Pain Edu? --      Excl. in GC? --    Updated Vital Signs BP (!) 174/99 (BP Location: Left Arm)   Pulse 95   Temp 97.8 F (36.6 C) (Oral)   Resp 18   Ht 5' 1.5" (1.562 m)   Wt 58.1 kg   SpO2 100%   BMI 23.79 kg/m   Visual Acuity Right Eye Distance:   Left Eye Distance:   Bilateral Distance:    Right Eye Near:   Left Eye Near:    Bilateral Near:     Physical Exam Vitals signs and nursing note reviewed.  Constitutional:      General: She is not in acute distress.    Appearance: She is well-developed.  HENT:     Head: Normocephalic and atraumatic.     Mouth/Throat:     Pharynx: Oropharynx is clear. No posterior oropharyngeal erythema.  Cardiovascular:     Rate and Rhythm: Normal rate and regular rhythm.  Pulmonary:     Effort: Pulmonary effort is normal. No respiratory distress.     Breath sounds: Normal breath sounds.  Abdominal:     General: Abdomen is flat. There is no distension.     Tenderness: There is no abdominal tenderness.  Neurological:     Mental Status: She is alert.  Psychiatric:     Comments: Very  anxious.    UC Treatments / Results  Labs (all labs ordered are listed, but only abnormal results are displayed) Labs Reviewed - No data to display  EKG Interpretation: Normal sinus rhythm with  rate of 94.  Normal intervals.  T wave inversion noted in lead I and aVL.  This is new compared to her only available prior EKG which was in 2005.  Radiology No results found.  Procedures Procedures (including critical care time)  Medications Ordered in UC Medications - No data to display  Initial Impression / Assessment and Plan / UC Course  I have reviewed the triage vital signs and the nursing notes.  Pertinent labs & imaging results that were available during my care of the patient were reviewed by me and considered in my medical decision making (see chart for details).    57 year old female presents with chest tightness/pain and shortness of breath.  I believe that this is secondary to aspiration and laryngospasm.  However, her EKG was obtained and revealed T wave inversion in lead I and aVL.  This is new compared to her only prior EKG.  I discussed this finding with the patient and her family member.  When I did so the patient began to be very upset and seemed to be more short of breath.  I have advised that she go to the ER for serial cardiac enzymes.  Patient and daughter are in agreement.  Final Clinical Impressions(s) / UC Diagnoses   Final diagnoses:  SOB (shortness of breath)  Abnormal EKG   Discharge Instructions   None    ED Prescriptions    None     Controlled Substance Prescriptions [place] Controlled Substance Registry consulted? Not Applicable   Tommie SamsCook, Zyona Pettaway G, DO 01/26/18 1358

## 2018-01-26 NOTE — ED Provider Notes (Signed)
[facility] Emergency Department Provider Note  ____________________________________________   First MD Initiated Contact with Patient 01/26/18 1432     (approximate)  I have reviewed the triage vital signs and the nursing notes.   HISTORY  Chief Complaint Shortness of Breath   HPI Samantha Irwin is a 57 y.o. female with a history of hypertension on amlodipine was presented to the emergency department today complaining of a choking episode.  Says that approximate 1115 this morning she did he did upper lunch and was eating when she began gasping for air after feeling that she was choking on her food.  She says that coworkers attempted the Heimlich maneuver.  The patient continued to gasp for air for several minutes until she was able to return to her baseline breathing status.  However, she is complaining of left-sided chest pain that she describes as a soreness.  It is nonradiating.  Was seen in urgent care and then sent to the emergency department for persistent chest pain with newly seen T wave inversions in 1 and aVL when compared with her previous EKG from 2005.  Patient states that she has choking episodes similar to this that been going on for years but this 1 seems to be worse than her baseline.  Patient states that she has not taken her amlodipine today.   Past Medical History:  Diagnosis Date  . Hypertension   . Wears dentures    full upper, partial lower    Patient Active Problem List   Diagnosis Date Noted  . Atherosclerosis of aorta (HCC) 05/12/2017  . Screening for colon cancer   . Essential hypertension 06/12/2015  . Annual physical exam 08/05/2014    Past Surgical History:  Procedure Laterality Date  . CESAREAN SECTION    . COLONOSCOPY  2012   cleared for 5 yrs- [place]  . COLONOSCOPY WITH PROPOFOL N/A 11/02/2016   Procedure: COLONOSCOPY WITH PROPOFOL;  Surgeon: Toney Reil, MD;  Location: [facility];  Service:  Gastroenterology;  Laterality: N/A;    Prior to Admission medications   Medication Sig Start Date End Date Taking? Authorizing Provider  albuterol (PROVENTIL HFA;VENTOLIN HFA) 108 (90 Base) MCG/ACT inhaler Inhale 2 puffs into the lungs every 6 (six) hours as needed for wheezing or shortness of breath. 12/13/17   Duanne Limerick, MD  amLODipine (NORVASC) 5 MG tablet TAKE 1 TABLET BY MOUTH ONCE DAILY 12/14/17   Duanne Limerick, MD  amoxicillin (AMOXIL) 500 MG capsule Take 1 capsule (500 mg total) by mouth 3 (three) times daily. 12/13/17   Duanne Limerick, MD  B Complex Vitamins (B COMPLEX PO) Take by mouth daily.    [provider]  Biotin 10 MG TABS Take 1 tablet by mouth daily.    [provider]  carbamide peroxide (DEBROX) 6.5 % OTIC solution Place 5 drops into both ears 2 (two) times daily. 12/13/17   Duanne Limerick, MD  cholecalciferol (VITAMIN D) 1000 UNITS tablet Take 1,000 Units by mouth daily.    [provider]  guaiFENesin-codeine (ROBITUSSIN AC) 100-10 MG/5ML syrup Take 5 mLs by mouth 3 (three) times daily as needed for cough. 12/13/17   Duanne Limerick, MD  Multiple Vitamins-Minerals (MULTIVITAMIN WOMEN 50+ PO) Take 1 tablet by mouth daily.    [provider]  Omega-3 Fatty Acids (FISH OIL) 1000 MG CAPS Take 1 capsule by mouth daily.    [provider]  oxybutynin (DITROPAN-XL) 5 MG 24 hr  tablet Take 1 tablet (5 mg total) by mouth at bedtime. 05/12/17   Duanne LimerickJones, Deanna C, MD  triamcinolone ointment (KENALOG) 0.5 % Apply 1 application topically 2 (two) times daily. 06/25/16   Duanne LimerickJones, Deanna C, MD  vitamin C (ASCORBIC ACID) 250 MG tablet Take 250 mg by mouth daily.    [provider]    Allergies Patient has no known allergies.  Family History  Problem Relation Age of Onset  . Diabetes Mother   . Breast cancer Neg Hx     Social History Social History   Tobacco Use  . Smoking status: Current Some Day Smoker    Packs/day: 0.00     Types: Cigarettes  . Smokeless tobacco: Never Used  . Tobacco comment: gave info on patches and pills  Substance Use Topics  . Alcohol use: Yes    Alcohol/week: 3.0 standard drinks    Types: 3 Glasses of wine per week  . Drug use: No    Review of Systems  Constitutional: No fever/chills Eyes: No visual changes. ENT: As above Cardiovascular: As above Respiratory: As above Gastrointestinal: No abdominal pain.  No nausea, no vomiting.  No diarrhea.  No constipation. Genitourinary: Negative for dysuria. Musculoskeletal: Negative for back pain. Skin: Negative for rash. Neurological: Negative for headaches, focal weakness or numbness.   ____________________________________________   PHYSICAL EXAM:  VITAL SIGNS: ED Triage Vitals  Enc Vitals Group     BP 01/26/18 1430 (!) 188/103     Pulse Rate 01/26/18 1422 95     Resp 01/26/18 1422 18     Temp 01/26/18 1422 97.9 F (36.6 C)     Temp Source 01/26/18 1422 Oral     SpO2 01/26/18 1422 99 %     Weight 01/26/18 1424 130 lb (59 kg)     Height 01/26/18 1424 5' 1.5" (1.562 m)     Head Circumference --      Peak Flow --      Pain Score 01/26/18 1423 7     Pain Loc --      Pain Edu? --      Excl. in GC? --     Constitutional: Alert and oriented. Well appearing and in no acute distress. Eyes: Conjunctivae are normal.  Head: Atraumatic. Nose: No congestion/rhinnorhea. Mouth/Throat: Mucous membranes are moist.  There is no swelling or erythema to the posterior pharynx. Neck: No stridor.   Cardiovascular: Normal rate, regular rhythm. Grossly normal heart sounds.  Tenderness to palpation over the left-sided chest wall anteriorly.  No crepitus.  No deformity. Respiratory: Normal respiratory effort.  No retractions. Lungs CTAB. Gastrointestinal: Soft and nontender. No distention.  Musculoskeletal: No lower extremity tenderness nor edema.  No joint effusions. Neurologic:  Normal speech and language. No gross focal neurologic  deficits are appreciated. Skin:  Skin is warm, dry and intact. No rash noted. Psychiatric: Mood and affect are normal. Speech and behavior are normal.  ____________________________________________   LABS (all labs ordered are listed, but only abnormal results are displayed)  Labs Reviewed  BASIC METABOLIC PANEL - Abnormal; Notable for the following components:      Result Value   Glucose, Bld 100 (*)    All other components within normal limits  CBC  TROPONIN I  TROPONIN I   ____________________________________________  EKG  ED ECG REPORT I, Arelia Longestavid M Kyllian Clingerman, the attending physician, personally viewed and interpreted this ECG.   Date: 01/26/2018  EKG Time: 1421  Rate: 99  Rhythm: normal sinus rhythm (  unclear why the machine has right a heart rate of 99 as sinus tachycardia)  Axis: Normal  Intervals:none  ST&T Change: No ST segment elevation or depression.  Single T wave inversion in aVL.  EKG machine reading ST depressions in the inferior leads.  However, this is likely due to the wandering baseline.  ____________________________________________  RADIOLOGY  Chest x-ray without acute process. ____________________________________________   PROCEDURES  Procedure(s) performed:   Procedures  Critical Care performed:   ____________________________________________   INITIAL IMPRESSION / ASSESSMENT AND PLAN / ED COURSE  Pertinent labs & imaging results that were available during my care of the patient were reviewed by me and considered in my medical decision making (see chart for details).  DDX: Choking episode, chest wall pain, pneumonia, aspiration, ACS, uncontrolled hypertension As part of my medical decision making, I reviewed the following data within the electronic MEDICAL RECORD NUMBER Notes from prior urgent care visit  ----------------------------------------- 3:29 PM on 01/26/2018 -----------------------------------------  Patient appears calm at this  time.  Initial lab work reassuring.  Discussed further work-up with the family and the patient.  They had been recommended serial troponins at urgent care.  While I believe the patient's symptomatology is likely secondary to the choking episode I do not think is unreasonable to give a second troponin secondary to the new T wave inversions.  Patient blood pressure is also quite high in the emergency department today.  Because she did not take her amlodipine at home this morning we will give her dose of amlodipine here in the emergency department and reassess.  Signed out to Dr. Derrill KayGoodman.  I discussed with the patient and the family also that the patient will need to follow-up with ENT for these continued swallowing issues. ____________________________________________   FINAL CLINICAL IMPRESSION(S) / ED DIAGNOSES  Choking episode.  Chest pain.  NEW MEDICATIONS STARTED DURING THIS VISIT:  New Prescriptions   No medications on file     Note:  This document was prepared using Dragon voice recognition software and may include unintentional dictation errors.     Myrna BlazerSchaevitz, Taedyn Glasscock Matthew, MD 01/26/18 [phone]

## 2018-03-25 ENCOUNTER — Other Ambulatory Visit: Payer: Self-pay | Admitting: Family Medicine

## 2018-03-25 DIAGNOSIS — I10 Essential (primary) hypertension: Secondary | ICD-10-CM

## 2018-05-30 ENCOUNTER — Encounter: Payer: Self-pay | Admitting: Family Medicine

## 2018-05-30 ENCOUNTER — Other Ambulatory Visit: Payer: Self-pay

## 2018-05-30 ENCOUNTER — Ambulatory Visit (INDEPENDENT_AMBULATORY_CARE_PROVIDER_SITE_OTHER): Payer: BC Managed Care – PPO | Admitting: Family Medicine

## 2018-05-30 VITALS — BP 140/100 | HR 80 | Ht 61.5 in | Wt 130.0 lb

## 2018-05-30 DIAGNOSIS — L2082 Flexural eczema: Secondary | ICD-10-CM

## 2018-05-30 DIAGNOSIS — N3281 Overactive bladder: Secondary | ICD-10-CM

## 2018-05-30 DIAGNOSIS — I700 Atherosclerosis of aorta: Secondary | ICD-10-CM | POA: Diagnosis not present

## 2018-05-30 DIAGNOSIS — I10 Essential (primary) hypertension: Secondary | ICD-10-CM

## 2018-05-30 DIAGNOSIS — J449 Chronic obstructive pulmonary disease, unspecified: Secondary | ICD-10-CM | POA: Diagnosis not present

## 2018-05-30 DIAGNOSIS — F17210 Nicotine dependence, cigarettes, uncomplicated: Secondary | ICD-10-CM

## 2018-05-30 DIAGNOSIS — E7801 Familial hypercholesterolemia: Secondary | ICD-10-CM

## 2018-05-30 MED ORDER — AMLODIPINE BESYLATE 5 MG PO TABS: 5.0000 mg | ORAL_TABLET | Freq: Every day | ORAL | 5 refills | Status: DC

## 2018-05-30 MED ORDER — METOPROLOL SUCCINATE ER 25 MG PO TB24: 25.0000 mg | ORAL_TABLET | Freq: Every day | ORAL | 3 refills | Status: DC

## 2018-05-30 MED ORDER — FISH OIL 1000 MG PO CAPS: 1.0000 | ORAL_CAPSULE | Freq: Every day | ORAL | 3 refills | Status: DC

## 2018-05-30 MED ORDER — OXYBUTYNIN CHLORIDE ER 5 MG PO TB24: 5.0000 mg | ORAL_TABLET | Freq: Every day | ORAL | 5 refills | Status: DC

## 2018-05-30 MED ORDER — ALBUTEROL SULFATE HFA 108 (90 BASE) MCG/ACT IN AERS: 2.0000 | INHALATION_SPRAY | Freq: Four times a day (QID) | RESPIRATORY_TRACT | 11 refills | Status: AC | PRN

## 2018-05-30 MED ORDER — TRIAMCINOLONE ACETONIDE 0.5 % EX OINT: 1.0000 "application " | TOPICAL_OINTMENT | Freq: Two times a day (BID) | CUTANEOUS | 0 refills | Status: DC

## 2018-05-30 NOTE — Progress Notes (Signed)
Date:  05/30/2018   Name:  Samantha Irwin [facility]   DOB:  1960-04-19   MRN:  [id number]   Chief Complaint: Hypertension; overactive bladder (refill oxybutinin); and Rash (refill triamcinolone cream)  Hypertension  This is a chronic problem. The current episode started more than 1 year ago. The problem is controlled. Pertinent negatives include no anxiety, blurred vision, chest pain, headaches, malaise/fatigue, neck pain, orthopnea, palpitations, peripheral edema, PND, shortness of breath or sweats. There are no associated agents to hypertension. Risk factors for coronary artery disease include dyslipidemia, post-menopausal state and smoking/tobacco exposure. Past treatments include calcium channel blockers. The current treatment provides moderate improvement. There are no compliance problems.  There is no history of angina, kidney disease, CAD/MI, CVA, heart failure, left ventricular hypertrophy, PVD or retinopathy. There is no history of chronic renal disease, a hypertension causing med or renovascular disease.  Rash  This is a chronic problem. The current episode started more than 1 year ago. The problem has been waxing and waning since onset. Location: flexural areas. The rash is characterized by redness and itchiness. Pertinent negatives include no anorexia, congestion, cough, diarrhea, eye pain, facial edema, fatigue, fever, joint pain, nail changes, rhinorrhea, shortness of breath, sore throat or vomiting. Past treatments include anti-itch cream. The treatment provided mild relief. Her past medical history is significant for eczema.  Urinary Frequency   This is a chronic problem. The current episode started more than 1 year ago. The problem occurs intermittently. The problem has been gradually worsening. The patient is experiencing no pain. Associated symptoms include frequency and urgency. Pertinent negatives include no chills, discharge, flank pain, hematuria, hesitancy, nausea, sweats or  vomiting. Treatments tried: oxybutinin.  Hyperlipidemia  This is a chronic problem. The current episode started more than 1 year ago. The problem is controlled. Recent lipid tests were reviewed and are normal. She has no history of chronic renal disease, diabetes, hypothyroidism or liver disease. Pertinent negatives include no chest pain, focal sensory loss, focal weakness, leg pain, myalgias or shortness of breath. Current antihyperlipidemic treatment includes diet change (omega 3). There are no compliance problems.     Review of Systems  Constitutional: Negative.  Negative for chills, fatigue, fever, malaise/fatigue and unexpected weight change.  HENT: Negative for congestion, ear discharge, ear pain, rhinorrhea, sinus pressure, sneezing and sore throat.   Eyes: Negative for blurred vision, photophobia, pain, discharge, redness and itching.  Respiratory: Negative for cough, shortness of breath, wheezing and stridor.   Cardiovascular: Negative for chest pain, palpitations, orthopnea and PND.  Gastrointestinal: Negative for abdominal pain, anorexia, blood in stool, constipation, diarrhea, nausea and vomiting.  Endocrine: Negative for cold intolerance, heat intolerance, polydipsia, polyphagia and polyuria.  Genitourinary: Positive for frequency and urgency. Negative for dysuria, flank pain, hematuria, hesitancy, menstrual problem, pelvic pain, vaginal bleeding and vaginal discharge.  Musculoskeletal: Negative for arthralgias, back pain, joint pain, myalgias and neck pain.  Skin: Positive for rash. Negative for nail changes.  Allergic/Immunologic: Negative for environmental allergies and food allergies.  Neurological: Negative for dizziness, focal weakness, weakness, light-headedness, numbness and headaches.  Hematological: Negative for adenopathy. Does not bruise/bleed easily.  Psychiatric/Behavioral: Negative for dysphoric mood. The patient is not nervous/anxious.     Patient Active Problem  List   Diagnosis Date Noted  . Atherosclerosis of aorta (HCC) 05/12/2017  . Screening for colon cancer   . Essential hypertension 06/12/2015  . Annual physical exam 08/05/2014    No Known Allergies  Past Surgical History:  Procedure  Laterality Date  . CESAREAN SECTION    . COLONOSCOPY  2012   [place]  . COLONOSCOPY WITH PROPOFOL N/A 11/02/2016   Procedure: COLONOSCOPY WITH PROPOFOL;  Surgeon: Toney ReilVanga, Rohini Reddy, MD;  [facility];  Service: Gastroenterology;  Laterality: N/A;    Social History   Tobacco Use  . Smoking status: Current Some Day Smoker    Packs/day: 0.00    Types: Cigarettes  . Smokeless tobacco: Never Used  . Tobacco comment: gave info on patches and pills  Substance Use Topics  . Alcohol use: Yes    Alcohol/week: 3.0 standard drinks    Types: 3 Glasses of wine per week  . Drug use: No     Medication list has been reviewed and updated.  Current Meds  Medication Sig  . albuterol (PROVENTIL HFA;VENTOLIN HFA) 108 (90 Base) MCG/ACT inhaler Inhale 2 puffs into the lungs every 6 (six) hours as needed for wheezing or shortness of breath.  Marland Kitchen. amLODipine (NORVASC) 5 MG tablet TAKE 1 TABLET BY MOUTH ONCE DAILY  . B Complex Vitamins (B COMPLEX PO) Take by mouth daily.  . Biotin 10 MG TABS Take 1 tablet by mouth daily.  . carbamide peroxide (DEBROX) 6.5 % OTIC solution Place 5 drops into both ears 2 (two) times daily.  . cholecalciferol (VITAMIN D) 1000 UNITS tablet Take 1,000 Units by mouth daily.  . Multiple Vitamins-Minerals (MULTIVITAMIN WOMEN 50+ PO) Take 1 tablet by mouth daily.  . Omega-3 Fatty Acids (FISH OIL) 1000 MG CAPS Take 1 capsule by mouth daily.  Marland Kitchen. oxybutynin (DITROPAN-XL) 5 MG 24 hr tablet Take 1 tablet (5 mg total) by mouth at bedtime.  . triamcinolone ointment (KENALOG) 0.5 % Apply 1 application topically 2 (two) times daily.  . vitamin C (ASCORBIC ACID) 250 MG tablet Take 250 mg by mouth daily.   Current  Facility-Administered Medications for the 05/30/18 encounter (Office Visit) with Duanne LimerickJones, Caydon Feasel C, MD  Medication  . ipratropium-albuterol (DUONEB) 0.5-2.5 (3) MG/3ML nebulizer solution 3 mL    PHQ 2/9 Scores 05/30/2018 09/13/2016 09/13/2016  PHQ - 2 Score 0 0 0  PHQ- 9 Score 0 2 -    BP Readings from Last 3 Encounters:  05/30/18 (!) 140/100  01/26/18 (!) 183/98  01/26/18 (!) 174/99    Physical Exam Vitals signs and nursing note reviewed.  Constitutional:      General: She is not in acute distress.    Appearance: She is not diaphoretic.  HENT:     Head: Normocephalic and atraumatic.     Right Ear: External ear normal. There is impacted cerumen.     Left Ear: External ear normal. There is impacted cerumen.     Nose: Nose normal.  Eyes:     General:        Right eye: No discharge.        Left eye: No discharge.     Conjunctiva/sclera: Conjunctivae normal.     Pupils: Pupils are equal, round, and reactive to light.  Neck:     Musculoskeletal: Normal range of motion and neck supple.     Thyroid: No thyromegaly.     Vascular: No JVD.  Cardiovascular:     Rate and Rhythm: Normal rate and regular rhythm.     Heart sounds: Normal heart sounds. No murmur. No friction rub. No gallop.   Pulmonary:     Effort: Pulmonary effort is normal.     Breath sounds: Normal breath sounds.  Abdominal:     General: Bowel sounds are normal.     Palpations: Abdomen is soft. There is no mass.     Tenderness: There is no abdominal tenderness. There is no guarding.  Musculoskeletal: Normal range of motion.  Lymphadenopathy:     Cervical: No cervical adenopathy.  Skin:    General: Skin is warm and dry.  Neurological:     Mental Status: She is alert.     Deep Tendon Reflexes: Reflexes are normal and symmetric.     Wt Readings from Last 3 Encounters:  05/30/18 130 lb (59 kg)  01/26/18 130 lb (59 kg)  01/26/18 128 lb (58.1 kg)    BP (!) 140/100   Pulse 80   Ht 5' 1.5" (1.562 m)   Wt 130 lb  (59 kg)   BMI 24.17 kg/m   Assessment and Plan: 1. Essential hypertension Chronic.  Uncontrolled.  Will continue amlodipine 5 mg patient had issues trying to increase this in the past we will add metoprolol 25 mg XL patient is to return in 6 weeks for me to recheck blood pressure we will check a renal function panel. - amLODipine (NORVASC) 5 MG tablet; Take 1 tablet (5 mg total) by mouth daily.  Dispense: 30 tablet; Refill: 5 - metoprolol succinate (TOPROL-XL) 25 MG 24 hr tablet; Take 1 tablet (25 mg total) by mouth daily.  Dispense: 90 tablet; Refill: 3 - Renal Function Panel  2. Overactive bladder Has history of overactive bladder that she takes her medication sporadically.  Patient particularly has issues with nocturia patient is going to resume this on a regular basis.  We will refill her oxybutynin 5 mg. - oxybutynin (DITROPAN-XL) 5 MG 24 hr tablet; Take 1 tablet (5 mg total) by mouth at bedtime.  Dispense: 30 tablet; Refill: 5  3. Atherosclerosis of aorta [facility]) Patient with atherosclerosis of the aorta will continue dose aspirin and will check her lipid panel to evaluate.  4. Chronic obstructive pulmonary disease, unspecified COPD type (HCC) Recurrent.  Currently stable but needs to continue albuterol inhaler particularly during the pollen season.  It was once again addressed her smoking history and seems to want to quit but really do not think she is going to be able to. - albuterol (VENTOLIN HFA) 108 (90 Base) MCG/ACT inhaler; Inhale 2 puffs into the lungs every 6 (six) hours as needed for wheezing or shortness of breath.  Dispense: 1 Inhaler; Refill: 11  5. Familial hypercholesterolemia Patient with a history of elevated LDL this noted on previous labs we will check a lipid panel patient does take omega-3 on a regular basis we may need to continue with low-fat diet as well as the possibility of adding a statin - Omega-3 Fatty Acids (FISH OIL) 1000 MG CAPS; Take 1 capsule (1,000 mg  total) by mouth daily.  Dispense: 100 capsule; Refill: 3 - Lipid panel  6. Flexural eczema Has a history of flexural eczema we will refill triamcinolone ointment to use twice a day. - triamcinolone ointment (KENALOG) 0.5 %; Apply 1 application topically 2 (two) times daily.  Dispense: 30 g; Refill: 0  7. Cigarette nicotine dependence without complication Patient has been advised of the health risks of smoking and counseled concerning cessation of tobacco products. I spent over 3 minutes for discussion and to answer questions.

## 2018-05-30 NOTE — Patient Instructions (Signed)

## 2018-05-31 LAB — LIPID PANEL
Chol/HDL Ratio: 3.2 ratio (ref 0.0–4.4)
Cholesterol, Total: 285 mg/dL — ABNORMAL HIGH (ref 100–199)
HDL: 90 mg/dL (ref >39)
LDL Calculated: 159 mg/dL — ABNORMAL HIGH (ref 0–99)
Triglycerides: 180 mg/dL — ABNORMAL HIGH (ref 0–149)
VLDL Cholesterol Cal: 36 mg/dL (ref 5–40)

## 2018-05-31 LAB — RENAL FUNCTION PANEL
Albumin: 5 g/dL — ABNORMAL HIGH (ref 3.8–4.9)
BUN/Creatinine Ratio: 13 (ref 9–23)
BUN: 12 mg/dL (ref 6–24)
CO2: 26 mmol/L (ref 20–29)
Calcium: 10.7 mg/dL — ABNORMAL HIGH (ref 8.7–10.2)
Chloride: 97 mmol/L (ref 96–106)
Creatinine, Ser: 0.95 mg/dL (ref 0.57–1.00)
GFR calc Af Amer: 76 mL/min/{1.73_m2} (ref >59)
GFR calc non Af Amer: 66 mL/min/{1.73_m2} (ref >59)
Glucose: 104 mg/dL — ABNORMAL HIGH (ref 65–99)
Phosphorus: 4.7 mg/dL — ABNORMAL HIGH (ref 3.0–4.3)
Potassium: 4.1 mmol/L (ref 3.5–5.2)
Sodium: 140 mmol/L (ref 134–144)

## 2018-06-09 ENCOUNTER — Encounter: Payer: Self-pay | Admitting: Family Medicine

## 2018-06-09 ENCOUNTER — Other Ambulatory Visit: Payer: Self-pay

## 2018-06-09 ENCOUNTER — Ambulatory Visit: Payer: BC Managed Care – PPO | Admitting: Family Medicine

## 2018-06-09 VITALS — BP 130/80 | HR 64 | Ht 61.5 in | Wt 130.0 lb

## 2018-06-09 DIAGNOSIS — Z1239 Encounter for other screening for malignant neoplasm of breast: Secondary | ICD-10-CM

## 2018-06-09 DIAGNOSIS — Z1231 Encounter for screening mammogram for malignant neoplasm of breast: Secondary | ICD-10-CM | POA: Diagnosis not present

## 2018-06-09 DIAGNOSIS — S29011A Strain of muscle and tendon of front wall of thorax, initial encounter: Secondary | ICD-10-CM | POA: Diagnosis not present

## 2018-06-09 MED ORDER — ETODOLAC 200 MG PO CAPS: 200.0000 mg | ORAL_CAPSULE | Freq: Two times a day (BID) | ORAL | 1 refills | Status: DC

## 2018-06-09 NOTE — Progress Notes (Signed)
Date:  06/09/2018   Name:  [facility]Mary Delois [facility]   DOB:  09-22-1960   MRN:  [id number]   Chief Complaint: Breast Pain (R) breast- sore at 6- 9:00 when makes sudden movements.)  Right breast pain /onset 3 day ago/ no discharge, tenderness, Been moving stuff.    Review of Systems  Constitutional: Negative.  Negative for chills, fatigue, fever and unexpected weight change.  HENT: Negative for congestion, ear discharge, ear pain, rhinorrhea, sinus pressure, sneezing and sore throat.   Eyes: Negative for photophobia, pain, discharge, redness and itching.  Respiratory: Negative for cough, shortness of breath, wheezing and stridor.   Gastrointestinal: Negative for abdominal pain, blood in stool, constipation, diarrhea, nausea and vomiting.  Endocrine: Negative for cold intolerance, heat intolerance, polydipsia, polyphagia and polyuria.  Genitourinary: Negative for dysuria, flank pain, frequency, hematuria, menstrual problem, pelvic pain, urgency, vaginal bleeding and vaginal discharge.  Musculoskeletal: Negative for arthralgias, back pain and myalgias.  Skin: Negative for rash.  Allergic/Immunologic: Negative for environmental allergies and food allergies.  Neurological: Negative for dizziness, weakness, light-headedness, numbness and headaches.  Hematological: Negative for adenopathy. Does not bruise/bleed easily.  Psychiatric/Behavioral: Negative for dysphoric mood. The patient is not nervous/anxious.     Patient Active Problem List   Diagnosis Date Noted  . Overactive bladder 05/30/2018  . Chronic obstructive pulmonary disease (HCC) 05/30/2018  . Familial hypercholesterolemia 05/30/2018  . Flexural eczema 05/30/2018  . Cigarette nicotine dependence without complication 05/30/2018  . Atherosclerosis of aorta (HCC) 05/12/2017  . Screening for colon cancer   . Essential hypertension 06/12/2015  . Annual physical exam 08/05/2014    No Known Allergies  Past Surgical History:   Procedure Laterality Date  . CESAREAN SECTION    . COLONOSCOPY  2012   [place]rs[place]  . COLONOSCOPY WITH PROPOFOL N/A 11/02/2016   Procedure: COLONOSCOPY WITH PROPOFOL;  Surgeon: Toney ReilVanga, Rohini Reddy, MD;  [facility];  Service: Gastroenterology;  Laterality: N/A;    Social History   Tobacco Use  . Smoking status: Current Some Day Smoker    Packs/day: 0.00    Types: Cigarettes  . Smokeless tobacco: Never Used  . Tobacco comment: gave info on patches and pills  Substance Use Topics  . Alcohol use: Yes    Alcohol/week: 3.0 standard drinks    Types: 3 Glasses of wine per week  . Drug use: No     Medication list has been reviewed and updated.  Current Meds  Medication Sig  . albuterol (VENTOLIN HFA) 108 (90 Base) MCG/ACT inhaler Inhale 2 puffs into the lungs every 6 (six) hours as needed for wheezing or shortness of breath.  Marland Kitchen. amLODipine (NORVASC) 5 MG tablet Take 1 tablet (5 mg total) by mouth daily.  . B Complex Vitamins (B COMPLEX PO) Take by mouth daily.  . Biotin 10 MG TABS Take 1 tablet by mouth daily.  . carbamide peroxide (DEBROX) 6.5 % OTIC solution Place 5 drops into both ears 2 (two) times daily.  . cholecalciferol (VITAMIN D) 1000 UNITS tablet Take 1,000 Units by mouth daily.  . metoprolol succinate (TOPROL-XL) 25 MG 24 hr tablet Take 1 tablet (25 mg total) by mouth daily.  . Multiple Vitamins-Minerals (MULTIVITAMIN WOMEN 50+ PO) Take 1 tablet by mouth daily.  . Omega-3 Fatty Acids (FISH OIL) 1000 MG CAPS Take 1 capsule (1,000 mg total) by mouth daily.  Marland Kitchen. oxybutynin (DITROPAN-XL) 5 MG 24 hr tablet Take 1 tablet (5 mg total) by  mouth at bedtime.  . triamcinolone ointment (KENALOG) 0.5 % Apply 1 application topically 2 (two) times daily.  . vitamin C (ASCORBIC ACID) 250 MG tablet Take 250 mg by mouth daily.   Current Facility-Administered Medications for the 06/09/18 encounter (Office Visit) with Duanne Limerick, MD  Medication  .  ipratropium-albuterol (DUONEB) 0.5-2.5 (3) MG/3ML nebulizer solution 3 mL    PHQ 2/9 Scores 05/30/2018 09/13/2016 09/13/2016  PHQ - 2 Score 0 0 0  PHQ- 9 Score 0 2 -    BP Readings from Last 3 Encounters:  06/09/18 130/80  05/30/18 (!) 140/100  01/26/18 (!) 183/98    Physical Exam Vitals signs and nursing note reviewed.  Chest:     Chest wall: Tenderness present.     Breasts: Breasts are symmetrical.        Right: Normal. No swelling, bleeding, inverted nipple, mass, nipple discharge, skin change or tenderness.        Left: Normal. No swelling, bleeding, inverted nipple, mass, nipple discharge, skin change or tenderness.     Comments: Tender right 7th rib and intercostal area Lymphadenopathy:     Upper Body:     Right upper body: No supraclavicular or axillary adenopathy.     Left upper body: No supraclavicular or axillary adenopathy.     Wt Readings from Last 3 Encounters:  06/09/18 130 lb (59 kg)  05/30/18 130 lb (59 kg)  01/26/18 130 lb (59 kg)    BP 130/80   Pulse 64   Ht 5' 1.5" (1.562 m)   Wt 130 lb (59 kg)   BMI 24.17 kg/m   Assessment and Plan:  1. Intercostal muscle strain, initial encounter Patient has tenderness more so on the chest wall particularly in the intercostal areas and rib in the area of the seventh and eighth rib.  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  Will initiate etodolac 200 mg twice a day for 2 weeks. - etodolac (LODINE) 200 MG capsule; Take 1 capsule (200 mg total) by mouth 2 (two) times daily.  Dispense: 30 capsule; Refill: 1  2. Breast cancer screening In the meantime a breast exam was done on both breasts and there was no palpable mass or tenderness noted.  Patient missed her mammogram last year and this has been submitted for 1 g in the future.  3. Breast cancer screening by mammogram Mammogram requested. - MM 3D SCREEN BREAST BILATERAL; Future

## 2018-07-11 ENCOUNTER — Encounter: Payer: Self-pay | Admitting: Family Medicine

## 2018-07-11 ENCOUNTER — Other Ambulatory Visit: Payer: Self-pay

## 2018-07-11 ENCOUNTER — Ambulatory Visit: Payer: BC Managed Care – PPO | Admitting: Family Medicine

## 2018-07-11 VITALS — BP 140/100 | HR 80 | Ht 61.5 in | Wt 132.0 lb

## 2018-07-11 DIAGNOSIS — F17210 Nicotine dependence, cigarettes, uncomplicated: Secondary | ICD-10-CM | POA: Diagnosis not present

## 2018-07-11 DIAGNOSIS — I10 Essential (primary) hypertension: Secondary | ICD-10-CM | POA: Diagnosis not present

## 2018-07-11 MED ORDER — HYDROCHLOROTHIAZIDE 12.5 MG PO TABS: 12.5000 mg | ORAL_TABLET | Freq: Every day | ORAL | 1 refills | Status: DC

## 2018-07-11 MED ORDER — METOPROLOL SUCCINATE ER 25 MG PO TB24: 25.0000 mg | ORAL_TABLET | Freq: Every day | ORAL | 1 refills | Status: DC

## 2018-07-11 NOTE — Progress Notes (Signed)
Date:  07/11/2018   Name:  Samantha Irwin   DOB:  1960/12/18   MRN:  [id number]   Chief Complaint: Hypertension (follow up)  Hypertension  This is a chronic problem. The current episode started more than 1 year ago. The problem is unchanged. The problem is controlled. Pertinent negatives include no anxiety, blurred vision, chest pain, headaches, malaise/fatigue, neck pain, orthopnea, palpitations, peripheral edema, PND, shortness of breath or sweats. There are no associated agents to hypertension. Past treatments include beta blockers. The current treatment provides mild improvement. There are no compliance problems.  There is no history of angina, kidney disease, CAD/MI, CVA, heart failure, left ventricular hypertrophy, PVD or retinopathy. There is no history of chronic renal disease, a hypertension causing med or renovascular disease.    Review of Systems  Constitutional: Negative.  Negative for chills, fatigue, fever, malaise/fatigue and unexpected weight change.  HENT: Negative for congestion, ear discharge, ear pain, rhinorrhea, sinus pressure, sneezing and sore throat.   Eyes: Negative for blurred vision, photophobia, pain, discharge, redness and itching.  Respiratory: Negative for cough, shortness of breath, wheezing and stridor.   Cardiovascular: Negative for chest pain, palpitations, orthopnea and PND.  Gastrointestinal: Negative for abdominal pain, blood in stool, constipation, diarrhea, nausea and vomiting.  Endocrine: Negative for cold intolerance, heat intolerance, polydipsia, polyphagia and polyuria.  Genitourinary: Negative for dysuria, flank pain, frequency, hematuria, menstrual problem, pelvic pain, urgency, vaginal bleeding and vaginal discharge.  Musculoskeletal: Negative for arthralgias, back pain, myalgias and neck pain.  Skin: Negative for rash.  Allergic/Immunologic: Negative for environmental allergies and food allergies.  Neurological: Negative for dizziness,  weakness, light-headedness, numbness and headaches.  Hematological: Negative for adenopathy. Does not bruise/bleed easily.  Psychiatric/Behavioral: Negative for dysphoric mood. The patient is not nervous/anxious.     Patient Active Problem List   Diagnosis Date Noted  . Overactive bladder 05/30/2018  . Chronic obstructive pulmonary disease (HCC) 05/30/2018  . Familial hypercholesterolemia 05/30/2018  . Flexural eczema 05/30/2018  . Cigarette nicotine dependence without complication 05/30/2018  . Atherosclerosis of aorta (HCC) 05/12/2017  . Screening for colon cancer   . Essential hypertension 06/12/2015  . Annual physical exam 08/05/2014    No Known Allergies  Past Surgical History:  Procedure Laterality Date  . CESAREAN SECTION    . COLONOSCOPY  2012   cleared for 5 yrs- [place]  . COLONOSCOPY WITH PROPOFOL N/A 11/02/2016   Procedure: COLONOSCOPY WITH PROPOFOL;  Surgeon: Toney Reil, MD;  [facility]: [facility];  Service: Gastroenterology;  Laterality: N/A;    Social History   Tobacco Use  . Smoking status: Current Some Day Smoker    Packs/day: 0.00    Types: Cigarettes  . Smokeless tobacco: Never Used  . Tobacco comment: gave info on patches and pills  Substance Use Topics  . Alcohol use: Yes    Alcohol/week: 3.0 standard drinks    Types: 3 Glasses of wine per week  . Drug use: No     Medication list has been reviewed and updated.  Current Meds  Medication Sig  . albuterol (VENTOLIN HFA) 108 (90 Base) MCG/ACT inhaler Inhale 2 puffs into the lungs every 6 (six) hours as needed for wheezing or shortness of breath.  . B Complex Vitamins (B COMPLEX PO) Take by mouth daily.  . Biotin 10 MG TABS Take 1 tablet by mouth daily.  . carbamide peroxide (DEBROX) 6.5 % OTIC solution Place 5 drops into both ears 2 (two) times daily.  Marland Kitchen  cholecalciferol (VITAMIN D) 1000 UNITS tablet Take 1,000 Units by mouth daily.  Marland Kitchen etodolac (LODINE) 200 MG capsule Take 1 capsule  (200 mg total) by mouth 2 (two) times daily.  . metoprolol succinate (TOPROL-XL) 25 MG 24 hr tablet Take 1 tablet (25 mg total) by mouth daily.  . Multiple Vitamins-Minerals (MULTIVITAMIN WOMEN 50+ PO) Take 1 tablet by mouth daily.  . Omega-3 Fatty Acids (FISH OIL) 1000 MG CAPS Take 1 capsule (1,000 mg total) by mouth daily.  Marland Kitchen oxybutynin (DITROPAN-XL) 5 MG 24 hr tablet Take 1 tablet (5 mg total) by mouth at bedtime.  . triamcinolone ointment (KENALOG) 0.5 % Apply 1 application topically 2 (two) times daily.  . vitamin C (ASCORBIC ACID) 250 MG tablet Take 250 mg by mouth daily.   Current Facility-Administered Medications for the 07/11/18 encounter (Office Visit) with Duanne Limerick, MD  Medication  . ipratropium-albuterol (DUONEB) 0.5-2.5 (3) MG/3ML nebulizer solution 3 mL    PHQ 2/9 Scores 05/30/2018 09/13/2016 09/13/2016  PHQ - 2 Score 0 0 0  PHQ- 9 Score 0 2 -    BP Readings from Last 3 Encounters:  06/09/18 130/80  05/30/18 (!) 140/100  01/26/18 (!) 183/98    Physical Exam Vitals signs and nursing note reviewed.  Constitutional:      Appearance: She is well-developed and normal weight.  HENT:     Head: Normocephalic.     Right Ear: Tympanic membrane, ear canal and external ear normal.     Left Ear: Tympanic membrane, ear canal and external ear normal.     Nose: Nose normal. No congestion or rhinorrhea.     Mouth/Throat:     Pharynx: No oropharyngeal exudate or posterior oropharyngeal erythema.  Eyes:     General: Lids are everted, no foreign bodies appreciated. No scleral icterus.       Left eye: No foreign body or hordeolum.     Conjunctiva/sclera: Conjunctivae normal.     Right eye: Right conjunctiva is not injected.     Left eye: Left conjunctiva is not injected.     Pupils: Pupils are equal, round, and reactive to light.  Neck:     Musculoskeletal: Normal range of motion and neck supple. No neck rigidity or muscular tenderness.     Thyroid: No thyromegaly.     Vascular:  No carotid bruit or JVD.     Trachea: No tracheal deviation.  Cardiovascular:     Rate and Rhythm: Normal rate and regular rhythm.     Pulses: Normal pulses.     Heart sounds: Normal heart sounds. No murmur. No friction rub. No gallop.   Pulmonary:     Effort: Pulmonary effort is normal. No respiratory distress.     Breath sounds: Normal breath sounds. No stridor. No wheezing, rhonchi or rales.  Chest:     Chest wall: No tenderness.  Abdominal:     General: Bowel sounds are normal.     Palpations: Abdomen is soft. There is no mass.     Tenderness: There is no abdominal tenderness. There is no guarding or rebound.  Musculoskeletal: Normal range of motion.        General: No tenderness.  Lymphadenopathy:     Cervical: No cervical adenopathy.  Skin:    General: Skin is warm.     Findings: No rash.  Neurological:     Mental Status: She is alert and oriented to person, place, and time.     Cranial Nerves: No cranial nerve deficit.  Deep Tendon Reflexes: Reflexes normal.  Psychiatric:        Mood and Affect: Mood is not anxious or depressed.     Wt Readings from Last 3 Encounters:  07/11/18 132 lb (59.9 kg)  06/09/18 130 lb (59 kg)  05/30/18 130 lb (59 kg)    Pulse 80   Ht 5' 1.5" (1.562 m)   Wt 132 lb (59.9 kg)   BMI 24.54 kg/m   Assessment and Plan:  1. Essential hypertension Chronic.  Controlled.  Continue metoprolol XL 25 mg once a day. - metoprolol succinate (TOPROL-XL) 25 MG 24 hr tablet; Take 1 tablet (25 mg total) by mouth daily.  Dispense: 90 tablet; Refill: 1  2. Cigarette nicotine dependence without complication Patient has been advised of the health risks of smoking and counseled concerning cessation of tobacco products. I spent over 3 minutes for discussion and to answer questions.

## 2018-07-11 NOTE — Patient Instructions (Signed)
DASH Eating Plan  DASH stands for "Dietary Approaches to Stop Hypertension." The DASH eating plan is a healthy eating plan that has been shown to reduce high blood pressure (hypertension). It may also reduce your risk for type 2 diabetes, heart disease, and stroke. The DASH eating plan may also help with weight loss.  What are tips for following this plan?    General guidelines   Avoid eating more than 2,300 mg (milligrams) of salt (sodium) a day. If you have hypertension, you may need to reduce your sodium intake to 1,500 mg a day.   Limit alcohol intake to no more than 1 drink a day for nonpregnant women and 2 drinks a day for men. One drink equals 12 oz of beer, 5 oz of wine, or 1 oz of hard liquor.   Work with your health care provider to maintain a healthy body weight or to lose weight. Ask what an ideal weight is for you.   Get at least 30 minutes of exercise that causes your heart to beat faster (aerobic exercise) most days of the week. Activities may include walking, swimming, or biking.   Work with your health care provider or diet and nutrition specialist (dietitian) to adjust your eating plan to your individual calorie needs.  Reading food labels     Check food labels for the amount of sodium per serving. Choose foods with less than 5 percent of the Daily Value of sodium. Generally, foods with less than 300 mg of sodium per serving fit into this eating plan.   To find whole grains, look for the word "whole" as the first word in the ingredient list.  Shopping   Buy products labeled as "low-sodium" or "no salt added."   Buy fresh foods. Avoid canned foods and premade or frozen meals.  Cooking   Avoid adding salt when cooking. Use salt-free seasonings or herbs instead of table salt or sea salt. Check with your health care provider or pharmacist before using salt substitutes.   Do not fry foods. Cook foods using healthy methods such as baking, boiling, grilling, and broiling instead.   Cook with  heart-healthy oils, such as olive, canola, soybean, or sunflower oil.  Meal planning   Eat a balanced diet that includes:  ? 5 or more servings of fruits and vegetables each day. At each meal, try to fill half of your plate with fruits and vegetables.  ? Up to 6-8 servings of whole grains each day.  ? Less than 6 oz of lean meat, poultry, or fish each day. A 3-oz serving of meat is about the same size as a deck of cards. One egg equals 1 oz.  ? 2 servings of low-fat dairy each day.  ? A serving of nuts, seeds, or beans 5 times each week.  ? Heart-healthy fats. Healthy fats called Omega-3 fatty acids are found in foods such as flaxseeds and coldwater fish, like sardines, salmon, and mackerel.   Limit how much you eat of the following:  ? Canned or prepackaged foods.  ? Food that is high in trans fat, such as fried foods.  ? Food that is high in saturated fat, such as fatty meat.  ? Sweets, desserts, sugary drinks, and other foods with added sugar.  ? Full-fat dairy products.   Do not salt foods before eating.   Try to eat at least 2 vegetarian meals each week.   Eat more home-cooked food and less restaurant, buffet, and fast food.     When eating at a restaurant, ask that your food be prepared with less salt or no salt, if possible.  What foods are recommended?  The items listed may not be a complete list. Talk with your dietitian about what dietary choices are best for you.  Grains  Whole-grain or whole-wheat bread. Whole-grain or whole-wheat pasta. Brown rice. Oatmeal. Quinoa. Bulgur. Whole-grain and low-sodium cereals. Pita bread. Low-fat, low-sodium crackers. Whole-wheat flour tortillas.  Vegetables  Fresh or frozen vegetables (raw, steamed, roasted, or grilled). Low-sodium or reduced-sodium tomato and vegetable juice. Low-sodium or reduced-sodium tomato sauce and tomato paste. Low-sodium or reduced-sodium canned vegetables.  Fruits  All fresh, dried, or frozen fruit. Canned fruit in natural juice (without  added sugar).  Meat and other protein foods  Skinless chicken or turkey. Ground chicken or turkey. Pork with fat trimmed off. Fish and seafood. Egg whites. Dried beans, peas, or lentils. Unsalted nuts, nut butters, and seeds. Unsalted canned beans. Lean cuts of beef with fat trimmed off. Low-sodium, lean deli meat.  Dairy  Low-fat (1%) or fat-free (skim) milk. Fat-free, low-fat, or reduced-fat cheeses. Nonfat, low-sodium ricotta or cottage cheese. Low-fat or nonfat yogurt. Low-fat, low-sodium cheese.  Fats and oils  Soft margarine without trans fats. Vegetable oil. Low-fat, reduced-fat, or light mayonnaise and salad dressings (reduced-sodium). Canola, safflower, olive, soybean, and sunflower oils. Avocado.  Seasoning and other foods  Herbs. Spices. Seasoning mixes without salt. Unsalted popcorn and pretzels. Fat-free sweets.  What foods are not recommended?  The items listed may not be a complete list. Talk with your dietitian about what dietary choices are best for you.  Grains  Baked goods made with fat, such as croissants, muffins, or some breads. Dry pasta or rice meal packs.  Vegetables  Creamed or fried vegetables. Vegetables in a cheese sauce. Regular canned vegetables (not low-sodium or reduced-sodium). Regular canned tomato sauce and paste (not low-sodium or reduced-sodium). Regular tomato and vegetable juice (not low-sodium or reduced-sodium). Pickles. Olives.  Fruits  Canned fruit in a light or heavy syrup. Fried fruit. Fruit in cream or butter sauce.  Meat and other protein foods  Fatty cuts of meat. Ribs. Fried meat. Bacon. Sausage. Bologna and other processed lunch meats. Salami. Fatback. Hotdogs. Bratwurst. Salted nuts and seeds. Canned beans with added salt. Canned or smoked fish. Whole eggs or egg yolks. Chicken or turkey with skin.  Dairy  Whole or 2% milk, cream, and half-and-half. Whole or full-fat cream cheese. Whole-fat or sweetened yogurt. Full-fat cheese. Nondairy creamers. Whipped toppings.  Processed cheese and cheese spreads.  Fats and oils  Butter. Stick margarine. Lard. Shortening. Ghee. Bacon fat. Tropical oils, such as coconut, palm kernel, or palm oil.  Seasoning and other foods  Salted popcorn and pretzels. Onion salt, garlic salt, seasoned salt, table salt, and sea salt. Worcestershire sauce. Tartar sauce. Barbecue sauce. Teriyaki sauce. Soy sauce, including reduced-sodium. Steak sauce. Canned and packaged gravies. Fish sauce. Oyster sauce. Cocktail sauce. Horseradish that you find on the shelf. Ketchup. Mustard. Meat flavorings and tenderizers. Bouillon cubes. Hot sauce and Tabasco sauce. Premade or packaged marinades. Premade or packaged taco seasonings. Relishes. Regular salad dressings.  Where to find more information:   National Heart, Lung, and Blood Institute: www.nhlbi.nih.gov   American Heart Association: www.heart.org  Summary   The DASH eating plan is a healthy eating plan that has been shown to reduce high blood pressure (hypertension). It may also reduce your risk for type 2 diabetes, heart disease, and stroke.   With the   DASH eating plan, you should limit salt (sodium) intake to 2,300 mg a day. If you have hypertension, you may need to reduce your sodium intake to 1,500 mg a day.   When on the DASH eating plan, aim to eat more fresh fruits and vegetables, whole grains, lean proteins, low-fat dairy, and heart-healthy fats.   Work with your health care provider or diet and nutrition specialist (dietitian) to adjust your eating plan to your individual calorie needs.  This information is not intended to replace advice given to you by your health care provider. Make sure you discuss any questions you have with your health care provider.  Document Released: 01/14/2011 Document Revised: 01/19/2016 Document Reviewed: 01/19/2016  Elsevier Interactive Patient Education  2019 [employer] Inc[employer]

## 2018-08-23 ENCOUNTER — Ambulatory Visit
Admission: RE | Admit: 2018-08-23 | Discharge: 2018-08-23 | Disposition: A | Discharge status: Home / Self Care | Payer: BC Managed Care – PPO | Source: Ambulatory Visit | Attending: Family Medicine | Admitting: Family Medicine

## 2018-08-23 ENCOUNTER — Other Ambulatory Visit: Payer: Self-pay

## 2018-08-23 DIAGNOSIS — Z1231 Encounter for screening mammogram for malignant neoplasm of breast: Secondary | ICD-10-CM

## 2018-10-06 ENCOUNTER — Encounter: Payer: Self-pay | Admitting: Family Medicine

## 2018-10-06 ENCOUNTER — Ambulatory Visit: Payer: BC Managed Care – PPO | Admitting: Family Medicine

## 2018-10-06 ENCOUNTER — Other Ambulatory Visit: Payer: Self-pay

## 2018-10-06 VITALS — BP 118/78 | HR 76 | Ht 61.5 in | Wt 130.0 lb

## 2018-10-06 DIAGNOSIS — F329 Major depressive disorder, single episode, unspecified: Secondary | ICD-10-CM | POA: Diagnosis not present

## 2018-10-06 DIAGNOSIS — R630 Anorexia: Secondary | ICD-10-CM | POA: Diagnosis not present

## 2018-10-06 MED ORDER — SERTRALINE HCL 50 MG PO TABS: 50.0000 mg | ORAL_TABLET | Freq: Every day | ORAL | 3 refills | Status: DC

## 2018-10-06 NOTE — Progress Notes (Signed)
Date:  10/06/2018   Name:  [facility]Mary [facility]ois [facility]   DOB:  March 20, 1960   MRN:  [id number]   Chief Complaint: Depression (PHQ9=14 and GAD7=9)  Depression        This is a new problem.  The current episode started more than 1 month ago (since June).   The onset quality is sudden.   The problem occurs constantly.  The problem has been gradually worsening since onset.  Associated symptoms include fatigue, helplessness, hopelessness, insomnia, restlessness, decreased interest, appetite change and sad.  Associated symptoms include no decreased concentration, no body aches, no myalgias, no headaches, no indigestion and no suicidal ideas.  Past treatments include nothing.   Review of Systems  Constitutional: Positive for appetite change and fatigue. Negative for chills, fever and unexpected weight change.  HENT: Negative for congestion, ear discharge, ear pain, rhinorrhea, sinus pressure, sinus pain, sneezing and sore throat.   Eyes: Negative for photophobia, pain, discharge, redness and itching.  Respiratory: Negative for cough, shortness of breath, wheezing and stridor.   Cardiovascular: Negative for chest pain, palpitations and leg swelling.  Gastrointestinal: Negative for abdominal distention, abdominal pain, anal bleeding, blood in stool, constipation, diarrhea, nausea, rectal pain and vomiting.  Endocrine: Negative for cold intolerance, heat intolerance, polydipsia, polyphagia and polyuria.  Genitourinary: Negative for dysuria, flank pain, frequency, hematuria, menstrual problem, pelvic pain, urgency, vaginal bleeding and vaginal discharge.  Musculoskeletal: Negative for arthralgias, back pain and myalgias.  Skin: Negative for rash.  Allergic/Immunologic: Negative for environmental allergies and food allergies.  Neurological: Negative for dizziness, weakness, light-headedness, numbness and headaches.  Hematological: Negative for adenopathy. Does not bruise/bleed easily.   Psychiatric/Behavioral: Positive for depression. Negative for decreased concentration, dysphoric mood and suicidal ideas. The patient has insomnia. The patient is not nervous/anxious.     Patient Active Problem List   Diagnosis Date Noted  . Overactive bladder 05/30/2018  . Chronic obstructive pulmonary disease (HCC) 05/30/2018  . Familial hypercholesterolemia 05/30/2018  . Flexural eczema 05/30/2018  . Cigarette nicotine dependence without complication 05/30/2018  . Atherosclerosis of aorta (HCC) 05/12/2017  . Screening for colon cancer   . Essential hypertension 06/12/2015  . Annual physical exam 08/05/2014    No Known Allergies  Past Surgical History:  Procedure Laterality Date  . CESAREAN SECTION    . COLONOSCOPY  2012   [place]rs[place]  . COLONOSCOPY WITH PROPOFOL N/A 11/02/2016   Procedure: COLONOSCOPY WITH PROPOFOL;  Surgeon: Toney ReilVanga, Rohini Reddy, MD;  [facility];  Service: Gastroenterology;  Laterality: N/A;    Social History   Tobacco Use  . Smoking status: Current Some Day Smoker    Packs/day: 0.00    Types: Cigarettes  . Smokeless tobacco: Never Used  . Tobacco comment: gave info on patches and pills  Substance Use Topics  . Alcohol use: Yes    Alcohol/week: 3.0 standard drinks    Types: 3 Glasses of wine per week  . Drug use: No     Medication list has been reviewed and updated.  Current Meds  Medication Sig  . albuterol (VENTOLIN HFA) 108 (90 Base) MCG/ACT inhaler Inhale 2 puffs into the lungs every 6 (six) hours as needed for wheezing or shortness of breath.  . B Complex Vitamins (B COMPLEX PO) Take by mouth daily.  . Biotin 10 MG TABS Take 1 tablet by mouth daily.  . carbamide peroxide (DEBROX) 6.5 % OTIC solution Place 5 drops into both ears 2 (two) times daily.  .Marland Kitchen  cholecalciferol (VITAMIN D) 1000 UNITS tablet Take 1,000 Units by mouth daily.  . hydrochlorothiazide (HYDRODIURIL) 12.5 MG tablet Take 1 tablet (12.5 mg  total) by mouth daily.  . metoprolol succinate (TOPROL-XL) 25 MG 24 hr tablet Take 1 tablet (25 mg total) by mouth daily.  . Multiple Vitamins-Minerals (MULTIVITAMIN WOMEN 50+ PO) Take 1 tablet by mouth daily.  . Omega-3 Fatty Acids (FISH OIL) 1000 MG CAPS Take 1 capsule (1,000 mg total) by mouth daily.  Marland Kitchen oxybutynin (DITROPAN-XL) 5 MG 24 hr tablet Take 1 tablet (5 mg total) by mouth at bedtime.  . triamcinolone ointment (KENALOG) 0.5 % Apply 1 application topically 2 (two) times daily.  . vitamin C (ASCORBIC ACID) 250 MG tablet Take 250 mg by mouth daily.   Current Facility-Administered Medications for the 10/06/18 encounter (Office Visit) with Duanne Limerick, MD  Medication  . ipratropium-albuterol (DUONEB) 0.5-2.5 (3) MG/3ML nebulizer solution 3 mL    PHQ 2/9 Scores 10/06/2018 05/30/2018 09/13/2016 09/13/2016  PHQ - 2 Score 2 0 0 0  PHQ- 9 Score 14 0 2 -    BP Readings from Last 3 Encounters:  10/06/18 118/78  07/11/18 (!) 140/100  06/09/18 130/80    Physical Exam Vitals signs and nursing note reviewed.  Constitutional:      Appearance: She is well-developed.  HENT:     Head: Normocephalic.     Right Ear: External ear normal.     Left Ear: External ear normal.     Nose: Nose normal.     Mouth/Throat:     Mouth: Mucous membranes are moist.  Eyes:     General: Lids are everted, no foreign bodies appreciated. No scleral icterus.       Left eye: No foreign body or hordeolum.     Conjunctiva/sclera: Conjunctivae normal.     Right eye: Right conjunctiva is not injected.     Left eye: Left conjunctiva is not injected.     Pupils: Pupils are equal, round, and reactive to light.  Neck:     Musculoskeletal: Normal range of motion and neck supple.     Thyroid: No thyromegaly.     Vascular: No JVD.     Trachea: No tracheal deviation.  Cardiovascular:     Rate and Rhythm: Normal rate and regular rhythm.     Heart sounds: Normal heart sounds. No murmur. No friction rub. No gallop.    Pulmonary:     Effort: Pulmonary effort is normal. No respiratory distress.     Breath sounds: Normal breath sounds. No wheezing or rales.  Abdominal:     General: Bowel sounds are normal.     Palpations: Abdomen is soft. There is no hepatomegaly, splenomegaly or mass.     Tenderness: There is no abdominal tenderness. There is no right CVA tenderness, left CVA tenderness, guarding or rebound.  Musculoskeletal: Normal range of motion.        General: No tenderness.  Lymphadenopathy:     Cervical: No cervical adenopathy.  Skin:    General: Skin is warm.     Findings: No rash.  Neurological:     Mental Status: She is alert and oriented to person, place, and time.     Cranial Nerves: No cranial nerve deficit.     Deep Tendon Reflexes: Reflexes normal.  Psychiatric:        Mood and Affect: Mood is not anxious or depressed.     Wt Readings from Last 3 Encounters:  10/06/18 130 lb (  59 kg)  07/11/18 132 lb (59.9 kg)  06/09/18 130 lb (59 kg)    BP 118/78   Pulse 76   Ht 5' 1.5" (1.562 m)   Wt 130 lb (59 kg)   SpO2 99%   BMI 24.17 kg/m   Assessment and Plan: 1. Reactive depression Patient has had a recent change in social circumstances in the past 2 to 3 months.  Patient has noted decrease in appetite but there is not been a significant lost of weight patient has scored a gad of 9 with a PHQ of 14.  Patient is depressed and is tearful in the room.  We will initiate sertraline 50 mg beginning with 25 mg a day for 10 days then progressing to 50 mg a day we will recheck her in 6 weeks. - sertraline (ZOLOFT) 50 MG tablet; Take 1 tablet (50 mg total) by mouth daily. One half tablet a day for 10 days  Dispense: 30 tablet; Refill: 3  2. Decreased appetite Patient's had a decrease in appetite but without significant weight loss.  This is been documented and we will recheck in 6 weeks.

## 2018-11-10 ENCOUNTER — Ambulatory Visit: Payer: BC Managed Care – PPO | Admitting: Family Medicine

## 2018-11-10 ENCOUNTER — Other Ambulatory Visit: Payer: Self-pay

## 2018-11-10 ENCOUNTER — Encounter: Payer: Self-pay | Admitting: Family Medicine

## 2018-11-10 VITALS — BP 120/80 | HR 70 | Ht 61.5 in | Wt 132.0 lb

## 2018-11-10 DIAGNOSIS — Z23 Encounter for immunization: Secondary | ICD-10-CM

## 2018-11-10 DIAGNOSIS — F329 Major depressive disorder, single episode, unspecified: Secondary | ICD-10-CM | POA: Diagnosis not present

## 2018-11-10 MED ORDER — SERTRALINE HCL 50 MG PO TABS: 50.0000 mg | ORAL_TABLET | Freq: Every day | ORAL | 1 refills | Status: DC

## 2018-11-10 NOTE — Progress Notes (Signed)
Date:  11/10/2018   Name:  [facility]   DOB:  December 13, 1960   MRN:  [id number]   Chief Complaint: Depression (PHQ9=1) and influenza vacc need  Depression      The patient presents with depression.  This is a chronic problem.  The current episode started more than 1 year ago.   The onset quality is gradual.   The problem has been gradually improving since onset.  Associated symptoms include no decreased concentration, no fatigue, no helplessness, no hopelessness, does not have insomnia, not irritable, no restlessness, no decreased interest, no appetite change, no body aches, no myalgias, no headaches, no indigestion, not sad and no suicidal ideas.  Past treatments include SSRIs - Selective serotonin reuptake inhibitors.  Compliance with treatment is good.  Previous treatment provided moderate relief.  Past medical history includes depression.     Review of Systems  Constitutional: Negative.  Negative for appetite change, chills, fatigue, fever and unexpected weight change.  HENT: Negative for congestion, ear discharge, ear pain, rhinorrhea, sinus pressure, sneezing and sore throat.   Eyes: Negative for photophobia, pain, discharge, redness and itching.  Respiratory: Negative for cough, shortness of breath, wheezing and stridor.   Gastrointestinal: Negative for abdominal pain, blood in stool, constipation, diarrhea, nausea and vomiting.  Endocrine: Negative for cold intolerance, heat intolerance, polydipsia, polyphagia and polyuria.  Genitourinary: Negative for dysuria, flank pain, frequency, hematuria, menstrual problem, pelvic pain, urgency, vaginal bleeding and vaginal discharge.  Musculoskeletal: Negative for arthralgias, back pain and myalgias.  Skin: Negative for rash.  Allergic/Immunologic: Negative for environmental allergies and food allergies.  Neurological: Negative for dizziness, weakness, light-headedness, numbness and headaches.  Hematological: Negative for adenopathy.  Does not bruise/bleed easily.  Psychiatric/Behavioral: Positive for depression. Negative for decreased concentration, dysphoric mood and suicidal ideas. The patient is not nervous/anxious and does not have insomnia.     Patient Active Problem List   Diagnosis Date Noted  . Overactive bladder 05/30/2018  . Chronic obstructive pulmonary disease (HCC) 05/30/2018  . Familial hypercholesterolemia 05/30/2018  . Flexural eczema 05/30/2018  . Cigarette nicotine dependence without complication 05/30/2018  . Atherosclerosis of aorta (HCC) 05/12/2017  . Screening for colon cancer   . Essential hypertension 06/12/2015  . Annual physical exam 08/05/2014    No Known Allergies  Past Surgical History:  Procedure Laterality Date  . CESAREAN SECTION    . COLONOSCOPY  2012   [place] 5 yrs[place]  . COLONOSCOPY WITH PROPOFOL N/A 11/02/2016   Procedure: COLONOSCOPY WITH PROPOFOL;  Surgeon: Toney ReilVanga, Rohini Reddy, MD;  [facility];  Service: Gastroenterology;  Laterality: N/A;    Social History   Tobacco Use  . Smoking status: Current Some Day Smoker    Packs/day: 0.00    Types: Cigarettes  . Smokeless tobacco: Never Used  . Tobacco comment: gave info on patches and pills  Substance Use Topics  . Alcohol use: Yes    Alcohol/week: 3.0 standard drinks    Types: 3 Glasses of wine per week  . Drug use: No     Medication list has been reviewed and updated.  Current Meds  Medication Sig  . albuterol (VENTOLIN HFA) 108 (90 Base) MCG/ACT inhaler Inhale 2 puffs into the lungs every 6 (six) hours as needed for wheezing or shortness of breath.  . B Complex Vitamins (B COMPLEX PO) Take by mouth daily.  . Biotin 10 MG TABS Take 1 tablet by mouth daily.  . carbamide peroxide (DEBROX) 6.5 %  OTIC solution Place 5 drops into both ears 2 (two) times daily.  . cholecalciferol (VITAMIN D) 1000 UNITS tablet Take 1,000 Units by mouth daily.  . hydrochlorothiazide (HYDRODIURIL) 12.5 MG  tablet Take 1 tablet (12.5 mg total) by mouth daily.  . metoprolol succinate (TOPROL-XL) 25 MG 24 hr tablet Take 1 tablet (25 mg total) by mouth daily.  . Multiple Vitamins-Minerals (MULTIVITAMIN WOMEN 50+ PO) Take 1 tablet by mouth daily.  . Omega-3 Fatty Acids (FISH OIL) 1000 MG CAPS Take 1 capsule (1,000 mg total) by mouth daily.  Marland Kitchen oxybutynin (DITROPAN-XL) 5 MG 24 hr tablet Take 1 tablet (5 mg total) by mouth at bedtime.  . sertraline (ZOLOFT) 50 MG tablet Take 1 tablet (50 mg total) by mouth daily. One half tablet a day for 10 days  . triamcinolone ointment (KENALOG) 0.5 % Apply 1 application topically 2 (two) times daily.  . vitamin C (ASCORBIC ACID) 250 MG tablet Take 250 mg by mouth daily.   Current Facility-Administered Medications for the 11/10/18 encounter (Office Visit) with Duanne Limerick, MD  Medication  . ipratropium-albuterol (DUONEB) 0.5-2.5 (3) MG/3ML nebulizer solution 3 mL    PHQ 2/9 Scores 11/10/2018 10/06/2018 05/30/2018 09/13/2016  PHQ - 2 Score 0 2 0 0  PHQ- 9 Score 1 14 0 2    BP Readings from Last 3 Encounters:  11/10/18 120/80  10/06/18 118/78  07/11/18 (!) 140/100    Physical Exam Vitals signs and nursing note reviewed.  Constitutional:      General: She is not irritable.    Appearance: She is well-developed.  HENT:     Head: Normocephalic.     Right Ear: Tympanic membrane, ear canal and external ear normal. There is no impacted cerumen.     Left Ear: Tympanic membrane, ear canal and external ear normal. There is no impacted cerumen.  Eyes:     General: Lids are everted, no foreign bodies appreciated. No scleral icterus.       Left eye: No foreign body or hordeolum.     Conjunctiva/sclera: Conjunctivae normal.     Right eye: Right conjunctiva is not injected.     Left eye: Left conjunctiva is not injected.     Pupils: Pupils are equal, round, and reactive to light.  Neck:     Musculoskeletal: Normal range of motion and neck supple.     Thyroid: No  thyromegaly.     Vascular: No JVD.     Trachea: No tracheal deviation.  Cardiovascular:     Rate and Rhythm: Normal rate and regular rhythm.     Pulses: Normal pulses.     Heart sounds: Normal heart sounds. No murmur. No friction rub. No gallop.   Pulmonary:     Effort: Pulmonary effort is normal. No respiratory distress.     Breath sounds: Normal breath sounds. No wheezing or rales.  Abdominal:     General: Bowel sounds are normal.     Palpations: Abdomen is soft. There is no mass.     Tenderness: There is no abdominal tenderness. There is no guarding or rebound.  Musculoskeletal: Normal range of motion.        General: No tenderness.  Lymphadenopathy:     Cervical: No cervical adenopathy.  Skin:    General: Skin is warm.     Findings: No rash.  Neurological:     Mental Status: She is alert and oriented to person, place, and time.     Cranial Nerves: No cranial  nerve deficit.     Deep Tendon Reflexes: Reflexes normal.  Psychiatric:        Mood and Affect: Mood is not anxious or depressed.     Wt Readings from Last 3 Encounters:  11/10/18 132 lb (59.9 kg)  10/06/18 130 lb (59 kg)  07/11/18 132 lb (59.9 kg)    BP 120/80   Pulse 70   Ht 5' 1.5" (1.562 m)   Wt 132 lb (59.9 kg)   BMI 24.54 kg/m   Assessment and Plan:  1. Reactive depression Reactive depression which is controlled.  Patient was started on sertraline 50 mg and is doing extremely well with a significant reduction of her PHQ from 16 to today's reading of 1.  Patient has resumed work part-time at [employer] and is very motivated and of good spirits.  Will recheck in 6 months.  In the meantime patient will continue her sertraline 50 mg daily. - sertraline (ZOLOFT) 50 MG tablet; Take 1 tablet (50 mg total) by mouth daily. One half tablet a day for 10 days  Dispense: 90 tablet; Refill: 1

## 2019-01-27 ENCOUNTER — Other Ambulatory Visit: Payer: Self-pay | Admitting: Family Medicine

## 2019-02-16 ENCOUNTER — Encounter: Payer: BC Managed Care – PPO | Admitting: Family Medicine

## 2019-02-27 ENCOUNTER — Other Ambulatory Visit: Payer: Self-pay

## 2019-02-27 ENCOUNTER — Ambulatory Visit (INDEPENDENT_AMBULATORY_CARE_PROVIDER_SITE_OTHER): Payer: BC Managed Care – PPO | Admitting: Family Medicine

## 2019-02-27 ENCOUNTER — Encounter: Payer: Self-pay | Admitting: Family Medicine

## 2019-02-27 VITALS — BP 122/62 | HR 72 | Ht 61.5 in | Wt 133.0 lb

## 2019-02-27 DIAGNOSIS — H6123 Impacted cerumen, bilateral: Secondary | ICD-10-CM

## 2019-02-27 DIAGNOSIS — I10 Essential (primary) hypertension: Secondary | ICD-10-CM | POA: Diagnosis not present

## 2019-02-27 DIAGNOSIS — F17210 Nicotine dependence, cigarettes, uncomplicated: Secondary | ICD-10-CM | POA: Diagnosis not present

## 2019-02-27 DIAGNOSIS — Z0000 Encounter for general adult medical examination without abnormal findings: Secondary | ICD-10-CM | POA: Diagnosis not present

## 2019-02-27 DIAGNOSIS — I700 Atherosclerosis of aorta: Secondary | ICD-10-CM

## 2019-02-27 MED ORDER — CARBAMIDE PEROXIDE 6.5 % OT SOLN: 5.0000 [drp] | Freq: Two times a day (BID) | OTIC | 0 refills | Status: DC

## 2019-02-27 NOTE — Progress Notes (Addendum)
Date:  02/27/2019   Name:  Samantha Irwin   DOB:  01-03-1961   MRN:  [id number]   Chief Complaint: Annual Exam (no pap/ needs breast exam)  Patient is a 59 year old female who presents for a comprehensive physical exam. The patient reports the following problems: none. Health maintenance has been reviewed up to date.   Lab Results  Component Value Date   CREATININE 0.95 05/30/2018   BUN 12 05/30/2018   NA 140 05/30/2018   K 4.1 05/30/2018   CL 97 05/30/2018   CO2 26 05/30/2018   Lab Results  Component Value Date   CHOL 285 (H) 05/30/2018   HDL 90 05/30/2018   LDLCALC 159 (H) 05/30/2018   TRIG 180 (H) 05/30/2018   CHOLHDL 3.2 05/30/2018   No results found for: TSH Lab Results  Component Value Date   HGBA1C 5.2 09/13/2016     Review of Systems  Constitutional: Negative.  Negative for chills, diaphoresis, fatigue, fever and unexpected weight change.  HENT: Negative for congestion, ear discharge, ear pain, hearing loss, rhinorrhea, sinus pressure, sneezing and sore throat.   Eyes: Negative for photophobia, pain, discharge, redness and itching.  Respiratory: Negative for cough, chest tightness, shortness of breath, wheezing and stridor.   Cardiovascular: Negative for chest pain, palpitations and leg swelling.  Gastrointestinal: Negative for abdominal pain, blood in stool, constipation, diarrhea, nausea and vomiting.  Endocrine: Negative for cold intolerance, heat intolerance, polydipsia, polyphagia and polyuria.  Genitourinary: Negative for dysuria, flank pain, frequency, hematuria, menstrual problem, pelvic pain, urgency, vaginal bleeding and vaginal discharge.  Musculoskeletal: Negative for arthralgias, back pain and myalgias.  Skin: Negative for rash.  Allergic/Immunologic: Negative for environmental allergies and food allergies.  Neurological: Negative for dizziness, weakness, light-headedness, numbness and headaches.  Hematological: Negative for adenopathy.  Does not bruise/bleed easily.  Psychiatric/Behavioral: Negative for dysphoric mood. The patient is not nervous/anxious.     Patient Active Problem List   Diagnosis Date Noted  . Overactive bladder 05/30/2018  . Chronic obstructive pulmonary disease (HCC) 05/30/2018  . Familial hypercholesterolemia 05/30/2018  . Flexural eczema 05/30/2018  . Cigarette nicotine dependence without complication 05/30/2018  . Atherosclerosis of aorta (HCC) 05/12/2017  . Screening for colon cancer   . Essential hypertension 06/12/2015  . Annual physical exam 08/05/2014    No Known Allergies  Past Surgical History:  Procedure Laterality Date  . CESAREAN SECTION    . COLONOSCOPY  2012   cleared for 5 yrs- [place]  . COLONOSCOPY WITH PROPOFOL N/A 11/02/2016   Procedure: COLONOSCOPY WITH PROPOFOL;  Surgeon: Toney Reil, MD;  Location: [facility];  Service: Gastroenterology;  Laterality: N/A;    Social History   Tobacco Use  . Smoking status: Current Some Day Smoker    Packs/day: 0.00    Types: Cigarettes  . Smokeless tobacco: Never Used  . Tobacco comment: gave info on patches and pills  Substance Use Topics  . Alcohol use: Yes    Alcohol/week: 3.0 standard drinks    Types: 3 Glasses of wine per week  . Drug use: No     Medication list has been reviewed and updated.  No outpatient medications have been marked as taking for the 02/27/19 encounter (Office Visit) with Duanne Limerick, MD.   Current Facility-Administered Medications for the 02/27/19 encounter (Office Visit) with Duanne Limerick, MD  Medication  . ipratropium-albuterol (DUONEB) 0.5-2.5 (3) MG/3ML nebulizer solution 3 mL    PHQ 2/9 Scores 02/27/2019  11/10/2018 10/06/2018 05/30/2018  PHQ - 2 Score 0 0 2 0  PHQ- 9 Score 0 1 14 0    BP Readings from Last 3 Encounters:  02/27/19 122/62  11/10/18 120/80  10/06/18 118/78    Physical Exam Vitals and nursing note reviewed.  Constitutional:      General: She is not  in acute distress.    Appearance: Normal appearance. She is well-developed and well-groomed. She is not diaphoretic.  HENT:     Head: Normocephalic and atraumatic.     Jaw: There is normal jaw occlusion.     Right Ear: External ear normal. There is impacted cerumen.     Left Ear: External ear normal. There is impacted cerumen.     Nose: Nose normal. No congestion or rhinorrhea.     Right Turbinates: Not swollen.     Left Turbinates: Not swollen.     Mouth/Throat:     Lips: Pink.     Mouth: Mucous membranes are moist.     Dentition: Normal dentition.     Tongue: No lesions.     Palate: No mass and lesions.     Pharynx: Oropharynx is clear. Uvula midline. No pharyngeal swelling, oropharyngeal exudate, posterior oropharyngeal erythema or uvula swelling.  Eyes:     General: Lids are normal.        Right eye: No discharge.        Left eye: No discharge.     Extraocular Movements: Extraocular movements intact.     Conjunctiva/sclera: Conjunctivae normal.     Pupils: Pupils are equal, round, and reactive to light.     Funduscopic exam:    Right eye: Red reflex present.        Left eye: Red reflex present. Neck:     Thyroid: No thyroid mass, thyromegaly or thyroid tenderness.     Vascular: Normal carotid pulses. No carotid bruit, hepatojugular reflux or JVD.     Trachea: Trachea and phonation normal.  Cardiovascular:     Rate and Rhythm: Normal rate and regular rhythm.  No extrasystoles are present.    Chest Wall: PMI is not displaced. No thrill.     Pulses: Normal pulses. No decreased pulses.          Carotid pulses are 2+ on the right side and 2+ on the left side.      Radial pulses are 2+ on the right side and 2+ on the left side.       Femoral pulses are 2+ on the right side and 2+ on the left side.      Popliteal pulses are 2+ on the right side and 2+ on the left side.       Dorsalis pedis pulses are 2+ on the right side and 2+ on the left side.       Posterior tibial pulses are  2+ on the right side and 2+ on the left side.     Heart sounds: Normal heart sounds, S1 normal and S2 normal. No murmur. No systolic murmur. No diastolic murmur. No friction rub. No gallop. No S3 or S4 sounds.   Pulmonary:     Effort: Pulmonary effort is normal.     Breath sounds: Normal breath sounds. No decreased breath sounds, wheezing, rhonchi or rales.  Chest:     Chest wall: No mass.     Breasts:        Right: Normal. No swelling, bleeding, inverted nipple, mass, nipple discharge, skin change or tenderness.  Left: Normal. No swelling, bleeding, inverted nipple, mass, nipple discharge, skin change or tenderness.  Abdominal:     General: Abdomen is flat. Bowel sounds are normal.     Palpations: Abdomen is soft. There is no hepatomegaly, splenomegaly, mass or pulsatile mass.     Tenderness: There is no abdominal tenderness. There is no guarding.  Genitourinary:    Rectum: Normal. Guaiac result negative. No mass.  Musculoskeletal:        General: Normal range of motion.     Cervical back: Normal range of motion and neck supple.     Right lower leg: No edema.     Left lower leg: No edema.  Lymphadenopathy:     Head:     Right side of head: No submental, submandibular or tonsillar adenopathy.     Left side of head: No submental, submandibular or tonsillar adenopathy.     Cervical: No cervical adenopathy.     Right cervical: No superficial, deep or posterior cervical adenopathy.    Left cervical: No superficial, deep or posterior cervical adenopathy.     Upper Body:     Right upper body: No supraclavicular, axillary or pectoral adenopathy.     Left upper body: No supraclavicular, axillary or pectoral adenopathy.     Lower Body: No right inguinal adenopathy. No left inguinal adenopathy.  Skin:    General: Skin is warm and dry.     Capillary Refill: Capillary refill takes less than 2 seconds.     Findings: No abrasion.  Neurological:     Mental Status: She is alert and  oriented to person, place, and time.     Cranial Nerves: Cranial nerves are intact.     Sensory: Sensation is intact. No sensory deficit.     Motor: Motor function is intact.     Deep Tendon Reflexes: Reflexes are normal and symmetric.     Reflex Scores:      Tricep reflexes are 2+ on the right side and 2+ on the left side.      Bicep reflexes are 2+ on the right side and 2+ on the left side.      Brachioradialis reflexes are 2+ on the right side and 2+ on the left side.      Patellar reflexes are 2+ on the right side and 2+ on the left side.      Achilles reflexes are 2+ on the right side and 2+ on the left side. Psychiatric:        Behavior: Behavior is cooperative.     Wt Readings from Last 3 Encounters:  02/27/19 133 lb (60.3 kg)  11/10/18 132 lb (59.9 kg)  10/06/18 130 lb (59 kg)    BP 122/62   Pulse 72   Ht 5' 1.5" (1.562 m)   Wt 133 lb (60.3 kg)   BMI 24.72 kg/m   Assessment and Plan:  1. Annual physical exam No subjective/objective concerns noted on history and physical exam.  Patient's previous appointment in October was reviewed as well as labs.  We will obtain renal function panel and lipid panel today.Lejla Delois Sammons is a 59 y.o. female who presents today for her Complete Annual Exam. She feels well. She reports exercising . She reports she is sleeping well. Immunizations are reviewed and recommendations provided.   Age appropriate screening tests are discussed. Counseling given for risk factor reduction interventions. - Renal Function Panel - Lipid Panel With LDL/HDL Ratio  2. Cigarette nicotine dependence without complication Patient  has been advised of the health risks of smoking and counseled concerning cessation of tobacco products. I spent over 3 minutes for discussion and to answer questions.  3. Bilateral impacted cerumen Patient denies use of Q-tips.  We will refill Debrox to be used on an episodic basis along with bulb syringe. - carbamide peroxide  (DEBROX) 6.5 % OTIC solution; Place 5 drops into both ears 2 (two) times daily.  Dispense: 15 mL; Refill: 0  4. Essential hypertension We will obtain renal function panel for review.  5. Atherosclerosis of aorta (HCC) We will obtain lipid panel with ratio.

## 2019-02-27 NOTE — Patient Instructions (Signed)
Coping with Quitting Smoking  Quitting smoking is a physical and mental challenge. You will face cravings, withdrawal symptoms, and temptation. Before quitting, work with your health care provider to make a plan that can help you cope. Preparation can help you quit and keep you from giving in. How can I cope with cravings? Cravings usually last for 5-10 minutes. If you get through it, the craving will pass. Consider taking the following actions to help you cope with cravings:  Keep your mouth busy: ? Chew sugar-free gum. ? Suck on hard candies or a straw. ? Brush your teeth.  Keep your hands and body busy: ? Immediately change to a different activity when you feel a craving. ? Squeeze or play with a ball. ? Do an activity or a hobby, like making bead jewelry, practicing needlepoint, or working with wood. ? Mix up your normal routine. ? Take a short exercise break. Go for a quick walk or run up and down stairs. ? Spend time in public places where smoking is not allowed.  Focus on doing something kind or helpful for someone else.  Call a friend or family member to talk during a craving.  Join a support group.  Call a quit line, such as 1-800-QUIT-NOW.  Talk with your health care provider about medicines that might help you cope with cravings and make quitting easier for you. How can I deal with withdrawal symptoms? Your body may experience negative effects as it tries to get used to not having nicotine in the system. These effects are called withdrawal symptoms. They may include:  Feeling hungrier than normal.  Trouble concentrating.  Irritability.  Trouble sleeping.  Feeling depressed.  Restlessness and agitation.  Craving a cigarette. To manage withdrawal symptoms:  Avoid places, people, and activities that trigger your cravings.  Remember why you want to quit.  Get plenty of sleep.  Avoid coffee and other caffeinated drinks. These may worsen some of your  symptoms. How can I handle social situations? Social situations can be difficult when you are quitting smoking, especially in the first few weeks. To manage this, you can:  Avoid parties, bars, and other social situations where people might be smoking.  Avoid alcohol.  Leave right away if you have the urge to smoke.  Explain to your family and friends that you are quitting smoking. Ask for understanding and support.  Plan activities with friends or family where smoking is not an option. What are some ways I can cope with stress? Wanting to smoke may cause stress, and stress can make you want to smoke. Find ways to manage your stress. Relaxation techniques can help. For example:  Breathe slowly and deeply, in through your nose and out through your mouth.  Listen to soothing, relaxing music.  Talk with a family member or friend about your stress.  Light a candle.  Soak in a bath or take a shower.  Think about a peaceful place. What are some ways I can prevent weight gain? Be aware that many people gain weight after they quit smoking. However, not everyone does. To keep from gaining weight, have a plan in place before you quit and stick to the plan after you quit. Your plan should include:  Having healthy snacks. When you have a craving, it may help to: ? Eat plain popcorn, crunchy carrots, celery, or other cut vegetables. ? Chew sugar-free gum.  Changing how you eat: ? Eat small portion sizes at meals. ? Eat 4-6 small meals   throughout the day instead of 1-2 large meals a day. ? Be mindful when you eat. Do not watch television or do other things that might distract you as you eat.  Exercising regularly: ? Make time to exercise each day. If you do not have time for a long workout, do short bouts of exercise for 5-10 minutes several times a day. ? Do some form of strengthening exercise, like weight lifting, and some form of aerobic exercise, like running or swimming.  Drinking  plenty of water or other low-calorie or no-calorie drinks. Drink 6-8 glasses of water daily, or as much as instructed by your health care provider. Summary  Quitting smoking is a physical and mental challenge. You will face cravings, withdrawal symptoms, and temptation to smoke again. Preparation can help you as you go through these challenges.  You can cope with cravings by keeping your mouth busy (such as by chewing gum), keeping your body and hands busy, and making calls to family, friends, or a helpline for people who want to quit smoking.  You can cope with withdrawal symptoms by avoiding places where people smoke, avoiding drinks with caffeine, and getting plenty of rest.  Ask your health care provider about the different ways to prevent weight gain, avoid stress, and handle social situations. This information is not intended to replace advice given to you by your health care provider. Make sure you discuss any questions you have with your health care provider. Document Revised: 01/07/2017 Document Reviewed: 01/23/2016 Elsevier Patient Education  2020 [employer]  

## 2019-02-28 ENCOUNTER — Telehealth: Payer: Self-pay

## 2019-02-28 LAB — LIPID PANEL WITH LDL/HDL RATIO
Cholesterol, Total: 259 mg/dL — ABNORMAL HIGH (ref 100–199)
HDL: 78 mg/dL (ref >39)
LDL Chol Calc (NIH): 153 mg/dL — ABNORMAL HIGH (ref 0–99)
LDL/HDL Ratio: 2 ratio (ref 0.0–3.2)
Triglycerides: 157 mg/dL — ABNORMAL HIGH (ref 0–149)
VLDL Cholesterol Cal: 28 mg/dL (ref 5–40)

## 2019-02-28 LAB — RENAL FUNCTION PANEL
Albumin: 4.4 g/dL (ref 3.8–4.9)
BUN/Creatinine Ratio: 14 (ref 12–28)
BUN: 14 mg/dL (ref 8–27)
CO2: 25 mmol/L (ref 20–29)
Calcium: 10.1 mg/dL (ref 8.7–10.3)
Chloride: 102 mmol/L (ref 96–106)
Creatinine, Ser: 1.02 mg/dL — ABNORMAL HIGH (ref 0.57–1.00)
GFR calc Af Amer: 69 mL/min/{1.73_m2} (ref >59)
GFR calc non Af Amer: 60 mL/min/{1.73_m2} (ref >59)
Glucose: 100 mg/dL — ABNORMAL HIGH (ref 65–99)
Phosphorus: 3.9 mg/dL (ref 3.0–4.3)
Potassium: 4.5 mmol/L (ref 3.5–5.2)
Sodium: 142 mmol/L (ref 134–144)

## 2019-02-28 NOTE — Telephone Encounter (Signed)
Calledpt with labs results- no answer

## 2019-03-07 ENCOUNTER — Other Ambulatory Visit: Payer: Self-pay | Admitting: Family Medicine

## 2019-05-25 ENCOUNTER — Other Ambulatory Visit: Payer: Self-pay | Admitting: Family Medicine

## 2019-07-02 ENCOUNTER — Other Ambulatory Visit: Payer: Self-pay | Admitting: Family Medicine

## 2019-07-02 DIAGNOSIS — N3281 Overactive bladder: Secondary | ICD-10-CM

## 2019-08-06 ENCOUNTER — Other Ambulatory Visit: Payer: Self-pay | Admitting: Family Medicine

## 2019-08-06 DIAGNOSIS — I10 Essential (primary) hypertension: Secondary | ICD-10-CM

## 2019-08-06 NOTE — Telephone Encounter (Signed)
Requested Prescriptions  Pending Prescriptions Disp Refills  . metoprolol succinate (TOPROL-XL) 25 MG 24 hr tablet [Pharmacy Med Name: Metoprolol Succinate ER 25 MG Oral Tablet Extended Release 24 Hour] 90 tablet 0    Sig: Take 1 tablet by mouth once daily     Cardiovascular:  Beta Blockers Passed - 08/06/2019  7:01 PM      Passed - Last BP in normal range    BP Readings from Last 1 Encounters:  02/27/19 122/62         Passed - Last Heart Rate in normal range    Pulse Readings from Last 1 Encounters:  02/27/19 72         Passed - Valid encounter within last 6 months    Recent Outpatient Visits          5 months ago Annual physical exam   [facility] Duanne Limerick, MD   8 months ago Reactive depression   [facility] Duanne Limerick, MD   10 months ago Reactive depression   [facility] Duanne Limerick, MD   1 year ago Essential hypertension   [facility] Duanne Limerick, MD   1 year ago Intercostal muscle strain, initial encounter   [facility] Duanne Limerick, MD      Future Appointments            In 2 weeks Duanne Limerick, MD [facility], [facility]

## 2019-08-24 ENCOUNTER — Ambulatory Visit: Payer: Self-pay | Admitting: Family Medicine

## 2019-08-24 ENCOUNTER — Ambulatory Visit: Payer: BC Managed Care – PPO | Admitting: Family Medicine

## 2019-08-24 ENCOUNTER — Other Ambulatory Visit: Payer: Self-pay

## 2019-08-24 ENCOUNTER — Encounter: Payer: Self-pay | Admitting: Family Medicine

## 2019-08-24 VITALS — BP 130/78 | HR 72 | Ht 61.5 in | Wt 132.0 lb

## 2019-08-24 DIAGNOSIS — J449 Chronic obstructive pulmonary disease, unspecified: Secondary | ICD-10-CM

## 2019-08-24 DIAGNOSIS — N3281 Overactive bladder: Secondary | ICD-10-CM

## 2019-08-24 DIAGNOSIS — E7801 Familial hypercholesterolemia: Secondary | ICD-10-CM | POA: Diagnosis not present

## 2019-08-24 DIAGNOSIS — I700 Atherosclerosis of aorta: Secondary | ICD-10-CM

## 2019-08-24 DIAGNOSIS — F17210 Nicotine dependence, cigarettes, uncomplicated: Secondary | ICD-10-CM

## 2019-08-24 DIAGNOSIS — I10 Essential (primary) hypertension: Secondary | ICD-10-CM

## 2019-08-24 MED ORDER — OXYBUTYNIN CHLORIDE ER 5 MG PO TB24: 5.0000 mg | ORAL_TABLET | Freq: Every day | ORAL | 1 refills | Status: DC

## 2019-08-24 MED ORDER — HYDROCHLOROTHIAZIDE 12.5 MG PO TABS: 12.5000 mg | ORAL_TABLET | Freq: Every day | ORAL | 1 refills | Status: DC

## 2019-08-24 MED ORDER — FISH OIL 1000 MG PO CAPS: 1.0000 | ORAL_CAPSULE | Freq: Every day | ORAL | 3 refills | Status: DC

## 2019-08-24 MED ORDER — METOPROLOL SUCCINATE ER 25 MG PO TB24: 25.0000 mg | ORAL_TABLET | Freq: Every day | ORAL | 1 refills | Status: DC

## 2019-08-24 NOTE — Progress Notes (Signed)
Date:  08/24/2019   Name:  Samantha Irwin [facility]   DOB:  04-22-1960   MRN:  [id number]   Chief Complaint: Hypertension and overactive bladder  Hypertension This is a chronic problem. The current episode started more than 1 year ago. The problem has been gradually improving since onset. The problem is controlled. Pertinent negatives include no anxiety, blurred vision, chest pain, headaches, malaise/fatigue, neck pain, orthopnea, palpitations, peripheral edema, PND, shortness of breath or sweats. There are no associated agents to hypertension. Past treatments include diuretics and beta blockers. The current treatment provides moderate improvement. There are no compliance problems.  There is no history of angina, kidney disease, CAD/MI, CVA, heart failure, left ventricular hypertrophy, PVD or retinopathy. There is no history of chronic renal disease, a hypertension causing med or renovascular disease.  Hyperlipidemia This is a chronic problem. The current episode started more than 1 year ago. The problem is controlled. Recent lipid tests were reviewed and are normal. She has no history of chronic renal disease, diabetes, hypothyroidism, obesity or nephrotic syndrome. Pertinent negatives include no chest pain, focal sensory loss, focal weakness, leg pain, myalgias or shortness of breath. Treatments tried: omega 3. The current treatment provides moderate improvement of lipids. There are no compliance problems.  Risk factors for coronary artery disease include dyslipidemia and hypertension.  Urinary Frequency  This is a chronic problem. The current episode started more than 1 year ago. The pain is mild. Associated symptoms include frequency. Pertinent negatives include no chills, discharge, flank pain, hematuria, hesitancy, nausea, sweats, urgency or vomiting. The treatment provided moderate relief.    Lab Results  Component Value Date   CREATININE 1.02 (H) 02/27/2019   BUN 14 02/27/2019   NA 142  02/27/2019   K 4.5 02/27/2019   CL 102 02/27/2019   CO2 25 02/27/2019   Lab Results  Component Value Date   CHOL 259 (H) 02/27/2019   HDL 78 02/27/2019   LDLCALC 153 (H) 02/27/2019   TRIG 157 (H) 02/27/2019   CHOLHDL 3.2 05/30/2018   No results found for: TSH Lab Results  Component Value Date   HGBA1C 5.2 09/13/2016   Lab Results  Component Value Date   WBC 5.4 01/26/2018   HGB 13.5 01/26/2018   HCT 43.4 01/26/2018   MCV 94.1 01/26/2018   PLT 295 01/26/2018   No results found for: ALT, AST, GGT, ALKPHOS, BILITOT   Review of Systems  Constitutional: Negative.  Negative for chills, fatigue, fever, malaise/fatigue and unexpected weight change.  HENT: Negative for congestion, ear discharge, ear pain, rhinorrhea, sinus pressure, sneezing and sore throat.   Eyes: Negative for blurred vision, photophobia, pain, discharge, redness and itching.  Respiratory: Negative for cough, shortness of breath, wheezing and stridor.   Cardiovascular: Negative for chest pain, palpitations, orthopnea and PND.  Gastrointestinal: Negative for abdominal pain, blood in stool, constipation, diarrhea, nausea and vomiting.  Endocrine: Negative for cold intolerance, heat intolerance, polydipsia, polyphagia and polyuria.  Genitourinary: Positive for frequency. Negative for dysuria, flank pain, hematuria, hesitancy, menstrual problem, pelvic pain, urgency, vaginal bleeding and vaginal discharge.  Musculoskeletal: Negative for arthralgias, back pain, myalgias and neck pain.  Skin: Negative for rash.  Allergic/Immunologic: Negative for environmental allergies and food allergies.  Neurological: Negative for dizziness, focal weakness, weakness, light-headedness, numbness and headaches.  Hematological: Negative for adenopathy. Does not bruise/bleed easily.  Psychiatric/Behavioral: Negative for dysphoric mood. The patient is not nervous/anxious.     Patient Active Problem List   Diagnosis Date  Noted  .  Overactive bladder 05/30/2018  . Chronic obstructive pulmonary disease (HCC) 05/30/2018  . Familial hypercholesterolemia 05/30/2018  . Flexural eczema 05/30/2018  . Cigarette nicotine dependence without complication 05/30/2018  . Atherosclerosis of aorta (HCC) 05/12/2017  . Screening for colon cancer   . Essential hypertension 06/12/2015  . Annual physical exam 08/05/2014    No Known Allergies  Past Surgical History:  Procedure Laterality Date  . CESAREAN SECTION    . COLONOSCOPY  2012   cleared for 5 yrs- [place]  . COLONOSCOPY WITH PROPOFOL N/A 11/02/2016   Procedure: COLONOSCOPY WITH PROPOFOL;  Surgeon: Toney Reil, MD;  Location: [facility];  Service: Gastroenterology;  Laterality: N/A;    Social History   Tobacco Use  . Smoking status: Current Some Day Smoker    Packs/day: 0.00    Types: Cigarettes  . Smokeless tobacco: Never Used  . Tobacco comment: gave info on patches and pills  Vaping Use  . Vaping Use: Never used  Substance Use Topics  . Alcohol use: Yes    Alcohol/week: 3.0 standard drinks    Types: 3 Glasses of wine per week  . Drug use: No     Medication list has been reviewed and updated.  Current Meds  Medication Sig  . albuterol (VENTOLIN HFA) 108 (90 Base) MCG/ACT inhaler Inhale 2 puffs into the lungs every 6 (six) hours as needed for wheezing or shortness of breath.  . B Complex Vitamins (B COMPLEX PO) Take by mouth daily.  . Biotin 10 MG TABS Take 1 tablet by mouth daily.  . carbamide peroxide (DEBROX) 6.5 % OTIC solution Place 5 drops into both ears 2 (two) times daily.  . carbamide peroxide (DEBROX) 6.5 % OTIC solution Place 5 drops into both ears 2 (two) times daily.  . cholecalciferol (VITAMIN D) 1000 UNITS tablet Take 1,000 Units by mouth daily.  . hydrochlorothiazide (HYDRODIURIL) 12.5 MG tablet Take 1 tablet by mouth once daily  . metoprolol succinate (TOPROL-XL) 25 MG 24 hr tablet Take 1 tablet by mouth once daily  .  Multiple Vitamins-Minerals (MULTIVITAMIN WOMEN 50+ PO) Take 1 tablet by mouth daily.  . Omega-3 Fatty Acids (FISH OIL) 1000 MG CAPS Take 1 capsule (1,000 mg total) by mouth daily.  Marland Kitchen oxybutynin (DITROPAN-XL) 5 MG 24 hr tablet TAKE 1 TABLET BY MOUTH AT BEDTIME  . triamcinolone ointment (KENALOG) 0.5 % Apply 1 application topically 2 (two) times daily.  . vitamin C (ASCORBIC ACID) 250 MG tablet Take 250 mg by mouth daily.   Current Facility-Administered Medications for the 08/24/19 encounter (Office Visit) with Duanne Limerick, MD  Medication  . ipratropium-albuterol (DUONEB) 0.5-2.5 (3) MG/3ML nebulizer solution 3 mL    PHQ 2/9 Scores 08/24/2019 02/27/2019 11/10/2018 10/06/2018  PHQ - 2 Score 0 0 0 2  PHQ- 9 Score 0 0 1 14    GAD 7 : Generalized Anxiety Score 08/24/2019 02/27/2019 10/06/2018  Nervous, Anxious, on Edge 0 0 1  Control/stop worrying 0 0 3  Worry too much - different things 0 0 3  Trouble relaxing 0 0 1  Restless 0 0 1  Easily annoyed or irritable 0 0 0  Afraid - awful might happen 0 0 0  Total GAD 7 Score 0 0 9  Anxiety Difficulty - - Not difficult at all    BP Readings from Last 3 Encounters:  08/24/19 130/78  02/27/19 122/62  11/10/18 120/80    Physical Exam Vitals and nursing note reviewed.  Constitutional:      Appearance: She is well-developed.  HENT:     Head: Normocephalic.     Right Ear: Tympanic membrane, ear canal and external ear normal.     Left Ear: Tympanic membrane, ear canal and external ear normal.     Nose: Nose normal. No congestion or rhinorrhea.     Mouth/Throat:     Mouth: Mucous membranes are moist.  Eyes:     General: Lids are everted, no foreign bodies appreciated. No scleral icterus.       Left eye: No foreign body or hordeolum.     Conjunctiva/sclera: Conjunctivae normal.     Right eye: Right conjunctiva is not injected.     Left eye: Left conjunctiva is not injected.     Pupils: Pupils are equal, round, and reactive to light.  Neck:      Thyroid: No thyromegaly.     Vascular: No carotid bruit or JVD.     Trachea: No tracheal deviation.  Cardiovascular:     Rate and Rhythm: Normal rate and regular rhythm.     Pulses: Normal pulses.     Heart sounds: Normal heart sounds. No murmur heard.  No friction rub. No gallop.   Pulmonary:     Effort: Pulmonary effort is normal. No respiratory distress.     Breath sounds: Normal breath sounds. No wheezing, rhonchi or rales.  Abdominal:     General: Bowel sounds are normal.     Palpations: Abdomen is soft. There is no mass.     Tenderness: There is no abdominal tenderness. There is no guarding or rebound.  Musculoskeletal:        General: No tenderness. Normal range of motion.     Cervical back: Normal range of motion and neck supple. No tenderness.  Lymphadenopathy:     Cervical: No cervical adenopathy.  Skin:    General: Skin is warm.     Capillary Refill: Capillary refill takes less than 2 seconds.     Findings: No rash.  Neurological:     Mental Status: She is alert and oriented to person, place, and time.     Cranial Nerves: No cranial nerve deficit.     Deep Tendon Reflexes: Reflexes normal.  Psychiatric:        Mood and Affect: Mood is not anxious or depressed.     Wt Readings from Last 3 Encounters:  08/24/19 132 lb (59.9 kg)  02/27/19 133 lb (60.3 kg)  11/10/18 132 lb (59.9 kg)    BP 130/78   Pulse 72   Ht 5' 1.5" (1.562 m)   Wt 132 lb (59.9 kg)   BMI 24.54 kg/m   Assessment and Plan: 1. Essential hypertension Chronic.  Controlled.  Stable.  Continue metoprolol XL 25 mg and hydrochlorothiazide 12.5 mg once a day.  Review of renal function panel was acceptable from January. - metoprolol succinate (TOPROL-XL) 25 MG 24 hr tablet; Take 1 tablet (25 mg total) by mouth daily.  Dispense: 90 tablet; Refill: 1 - hydrochlorothiazide (HYDRODIURIL) 12.5 MG tablet; Take 1 tablet (12.5 mg total) by mouth daily.  Dispense: 90 tablet; Refill: 1  2. Familial  hypercholesterolemia Chronic.  Controlled.  Stable.  Continue omega-3 1 g daily.  Reviewed lipid panel is acceptable from January 2021. - Omega-3 Fatty Acids (FISH OIL) 1000 MG CAPS; Take 1 capsule (1,000 mg total) by mouth daily.  Dispense: 100 capsule; Refill: 3  3. Overactive bladder Chronic.  Controlled.  Stable.  Continue  Ditropan XL 5 mg once a day.. - oxybutynin (DITROPAN-XL) 5 MG 24 hr tablet; Take 1 tablet (5 mg total) by mouth at bedtime.  Dispense: 90 tablet; Refill: 1  4. Aortic atherosclerosis (HCC) Chronic.  Controlled.  Stable.  Controlled by optimal control of her blood pressure, lipid, and low-dose aspirin.  Unfortunately patient continues to smoke  5. Cigarette nicotine dependence without complication Patient has been advised of the health risks of smoking and counseled concerning cessation of tobacco products. I spent over 3 minutes for discussion and to answer questions.  6. Chronic obstructive pulmonary disease, unspecified COPD type (HCC) Chronic.  Controlled.  Stable.  Chest x-ray is shown to be hyperaerated consistent with COPD.  Patient is currently doing well and is only using her inhalers on episodic basis.  Patient does currently use her Ventolin inhaler 2 puffs every 6 hours but only on an as needed.

## 2019-12-21 ENCOUNTER — Other Ambulatory Visit: Payer: Self-pay

## 2019-12-21 ENCOUNTER — Encounter: Payer: Self-pay | Admitting: Family Medicine

## 2019-12-21 ENCOUNTER — Ambulatory Visit: Payer: Self-pay

## 2019-12-21 ENCOUNTER — Ambulatory Visit: Payer: BC Managed Care – PPO | Admitting: Family Medicine

## 2019-12-21 VITALS — BP 120/80 | HR 88 | Temp 98.1°F | Ht 61.5 in | Wt 132.0 lb

## 2019-12-21 DIAGNOSIS — J0100 Acute maxillary sinusitis, unspecified: Secondary | ICD-10-CM | POA: Diagnosis not present

## 2019-12-21 MED ORDER — AMOXICILLIN 500 MG PO CAPS: 500.0000 mg | ORAL_CAPSULE | Freq: Three times a day (TID) | ORAL | 0 refills | Status: DC

## 2019-12-21 NOTE — Progress Notes (Signed)
Date:  12/21/2019   Name:  Samantha Irwin [facility]   DOB:  1961-01-17   MRN:  [id number]   Chief Complaint: Sinusitis  Sinusitis This is a new problem. The current episode started in the past 7 days. The problem has been waxing and waning since onset. There has been no fever. Associated symptoms include congestion, shortness of breath, sinus pressure and sneezing. Pertinent negatives include no chills, coughing, diaphoresis, ear pain, headaches, hoarse voice, neck pain, sore throat or swollen glands. Treatments tried: albuterol. The treatment provided mild relief.    Lab Results  Component Value Date   CREATININE 1.02 (H) 02/27/2019   BUN 14 02/27/2019   NA 142 02/27/2019   K 4.5 02/27/2019   CL 102 02/27/2019   CO2 25 02/27/2019   Lab Results  Component Value Date   CHOL 259 (H) 02/27/2019   HDL 78 02/27/2019   LDLCALC 153 (H) 02/27/2019   TRIG 157 (H) 02/27/2019   CHOLHDL 3.2 05/30/2018   No results found for: TSH Lab Results  Component Value Date   HGBA1C 5.2 09/13/2016   Lab Results  Component Value Date   WBC 5.4 01/26/2018   HGB 13.5 01/26/2018   HCT 43.4 01/26/2018   MCV 94.1 01/26/2018   PLT 295 01/26/2018   No results found for: ALT, AST, GGT, ALKPHOS, BILITOT   Review of Systems  Constitutional: Negative.  Negative for chills, diaphoresis, fatigue, fever and unexpected weight change.  HENT: Positive for congestion, sinus pressure and sneezing. Negative for ear discharge, ear pain, hoarse voice, rhinorrhea and sore throat.   Eyes: Negative for photophobia, pain, discharge, redness and itching.  Respiratory: Positive for shortness of breath. Negative for cough, wheezing and stridor.   Gastrointestinal: Negative for abdominal pain, blood in stool, constipation, diarrhea, nausea and vomiting.  Endocrine: Negative for cold intolerance, heat intolerance, polydipsia, polyphagia and polyuria.  Genitourinary: Negative for dysuria, flank pain, frequency,  hematuria, menstrual problem, pelvic pain, urgency, vaginal bleeding and vaginal discharge.  Musculoskeletal: Negative for arthralgias, back pain, myalgias and neck pain.  Skin: Negative for rash.  Allergic/Immunologic: Negative for environmental allergies and food allergies.  Neurological: Negative for dizziness, weakness, light-headedness, numbness and headaches.  Hematological: Negative for adenopathy. Does not bruise/bleed easily.  Psychiatric/Behavioral: Negative for dysphoric mood. The patient is not nervous/anxious.     Patient Active Problem List   Diagnosis Date Noted  . Overactive bladder 05/30/2018  . Chronic obstructive pulmonary disease (HCC) 05/30/2018  . Familial hypercholesterolemia 05/30/2018  . Flexural eczema 05/30/2018  . Cigarette nicotine dependence without complication 05/30/2018  . Atherosclerosis of aorta (HCC) 05/12/2017  . Screening for colon cancer   . Essential hypertension 06/12/2015  . Annual physical exam 08/05/2014    No Known Allergies  Past Surgical History:  Procedure Laterality Date  . CESAREAN SECTION    . COLONOSCOPY  2012   cleared for 5 yrs- [place]  . COLONOSCOPY WITH PROPOFOL N/A 11/02/2016   Procedure: COLONOSCOPY WITH PROPOFOL;  Surgeon: Toney Reil, MD;  Location: [facility];  Service: Gastroenterology;  Laterality: N/A;    Social History   Tobacco Use  . Smoking status: Former Smoker    Packs/day: 0.00    Types: Cigarettes  . Smokeless tobacco: Never Used  . Tobacco comment: gave info on patches and pills  Vaping Use  . Vaping Use: Never used  Substance Use Topics  . Alcohol use: Yes    Alcohol/week: 3.0 standard drinks    Types:  3 Glasses of wine per week  . Drug use: No     Medication list has been reviewed and updated.  Current Meds  Medication Sig  . albuterol (VENTOLIN HFA) 108 (90 Base) MCG/ACT inhaler Inhale 2 puffs into the lungs every 6 (six) hours as needed for wheezing or shortness of  breath.  . B Complex Vitamins (B COMPLEX PO) Take by mouth daily.  . Biotin 10 MG TABS Take 1 tablet by mouth daily.  . carbamide peroxide (DEBROX) 6.5 % OTIC solution Place 5 drops into both ears 2 (two) times daily.  . cholecalciferol (VITAMIN D) 1000 UNITS tablet Take 1,000 Units by mouth daily.  . hydrochlorothiazide (HYDRODIURIL) 12.5 MG tablet Take 1 tablet (12.5 mg total) by mouth daily.  . metoprolol succinate (TOPROL-XL) 25 MG 24 hr tablet Take 1 tablet (25 mg total) by mouth daily.  . Multiple Vitamins-Minerals (MULTIVITAMIN WOMEN 50+ PO) Take 1 tablet by mouth daily.  . Omega-3 Fatty Acids (FISH OIL) 1000 MG CAPS Take 1 capsule (1,000 mg total) by mouth daily.  Marland Kitchen oxybutynin (DITROPAN-XL) 5 MG 24 hr tablet Take 1 tablet (5 mg total) by mouth at bedtime.  . triamcinolone ointment (KENALOG) 0.5 % Apply 1 application topically 2 (two) times daily.  . vitamin C (ASCORBIC ACID) 250 MG tablet Take 250 mg by mouth daily.  . [DISCONTINUED] carbamide peroxide (DEBROX) 6.5 % OTIC solution Place 5 drops into both ears 2 (two) times daily.   Current Facility-Administered Medications for the 12/21/19 encounter (Office Visit) with Duanne Limerick, MD  Medication  . ipratropium-albuterol (DUONEB) 0.5-2.5 (3) MG/3ML nebulizer solution 3 mL    PHQ 2/9 Scores 08/24/2019 02/27/2019 11/10/2018 10/06/2018  PHQ - 2 Score 0 0 0 2  PHQ- 9 Score 0 0 1 14    GAD 7 : Generalized Anxiety Score 08/24/2019 02/27/2019 10/06/2018  Nervous, Anxious, on Edge 0 0 1  Control/stop worrying 0 0 3  Worry too much - different things 0 0 3  Trouble relaxing 0 0 1  Restless 0 0 1  Easily annoyed or irritable 0 0 0  Afraid - awful might happen 0 0 0  Total GAD 7 Score 0 0 9  Anxiety Difficulty - - Not difficult at all    BP Readings from Last 3 Encounters:  12/21/19 120/80  08/24/19 130/78  02/27/19 122/62    Physical Exam Vitals and nursing note reviewed.  Constitutional:      Appearance: She is  well-developed.  HENT:     Head: Normocephalic.     Right Ear: Tympanic membrane, ear canal and external ear normal. There is no impacted cerumen.     Left Ear: Tympanic membrane, ear canal and external ear normal. There is no impacted cerumen.     Nose: Nose normal. No congestion or rhinorrhea.     Mouth/Throat:     Mouth: Mucous membranes are moist.  Eyes:     General: Lids are everted, no foreign bodies appreciated. No scleral icterus.       Left eye: No foreign body or hordeolum.     Conjunctiva/sclera: Conjunctivae normal.     Right eye: Right conjunctiva is not injected.     Left eye: Left conjunctiva is not injected.     Pupils: Pupils are equal, round, and reactive to light.  Neck:     Thyroid: No thyromegaly.     Vascular: No carotid bruit or JVD.     Trachea: No tracheal deviation.  Cardiovascular:  Rate and Rhythm: Normal rate and regular rhythm.     Heart sounds: Normal heart sounds. No murmur heard.  No friction rub. No gallop.   Pulmonary:     Effort: Pulmonary effort is normal. No respiratory distress.     Breath sounds: Normal breath sounds. No stridor. No wheezing, rhonchi or rales.  Chest:     Chest wall: No tenderness.  Abdominal:     General: Bowel sounds are normal.     Palpations: Abdomen is soft. There is no mass.     Tenderness: There is no abdominal tenderness. There is no guarding or rebound.  Musculoskeletal:        General: No tenderness. Normal range of motion.     Cervical back: Normal range of motion and neck supple. No tenderness.  Lymphadenopathy:     Cervical: No cervical adenopathy.  Skin:    General: Skin is warm.     Findings: No rash.  Neurological:     General: No focal deficit present.     Mental Status: She is alert and oriented to person, place, and time.     Cranial Nerves: No cranial nerve deficit.     Deep Tendon Reflexes: Reflexes normal.  Psychiatric:        Mood and Affect: Mood is not anxious or depressed.     Wt  Readings from Last 3 Encounters:  12/21/19 132 lb (59.9 kg)  08/24/19 132 lb (59.9 kg)  02/27/19 133 lb (60.3 kg)    BP 120/80   Pulse 88   Temp 98.1 F (36.7 C) (Oral)   Ht 5' 1.5" (1.562 m)   Wt 132 lb (59.9 kg)   BMI 24.54 kg/m   Assessment and Plan: Patient's previous encounter reviewed. 1. Acute non-recurrent maxillary sinusitis Acute.  Persistent.  Stable.  Patient does have underlying COPD in which she is encountering with Symbicort but she is about to run out and albuterol 1 puff on a as needed basis.  I have encouraged her to take a sample of Breo 1 puff in the morning and 1 puff every 12 hours during the day of the albuterol with chamber.  Exam and history is consistent with sinus infection and we will treat with amoxicillin 500 mg 3 times a day for 10 days.

## 2019-12-21 NOTE — Telephone Encounter (Signed)
Spoke to pt- clear sputum. No fever. She is coming in

## 2019-12-21 NOTE — Telephone Encounter (Signed)
Pt. Reports she felt short of breath during the night and used her Albuterol. Has a productive cough with clear mucus and a runny nose. No fever. Virtual visit made.  Reason for Disposition . [1] Continuous (nonstop) coughing AND [2] keeps from working or sleeping  Answer Assessment - Initial Assessment Questions 1. RESPIRATORY STATUS: "Describe your breathing?" (e.g., wheezing, shortness of breath, unable to speak, severe coughing)      Shortness of breath 2. ONSET: "When did this breathing problem begin?"      Middle of the night 3. PATTERN "Does the difficult breathing come and go, or has it been constant since it started?"      Comes and goes 4. SEVERITY: "How bad is your breathing?" (e.g., mild, moderate, severe)    - MILD: No SOB at rest, mild SOB with walking, speaks normally in sentences, can lay down, no retractions, pulse < 100.    - MODERATE: SOB at rest, SOB with minimal exertion and prefers to sit, cannot lie down flat, speaks in phrases, mild retractions, audible wheezing, pulse 100-120.    - SEVERE: Very SOB at rest, speaks in single words, struggling to breathe, sitting hunched forward, retractions, pulse > 120      Mild 5. RECURRENT SYMPTOM: "Have you had difficulty breathing before?" If Yes, ask: "When was the last time?" and "What happened that time?"      Yes 6. CARDIAC HISTORY: "Do you have any history of heart disease?" (e.g., heart attack, angina, bypass surgery, angioplasty)      No 7. LUNG HISTORY: "Do you have any history of lung disease?"  (e.g., pulmonary embolus, asthma, emphysema)     No 8. CAUSE: "What do you think is causing the breathing problem?"      Unsure 9. OTHER SYMPTOMS: "Do you have any other symptoms? (e.g., dizziness, runny nose, cough, chest pain, fever)     Runny nose and cough 10. PREGNANCY: "Is there any chance you are pregnant?" "When was your last menstrual period?"       No 11. TRAVEL: "Have you traveled out of the country in the last  month?" (e.g., travel history, exposures)       No  Protocols used: BREATHING DIFFICULTY-A-AH

## 2020-01-09 ENCOUNTER — Ambulatory Visit: Payer: Self-pay | Admitting: *Deleted

## 2020-01-09 NOTE — Telephone Encounter (Signed)
Noted.  Pt is going to [facility] ED.  KP

## 2020-01-09 NOTE — Telephone Encounter (Signed)
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   This morning her right eye is very red and swollen and painful.   It's watering bad too.   Her vision is blurry.  No accidents or eye illnesses. She was treated for nasal congestion about 2 weeks ago with antibiotics and is better now.  I have referred her to the ED per the protocol.   She's going to have her daughter drive her to [facility] ED.  She was agreeable to the treatment plan and going to the ED.  I have forwarded my notes to Dr. Elizabeth Sauer at [facility] for her information.    Reason for Disposition . Patient sounds very sick or weak to the triager    Sharp pains in right temple and eye very red and painful.  Answer Assessment - Initial Assessment Questions 1. ONSET: "When did the pain start?" (e.g., minutes, hours, days)     Right eye is red.   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   I started with sharp pains in my right temple.   Something in my head went to my eye and I looked in the mirror and it's red.   I left work and laid down. The pressure is bad on my eye.   I'm still having sharp pains in my head.  My vision is affected. 2. TIMING: "Does the pain come and go, or has it been constant since it started?" (e.g., constant, intermittent, fleeting)     The pain is ok when I'm laying down.   Moving around makes it hurt worse. 3. SEVERITY: "How bad is the pain?"   (Scale 1-10; mild, moderate or severe)   - MILD (1-3): doesn't interfere with normal activities    - MODERATE (4-7): interferes with normal activities or awakens from sleep    - SEVERE (8-10): excruciating pain and patient unable to do normal activities     5 on scale.   It's not throbbing. 4. LOCATION: "Where does it hurt?"  (e.g., eyelid, eye, cheekbone)     My right eye is red.  My eyelid is swollen. 5. CAUSE: "What do you think is causing the pain?"      I don't know.     6. VISION: "Do you have blurred vision or changes in your vision?"      Yes blurry 7. EYE DISCHARGE: "Is there any discharge (pus) from the eye(s)?"  If yes, ask: "What color is it?"      It's been watering.  I'm crying. 8. FEVER: "Do you have a fever?" If Yes, ask: "What is it, how was it measured, and when did it start?"      No 9. OTHER SYMPTOMS: "Do you have any other symptoms?" (e.g., headache, nasal discharge, facial rash)     I saw Dr. Yetta Barre 2 weeks for nasal congestion and was given antibiotics.   I'm better. 10. PREGNANCY: "Is there any chance you are pregnant?" "When was your last menstrual period?"       N/A  Protocols used: EYE PAIN-A-AH

## 2020-01-17 ENCOUNTER — Telehealth: Payer: Self-pay

## 2020-01-17 NOTE — Telephone Encounter (Signed)
Please call pt and tell her with the labs that we reviewed- it is best she stay with their referral

## 2020-01-17 NOTE — Telephone Encounter (Signed)
Copied from CRM [id number]. Topic: General - Inquiry >> Jan 17, 2020 10:20 AM Daphine Deutscher D wrote: Reason for CRM: pt went to [facility] last week for pain in her head that was coming from her eyes.  They referred to to a specialist and then because of her labwork They want her to go see a rheumatologist.  They referred her out of network.  Pt is asking if Dr. Yetta Barre can refer to to someone in network.  CB# [phone]

## 2020-01-28 ENCOUNTER — Ambulatory Visit (INDEPENDENT_AMBULATORY_CARE_PROVIDER_SITE_OTHER): Payer: BC Managed Care – PPO | Admitting: Family Medicine

## 2020-01-28 ENCOUNTER — Encounter: Payer: Self-pay | Admitting: Family Medicine

## 2020-01-28 VITALS — Temp 98.2°F | Wt 131.0 lb

## 2020-01-28 DIAGNOSIS — J449 Chronic obstructive pulmonary disease, unspecified: Secondary | ICD-10-CM | POA: Diagnosis not present

## 2020-01-28 DIAGNOSIS — R059 Cough, unspecified: Secondary | ICD-10-CM

## 2020-01-28 DIAGNOSIS — J0100 Acute maxillary sinusitis, unspecified: Secondary | ICD-10-CM

## 2020-01-28 MED ORDER — AZITHROMYCIN 250 MG PO TABS: ORAL_TABLET | ORAL | 0 refills | Status: DC

## 2020-01-28 MED ORDER — GUAIFENESIN-CODEINE 100-10 MG/5ML PO SYRP: 5.0000 mL | ORAL_SOLUTION | Freq: Four times a day (QID) | ORAL | 0 refills | Status: DC | PRN

## 2020-01-28 MED ORDER — PREDNISONE 10 MG PO TABS: 10.0000 mg | ORAL_TABLET | Freq: Every day | ORAL | 0 refills | Status: DC

## 2020-01-28 NOTE — Progress Notes (Addendum)
Date:  01/28/2020   Name:  Samantha Irwin [facility]   DOB:  03-06-1960   MRN:  [id number]   Chief Complaint: Sinusitis (Cough- clear production, sounds tight. No fever, neg for Covid on Sat.)  I connected withthis patient, Samantha Irwin, by telephoneat the patient's home.  I verified that I am speaking with the correct person using two identifiers. This visit was conducted via telephone due to the Covid-19 outbreak from my office at [facility] in [place], [place]. I discussed the limitations, risks, security and privacy concerns of performing an evaluation and management service by telephone. I also discussed with the patient that there may be a patient responsible charge related to this service. The patient expressed understanding and agreed to proceed.  Sinusitis This is a new problem. The current episode started in the past 7 days (saturday). The problem has been gradually worsening since onset. There has been no fever. She is experiencing no pain. Associated symptoms include congestion, coughing, diaphoresis, shortness of breath, sinus pressure and sneezing. Pertinent negatives include no chills, ear pain, headaches, hoarse voice, neck pain, sore throat or swollen glands. Past treatments include acetaminophen and oral decongestants.    Lab Results  Component Value Date   CREATININE 1.02 (H) 02/27/2019   BUN 14 02/27/2019   NA 142 02/27/2019   K 4.5 02/27/2019   CL 102 02/27/2019   CO2 25 02/27/2019   Lab Results  Component Value Date   CHOL 259 (H) 02/27/2019   HDL 78 02/27/2019   LDLCALC 153 (H) 02/27/2019   TRIG 157 (H) 02/27/2019   CHOLHDL 3.2 05/30/2018   No results found for: TSH Lab Results  Component Value Date   HGBA1C 5.2 09/13/2016   Lab Results  Component Value Date   WBC 5.4 01/26/2018   HGB 13.5 01/26/2018   HCT 43.4 01/26/2018   MCV 94.1 01/26/2018   PLT 295 01/26/2018   No results found for: ALT, AST, GGT, ALKPHOS, BILITOT   Review of Systems   Constitutional: Positive for diaphoresis. Negative for chills, fatigue, fever and unexpected weight change.  HENT: Positive for congestion, sinus pressure and sneezing. Negative for ear discharge, ear pain, hoarse voice, rhinorrhea and sore throat.   Eyes: Negative for double vision, photophobia, pain, discharge, redness and itching.  Respiratory: Positive for cough and shortness of breath. Negative for wheezing and stridor.   Gastrointestinal: Negative for abdominal pain, blood in stool, constipation, diarrhea, nausea and vomiting.  Endocrine: Negative for cold intolerance, heat intolerance, polydipsia, polyphagia and polyuria.  Genitourinary: Negative for dysuria, flank pain, frequency, hematuria, menstrual problem, pelvic pain, urgency, vaginal bleeding and vaginal discharge.  Musculoskeletal: Negative for arthralgias, back pain, myalgias and neck pain.  Skin: Negative for rash.  Allergic/Immunologic: Negative for environmental allergies and food allergies.  Neurological: Negative for dizziness, weakness, light-headedness, numbness and headaches.  Hematological: Negative for adenopathy. Does not bruise/bleed easily.  Psychiatric/Behavioral: Negative for dysphoric mood. The patient is not nervous/anxious.     Patient Active Problem List   Diagnosis Date Noted  . Overactive bladder 05/30/2018  . Chronic obstructive pulmonary disease (HCC) 05/30/2018  . Familial hypercholesterolemia 05/30/2018  . Flexural eczema 05/30/2018  . Cigarette nicotine dependence without complication 05/30/2018  . Atherosclerosis of aorta (HCC) 05/12/2017  . Screening for colon cancer   . Essential hypertension 06/12/2015  . Annual physical exam 08/05/2014    No Known Allergies  Past Surgical History:  Procedure Laterality Date  . CESAREAN SECTION    .  COLONOSCOPY  2012   cleared for 5 yrs- [place]  . COLONOSCOPY WITH PROPOFOL N/A 11/02/2016   Procedure: COLONOSCOPY WITH PROPOFOL;  Surgeon: Toney Reil, MD;  Location: [facility];  Service: Gastroenterology;  Laterality: N/A;    Social History   Tobacco Use  . Smoking status: Former Smoker    Packs/day: 0.00    Types: Cigarettes  . Smokeless tobacco: Never Used  . Tobacco comment: gave info on patches and pills  Vaping Use  . Vaping Use: Never used  Substance Use Topics  . Alcohol use: Yes    Alcohol/week: 3.0 standard drinks    Types: 3 Glasses of wine per week  . Drug use: No     Medication list has been reviewed and updated.  Current Meds  Medication Sig  . albuterol (VENTOLIN HFA) 108 (90 Base) MCG/ACT inhaler Inhale 2 puffs into the lungs every 6 (six) hours as needed for wheezing or shortness of breath.  . B Complex Vitamins (B COMPLEX PO) Take by mouth daily.  . Biotin 10 MG TABS Take 1 tablet by mouth daily.  . carbamide peroxide (DEBROX) 6.5 % OTIC solution Place 5 drops into both ears 2 (two) times daily.  . cholecalciferol (VITAMIN D) 1000 UNITS tablet Take 1,000 Units by mouth daily.  . hydrochlorothiazide (HYDRODIURIL) 12.5 MG tablet Take 1 tablet (12.5 mg total) by mouth daily.  . metoprolol succinate (TOPROL-XL) 25 MG 24 hr tablet Take 1 tablet (25 mg total) by mouth daily.  . Multiple Vitamins-Minerals (MULTIVITAMIN WOMEN 50+ PO) Take 1 tablet by mouth daily.  . Omega-3 Fatty Acids (FISH OIL) 1000 MG CAPS Take 1 capsule (1,000 mg total) by mouth daily.  Marland Kitchen oxybutynin (DITROPAN-XL) 5 MG 24 hr tablet Take 1 tablet (5 mg total) by mouth at bedtime.  . triamcinolone ointment (KENALOG) 0.5 % Apply 1 application topically 2 (two) times daily.  . vitamin C (ASCORBIC ACID) 250 MG tablet Take 250 mg by mouth daily.   Current Facility-Administered Medications for the 01/28/20 encounter (Office Visit) with Duanne Limerick, MD  Medication  . ipratropium-albuterol (DUONEB) 0.5-2.5 (3) MG/3ML nebulizer solution 3 mL    PHQ 2/9 Scores 01/28/2020 08/24/2019 02/27/2019 11/10/2018  PHQ - 2 Score 0 0 0 0  PHQ- 9  Score 0 0 0 1    GAD 7 : Generalized Anxiety Score 01/28/2020 08/24/2019 02/27/2019 10/06/2018  Nervous, Anxious, on Edge 0 0 0 1  Control/stop worrying 0 0 0 3  Worry too much - different things 0 0 0 3  Trouble relaxing 0 0 0 1  Restless 0 0 0 1  Easily annoyed or irritable 0 0 0 0  Afraid - awful might happen 0 0 0 0  Total GAD 7 Score 0 0 0 9  Anxiety Difficulty - - - Not difficult at all    BP Readings from Last 3 Encounters:  12/21/19 120/80  08/24/19 130/78  02/27/19 122/62    Physical Exam Vitals and nursing note reviewed.  HENT:     Nose: Nose normal.  Pulmonary:     Breath sounds: Wheezing present.     Wt Readings from Last 3 Encounters:  01/28/20 131 lb (59.4 kg)  12/21/19 132 lb (59.9 kg)  08/24/19 132 lb (59.9 kg)    Temp 98.2 F (36.8 C) (Oral)   Wt 131 lb (59.4 kg)   BMI 24.35 kg/m   Assessment and Plan:  Over the weekend patient was seen at [facility] and was tested for Covid which was noted to be negative.  1. Acute non-recurrent maxillary sinusitis New onset.  Persistent.  Gradually improving but still baseline concerning.  Will initiate a azithromycin since the cough is nonproductive to 50 mg 2 today followed by 1 a day for 4 days. - azithromycin (ZITHROMAX) 250 MG tablet; 2 tablets then 1 a day for 4 day.  Dispense: 6 tablet; Refill: 0  2. Chronic obstructive pulmonary disease, unspecified COPD type (HCC) Patient has baseline COPD for which she is taking her inhaler albuterol 2 puffs every 6 hours.  We will also start prednisone 20 mg for 3 days and 1 a day for 10 days thereafter. - predniSONE (DELTASONE) 10 MG tablet; Take 1 tablet (10 mg total) by mouth daily with breakfast.  Dispense: 30 tablet; Refill: 0  3. Cough Patient has persistent cough which is affected her sleep pattern.  We will initiate Robitussin-AC 1 teaspoon every 6 hours as needed cough. - guaiFENesin-codeine (ROBITUSSIN AC)  100-10 MG/5ML syrup; Take 5 mLs by mouth 4 (four) times daily as needed for cough.  Dispense: 118 mL; Refill: 0  I spent 12 minutes with this patient, More than 50% of that time was spent in face to face education, counseling and care coordination.

## 2020-02-04 ENCOUNTER — Ambulatory Visit
Admission: RE | Admit: 2020-02-04 | Discharge: 2020-02-04 | Disposition: A | Discharge status: Home / Self Care | Payer: BC Managed Care – PPO | Attending: Family Medicine | Admitting: Family Medicine

## 2020-02-04 ENCOUNTER — Other Ambulatory Visit: Payer: Self-pay

## 2020-02-04 ENCOUNTER — Ambulatory Visit
Admission: RE | Admit: 2020-02-04 | Discharge: 2020-02-04 | Disposition: A | Discharge status: Home / Self Care | Payer: BC Managed Care – PPO | Source: Ambulatory Visit | Attending: Family Medicine | Admitting: Family Medicine

## 2020-02-04 ENCOUNTER — Ambulatory Visit: Payer: BC Managed Care – PPO | Admitting: Family Medicine

## 2020-02-04 ENCOUNTER — Encounter: Payer: Self-pay | Admitting: Family Medicine

## 2020-02-04 VITALS — BP 124/70 | HR 78 | Temp 98.1°F | Ht 61.5 in | Wt 129.0 lb

## 2020-02-04 DIAGNOSIS — J449 Chronic obstructive pulmonary disease, unspecified: Secondary | ICD-10-CM | POA: Insufficient documentation

## 2020-02-04 DIAGNOSIS — R053 Chronic cough: Secondary | ICD-10-CM | POA: Insufficient documentation

## 2020-02-04 DIAGNOSIS — H6123 Impacted cerumen, bilateral: Secondary | ICD-10-CM

## 2020-02-04 MED ORDER — FLUTICASONE FUROATE-VILANTEROL 100-25 MCG/INH IN AEPB: 1.0000 | INHALATION_SPRAY | Freq: Every morning | RESPIRATORY_TRACT | 5 refills | Status: AC

## 2020-02-04 NOTE — Progress Notes (Signed)
Date:  02/04/2020   Name:  Samantha Irwin [facility]   DOB:  [birth year]   MRN:  [id number]   Chief Complaint: Sinusitis (Follow up. Better but still coughing up mucous. No color to mucuos. Cough is worse at night. Wheezing some. Using inhaler to help with wheezing. )  Sinusitis This is a chronic problem. The current episode started more than 1 year ago. The problem has been gradually improving since onset. There has been no fever. She is experiencing no pain. Associated symptoms include congestion and coughing. Pertinent negatives include no chills, diaphoresis, ear pain, headaches, hoarse voice, neck pain, shortness of breath, sinus pressure, sneezing, sore throat or swollen glands.    Lab Results  Component Value Date   CREATININE 1.02 (H) 02/27/2019   BUN 14 02/27/2019   NA 142 02/27/2019   K 4.5 02/27/2019   CL 102 02/27/2019   CO2 25 02/27/2019   Lab Results  Component Value Date   CHOL 259 (H) 02/27/2019   HDL 78 02/27/2019   LDLCALC 153 (H) 02/27/2019   TRIG 157 (H) 02/27/2019   CHOLHDL 3.2 05/30/2018   No results found for: TSH Lab Results  Component Value Date   HGBA1C 5.2 09/13/2016   Lab Results  Component Value Date   WBC 5.4 01/26/2018   HGB 13.5 01/26/2018   HCT 43.4 01/26/2018   MCV 94.1 01/26/2018   PLT 295 01/26/2018   No results found for: ALT, AST, GGT, ALKPHOS, BILITOT   Review of Systems  Constitutional: Negative.  Negative for chills, diaphoresis, fatigue, fever and unexpected weight change.  HENT: Positive for congestion. Negative for ear discharge, ear pain, hoarse voice, rhinorrhea, sinus pressure, sneezing and sore throat.   Eyes: Negative for double vision, photophobia, pain, discharge, redness and itching.  Respiratory: Positive for cough. Negative for shortness of breath, wheezing and stridor.   Gastrointestinal: Negative for abdominal pain, blood in stool, constipation, diarrhea, nausea and vomiting.  Endocrine: Negative for cold  intolerance, heat intolerance, polydipsia, polyphagia and polyuria.  Genitourinary: Negative for dysuria, flank pain, frequency, hematuria, menstrual problem, pelvic pain, urgency, vaginal bleeding and vaginal discharge.  Musculoskeletal: Negative for arthralgias, back pain, myalgias and neck pain.  Skin: Negative for rash.  Allergic/Immunologic: Negative for environmental allergies and food allergies.  Neurological: Negative for dizziness, weakness, light-headedness, numbness and headaches.  Hematological: Negative for adenopathy. Does not bruise/bleed easily.  Psychiatric/Behavioral: Negative for dysphoric mood. The patient is not nervous/anxious.     Patient Active Problem List   Diagnosis Date Noted  . Overactive bladder 05/30/2018  . Chronic obstructive pulmonary disease (HCC) 05/30/2018  . Familial hypercholesterolemia 05/30/2018  . Flexural eczema 05/30/2018  . Cigarette nicotine dependence without complication 05/30/2018  . Atherosclerosis of aorta (HCC) 05/12/2017  . Screening for colon cancer   . Essential hypertension 06/12/2015  . Annual physical exam 08/05/2014    No Known Allergies  Past Surgical History:  Procedure Laterality Date  . CESAREAN SECTION    . COLONOSCOPY  2012   cleared for 5 yrs- [place]  . COLONOSCOPY WITH PROPOFOL N/A 11/02/2016   Procedure: COLONOSCOPY WITH PROPOFOL;  Surgeon: Toney Reil, MD;  Location: [facility];  Service: Gastroenterology;  Laterality: N/A;    Social History   Tobacco Use  . Smoking status: Former Smoker    Packs/day: 0.00    Types: Cigarettes  . Smokeless tobacco: Never Used  . Tobacco comment: gave info on patches and pills  Vaping Use  . Vaping  Use: Never used  Substance Use Topics  . Alcohol use: Yes    Alcohol/week: 3.0 standard drinks    Types: 3 Glasses of wine per week  . Drug use: No     Medication list has been reviewed and updated.  Current Meds  Medication Sig  . albuterol (VENTOLIN  HFA) 108 (90 Base) MCG/ACT inhaler Inhale 2 puffs into the lungs every 6 (six) hours as needed for wheezing or shortness of breath.  . B Complex Vitamins (B COMPLEX PO) Take by mouth daily.  . Biotin 10 MG TABS Take 1 tablet by mouth daily.  . carbamide peroxide (DEBROX) 6.5 % OTIC solution Place 5 drops into both ears 2 (two) times daily.  . cholecalciferol (VITAMIN D) 1000 UNITS tablet Take 1,000 Units by mouth daily.  Marland Kitchen etodolac (LODINE) 200 MG capsule Take 1 capsule (200 mg total) by mouth 2 (two) times daily.  Marland Kitchen guaiFENesin-codeine (ROBITUSSIN AC) 100-10 MG/5ML syrup Take 5 mLs by mouth 4 (four) times daily as needed for cough.  . hydrochlorothiazide (HYDRODIURIL) 12.5 MG tablet Take 1 tablet (12.5 mg total) by mouth daily.  . metoprolol succinate (TOPROL-XL) 25 MG 24 hr tablet Take 1 tablet (25 mg total) by mouth daily.  . Multiple Vitamins-Minerals (MULTIVITAMIN WOMEN 50+ PO) Take 1 tablet by mouth daily.  . Omega-3 Fatty Acids (FISH OIL) 1000 MG CAPS Take 1 capsule (1,000 mg total) by mouth daily.  Marland Kitchen oxybutynin (DITROPAN-XL) 5 MG 24 hr tablet Take 1 tablet (5 mg total) by mouth at bedtime.  . predniSONE (DELTASONE) 10 MG tablet Take 1 tablet (10 mg total) by mouth daily with breakfast.  . triamcinolone ointment (KENALOG) 0.5 % Apply 1 application topically 2 (two) times daily.  . vitamin C (ASCORBIC ACID) 250 MG tablet Take 250 mg by mouth daily.   Current Facility-Administered Medications for the 02/04/20 encounter (Office Visit) with Duanne Limerick, MD  Medication  . ipratropium-albuterol (DUONEB) 0.5-2.5 (3) MG/3ML nebulizer solution 3 mL    PHQ 2/9 Scores 02/04/2020 01/28/2020 08/24/2019 02/27/2019  PHQ - 2 Score 0 0 0 0  PHQ- 9 Score 0 0 0 0    GAD 7 : Generalized Anxiety Score 02/04/2020 01/28/2020 08/24/2019 02/27/2019  Nervous, Anxious, on Edge 0 0 0 0  Control/stop worrying 0 0 0 0  Worry too much - different things 0 0 0 0  Trouble relaxing 0 0 0 0  Restless 0 0 0 0   Easily annoyed or irritable 0 0 0 0  Afraid - awful might happen 0 0 0 0  Total GAD 7 Score 0 0 0 0  Anxiety Difficulty Not difficult at all - - -    BP Readings from Last 3 Encounters:  02/04/20 (!) 142/70  12/21/19 120/80  08/24/19 130/78    Physical Exam Vitals and nursing note reviewed.  Constitutional:      Appearance: She is well-developed and well-nourished.  HENT:     Head: Normocephalic.     Right Ear: Tympanic membrane, ear canal and external ear normal. There is impacted cerumen.     Left Ear: Tympanic membrane, ear canal and external ear normal. There is impacted cerumen.     Mouth/Throat:     Mouth: Oropharynx is clear and moist.  Eyes:     General: Lids are everted, no foreign bodies appreciated. No scleral icterus.       Left eye: No foreign body or hordeolum.     Extraocular Movements: EOM normal.  Conjunctiva/sclera: Conjunctivae normal.     Right eye: Right conjunctiva is not injected.     Left eye: Left conjunctiva is not injected.     Pupils: Pupils are equal, round, and reactive to light.  Neck:     Thyroid: No thyromegaly.     Vascular: No JVD.     Trachea: No tracheal deviation.  Cardiovascular:     Rate and Rhythm: Normal rate and regular rhythm.     Pulses: Intact distal pulses.     Heart sounds: Normal heart sounds. No murmur heard. No friction rub. No gallop.   Pulmonary:     Effort: Pulmonary effort is normal. No respiratory distress.     Breath sounds: Normal breath sounds. No wheezing, rhonchi or rales.  Abdominal:     General: Bowel sounds are normal.     Palpations: Abdomen is soft. There is no hepatosplenomegaly or mass.     Tenderness: There is no abdominal tenderness. There is no guarding or rebound.  Musculoskeletal:        General: No tenderness or edema. Normal range of motion.     Cervical back: Normal range of motion and neck supple.  Lymphadenopathy:     Cervical: No cervical adenopathy.  Skin:    General: Skin is warm.      Findings: No rash.  Neurological:     Mental Status: She is alert and oriented to person, place, and time.     Cranial Nerves: No cranial nerve deficit.     Deep Tendon Reflexes: Strength normal. Reflexes normal.  Psychiatric:        Mood and Affect: Mood and affect normal. Mood is not anxious or depressed.     Wt Readings from Last 3 Encounters:  02/04/20 129 lb (58.5 kg)  01/28/20 131 lb (59.4 kg)  12/21/19 132 lb (59.9 kg)    BP (!) 142/70   Pulse 78   Temp 98.1 F (36.7 C) (Oral)   Ht 5' 1.5" (1.562 m)   Wt 129 lb (58.5 kg)   SpO2 100%   BMI 23.98 kg/m   Assessment and Plan:  1. Chronic cough Chronic. Persistent. Patient has improved but is still having a cough however she is only taking her beta agonist inhaler on an as-needed basis. Given the circumstances that she usually has this cough during this time year I have encouraged her to use it on a regular basis. - DG Chest 2 View; Future  2. Chronic obstructive pulmonary disease, unspecified COPD type (HCC) Chronic. Uncontrolled. Stable. We will add Breo inhaler 100-25 1 puff in lungs every morning with rescue inhaler to be used on a regular basis during this time year until cough has subsided. We will obtain a chest x-ray at this time to see if there is any acute process causing any cough or difficulty with her COPD. - DG Chest 2 View; Future - fluticasone furoate-vilanterol (BREO ELLIPTA) 100-25 MCG/INH AEPB; Inhale 1 puff into the lungs in the morning.  Dispense: 28 each; Refill: 5  3. Bilateral impacted cerumen Chronic. Bilateral. Persistent. We will refer to ear nose and throat for removal. In the meantime patient will continue to use her Debrox with bulb syringe irrigation daily. - Ambulatory referral to ENT

## 2020-02-18 ENCOUNTER — Ambulatory Visit: Payer: Self-pay | Admitting: Family Medicine

## 2020-03-03 ENCOUNTER — Telehealth: Payer: Self-pay

## 2020-03-03 NOTE — Telephone Encounter (Signed)
Copied from CRM [id number]. Topic: General - Other >> Mar 03, 2020 11:41 AM [name] wrote: Reason for CRM: Pt stated she tested positive for Covid and the school nurse advised her to speak with her doctor. Pt has some questions and concerns regarding the positive test and requests call back.

## 2020-03-03 NOTE — Telephone Encounter (Signed)
Back pain- take Tylenol for pain/ common with COVID

## 2020-03-17 ENCOUNTER — Encounter: Payer: Self-pay | Admitting: Family Medicine

## 2020-03-19 ENCOUNTER — Encounter: Payer: Self-pay | Admitting: Family Medicine

## 2020-03-19 ENCOUNTER — Other Ambulatory Visit: Payer: Self-pay

## 2020-03-19 ENCOUNTER — Ambulatory Visit (INDEPENDENT_AMBULATORY_CARE_PROVIDER_SITE_OTHER): Payer: BC Managed Care – PPO | Admitting: Family Medicine

## 2020-03-19 ENCOUNTER — Other Ambulatory Visit (HOSPITAL_COMMUNITY)
Admission: RE | Admit: 2020-03-19 | Discharge: 2020-03-19 | Disposition: A | Discharge status: Home / Self Care | Payer: Self-pay | Source: Ambulatory Visit | Attending: Family Medicine | Admitting: Family Medicine

## 2020-03-19 VITALS — BP 120/80 | HR 68 | Ht 61.5 in | Wt 129.0 lb

## 2020-03-19 DIAGNOSIS — Z124 Encounter for screening for malignant neoplasm of cervix: Secondary | ICD-10-CM | POA: Insufficient documentation

## 2020-03-19 DIAGNOSIS — Z0000 Encounter for general adult medical examination without abnormal findings: Secondary | ICD-10-CM | POA: Diagnosis not present

## 2020-03-19 DIAGNOSIS — Z1231 Encounter for screening mammogram for malignant neoplasm of breast: Secondary | ICD-10-CM

## 2020-03-19 DIAGNOSIS — S8001XA Contusion of right knee, initial encounter: Secondary | ICD-10-CM | POA: Diagnosis not present

## 2020-03-19 DIAGNOSIS — H6123 Impacted cerumen, bilateral: Secondary | ICD-10-CM

## 2020-03-19 MED ORDER — DICLOFENAC SODIUM 1 % EX GEL: 2.0000 g | Freq: Four times a day (QID) | CUTANEOUS | 0 refills | Status: DC

## 2020-03-19 NOTE — Progress Notes (Signed)
Date:  03/19/2020   Name:  Samantha Irwin   DOB:  11-24-1960   MRN:  [id number]   Chief Complaint: Annual Exam and Knee Pain (X 6 months- hit on a coffee table- wakes her up at night with the pain)  Patient is a 61 year old female who presents for a comprehensive physical exam. The patient reports the following problems: knee. Health maintenance has been reviewed cervical pap  Knee Pain  The incident occurred more than 1 week ago. The incident occurred at home. The injury mechanism was a direct blow (coffee table). The pain is present in the right knee. The quality of the pain is described as aching. The pain is at a severity of 5/10. The pain is moderate. Pertinent negatives include no inability to bear weight, loss of motion, loss of sensation, muscle weakness, numbness or tingling. She has tried acetaminophen for the symptoms. The treatment provided mild relief.    Lab Results  Component Value Date   CREATININE 1.02 (H) 02/27/2019   BUN 14 02/27/2019   NA 142 02/27/2019   K 4.5 02/27/2019   CL 102 02/27/2019   CO2 25 02/27/2019   Lab Results  Component Value Date   CHOL 259 (H) 02/27/2019   HDL 78 02/27/2019   LDLCALC 153 (H) 02/27/2019   TRIG 157 (H) 02/27/2019   CHOLHDL 3.2 05/30/2018   No results found for: TSH Lab Results  Component Value Date   HGBA1C 5.2 09/13/2016   Lab Results  Component Value Date   WBC 5.4 01/26/2018   HGB 13.5 01/26/2018   HCT 43.4 01/26/2018   MCV 94.1 01/26/2018   PLT 295 01/26/2018   No results found for: ALT, AST, GGT, ALKPHOS, BILITOT   Review of Systems  Constitutional: Negative.  Negative for chills, fatigue, fever and unexpected weight change.  HENT: Negative for congestion, ear discharge, ear pain, rhinorrhea, sinus pressure, sneezing and sore throat.   Eyes: Negative for double vision, photophobia, pain, discharge, redness and itching.  Respiratory: Negative for cough, shortness of breath, wheezing and stridor.    Gastrointestinal: Negative for abdominal pain, blood in stool, constipation, diarrhea, nausea and vomiting.  Endocrine: Negative for cold intolerance, heat intolerance, polydipsia, polyphagia and polyuria.  Genitourinary: Negative for dysuria, flank pain, frequency, hematuria, menstrual problem, pelvic pain, urgency, vaginal bleeding and vaginal discharge.  Musculoskeletal: Negative for arthralgias, back pain and myalgias.  Skin: Negative for rash.  Allergic/Immunologic: Negative for environmental allergies and food allergies.  Neurological: Negative for dizziness, tingling, weakness, light-headedness, numbness and headaches.  Hematological: Negative for adenopathy. Does not bruise/bleed easily.  Psychiatric/Behavioral: Negative for dysphoric mood. The patient is not nervous/anxious.     Patient Active Problem List   Diagnosis Date Noted  . Overactive bladder 05/30/2018  . Chronic obstructive pulmonary disease (HCC) 05/30/2018  . Familial hypercholesterolemia 05/30/2018  . Flexural eczema 05/30/2018  . Cigarette nicotine dependence without complication 05/30/2018  . Atherosclerosis of aorta (HCC) 05/12/2017  . Screening for colon cancer   . Essential hypertension 06/12/2015  . Annual physical exam 08/05/2014    No Known Allergies  Past Surgical History:  Procedure Laterality Date  . CESAREAN SECTION    . COLONOSCOPY  2012   cleared for 5 yrs- [place]  . COLONOSCOPY WITH PROPOFOL N/A 11/02/2016   Procedure: COLONOSCOPY WITH PROPOFOL;  Surgeon: Toney Reil, MD;  Location: [facility];  Service: Gastroenterology;  Laterality: N/A;    Social History   Tobacco Use  .  Smoking status: Current Every Day Smoker    Packs/day: 0.00    Types: Cigarettes  . Smokeless tobacco: Never Used  . Tobacco comment: gave info on patches and pills  Vaping Use  . Vaping Use: Never used  Substance Use Topics  . Alcohol use: Yes    Alcohol/week: 3.0 standard drinks    Types: 3  Glasses of wine per week  . Drug use: No     Medication list has been reviewed and updated.  Current Meds  Medication Sig  . albuterol (VENTOLIN HFA) 108 (90 Base) MCG/ACT inhaler Inhale 2 puffs into the lungs every 6 (six) hours as needed for wheezing or shortness of breath.  . B Complex Vitamins (B COMPLEX PO) Take by mouth daily.  . Biotin 10 MG TABS Take 1 tablet by mouth daily.  . carbamide peroxide (DEBROX) 6.5 % OTIC solution Place 5 drops into both ears 2 (two) times daily.  . cholecalciferol (VITAMIN D) 1000 UNITS tablet Take 1,000 Units by mouth daily.  Marland Kitchen etodolac (LODINE) 200 MG capsule Take 1 capsule (200 mg total) by mouth 2 (two) times daily.  . fluticasone furoate-vilanterol (BREO ELLIPTA) 100-25 MCG/INH AEPB Inhale 1 puff into the lungs in the morning.  . hydrochlorothiazide (HYDRODIURIL) 12.5 MG tablet Take 1 tablet (12.5 mg total) by mouth daily.  . metoprolol succinate (TOPROL-XL) 25 MG 24 hr tablet Take 1 tablet (25 mg total) by mouth daily.  . Multiple Vitamins-Minerals (MULTIVITAMIN WOMEN 50+ PO) Take 1 tablet by mouth daily.  . Omega-3 Fatty Acids (FISH OIL) 1000 MG CAPS Take 1 capsule (1,000 mg total) by mouth daily.  Marland Kitchen oxybutynin (DITROPAN-XL) 5 MG 24 hr tablet Take 1 tablet (5 mg total) by mouth at bedtime.  . triamcinolone ointment (KENALOG) 0.5 % Apply 1 application topically 2 (two) times daily.  . vitamin C (ASCORBIC ACID) 250 MG tablet Take 250 mg by mouth daily.   Current Facility-Administered Medications for the 03/19/20 encounter (Office Visit) with Duanne Limerick, MD  Medication  . ipratropium-albuterol (DUONEB) 0.5-2.5 (3) MG/3ML nebulizer solution 3 mL    PHQ 2/9 Scores 03/19/2020 02/04/2020 01/28/2020 08/24/2019  PHQ - 2 Score 0 0 0 0  PHQ- 9 Score 0 0 0 0    GAD 7 : Generalized Anxiety Score 03/19/2020 02/04/2020 01/28/2020 08/24/2019  Nervous, Anxious, on Edge 0 0 0 0  Control/stop worrying 0 0 0 0  Worry too much - different things 0 0 0 0   Trouble relaxing 0 0 0 0  Restless 0 0 0 0  Easily annoyed or irritable 0 0 0 0  Afraid - awful might happen 0 0 0 0  Total GAD 7 Score 0 0 0 0  Anxiety Difficulty - Not difficult at all - -    BP Readings from Last 3 Encounters:  03/19/20 120/80  02/04/20 124/70  12/21/19 120/80    Physical Exam Vitals and nursing note reviewed. Exam conducted with a chaperone present.  Constitutional:      Appearance: She is well-developed, well-groomed, normal weight and well-nourished.  HENT:     Head: Normocephalic.     Jaw: There is normal jaw occlusion.     Right Ear: Hearing and external ear normal. There is impacted cerumen.     Left Ear: Hearing and external ear normal. There is impacted cerumen.     Nose: Nose normal.     Mouth/Throat:     Lips: Pink.     Mouth: Oropharynx is  clear and moist. Mucous membranes are moist.     Tongue: No lesions.     Palate: No mass.     Pharynx: Oropharynx is clear. Uvula midline.     Tonsils: No tonsillar exudate.  Eyes:     General: Lids are normal. Lids are everted, no foreign bodies appreciated. Vision grossly intact. Gaze aligned appropriately. No scleral icterus.    Extraocular Movements: EOM normal.     Conjunctiva/sclera: Conjunctivae normal.     Right eye: Right conjunctiva is not injected.     Left eye: Left conjunctiva is not injected.     Pupils: Pupils are equal, round, and reactive to light.  Neck:     Thyroid: No thyroid mass, thyromegaly or thyroid tenderness.     Vascular: Normal carotid pulses. No carotid bruit, hepatojugular reflux or JVD.     Trachea: Trachea and phonation normal. No tracheal deviation.  Cardiovascular:     Rate and Rhythm: Normal rate and regular rhythm.     Chest Wall: PMI is not displaced. No thrill.     Pulses: Normal pulses and intact distal pulses.          Carotid pulses are 2+ on the right side and 2+ on the left side.      Radial pulses are 2+ on the right side and 2+ on the left side.        Femoral pulses are 2+ on the right side and 2+ on the left side.      Popliteal pulses are 2+ on the right side and 2+ on the left side.       Dorsalis pedis pulses are 2+ on the right side and 2+ on the left side.       Posterior tibial pulses are 2+ on the right side and 2+ on the left side.     Heart sounds: Normal heart sounds, S1 normal and S2 normal. No murmur heard.  No systolic murmur is present.  No diastolic murmur is present. No friction rub. No gallop. No S3 or S4 sounds.   Pulmonary:     Effort: Pulmonary effort is normal. No respiratory distress.     Breath sounds: Normal breath sounds. No decreased air movement. No decreased breath sounds, wheezing, rhonchi or rales.  Chest:     Chest wall: No mass.  Breasts:     Right: Normal. No swelling, bleeding, inverted nipple, mass, nipple discharge, skin change, tenderness, axillary adenopathy or supraclavicular adenopathy.     Left: Normal. No swelling, bleeding, inverted nipple, mass, nipple discharge, skin change, tenderness, axillary adenopathy or supraclavicular adenopathy.    Abdominal:     General: Bowel sounds are normal.     Palpations: Abdomen is soft. There is no hepatomegaly, splenomegaly, hepatosplenomegaly or mass.     Tenderness: There is no abdominal tenderness. There is no right CVA tenderness, left CVA tenderness, guarding or rebound.     Hernia: There is no hernia in the left inguinal area or right inguinal area.  Genitourinary:    Exam position: Lithotomy position.     Labia:        Right: No rash, tenderness or lesion.        Left: No rash, tenderness or lesion.      Vagina: Normal.     Cervix: Normal.     Uterus: Normal.      Adnexa: Right adnexa normal and left adnexa normal.       Right: No mass, tenderness or fullness.  Left: No mass, tenderness or fullness.       Rectum: Normal. Guaiac result negative. No mass.  Musculoskeletal:        General: No tenderness or edema. Normal range of motion.      Cervical back: Full passive range of motion without pain, normal range of motion and neck supple.     Right knee: Bony tenderness present.     Right lower leg: No edema.     Left lower leg: No edema.       Legs:  Lymphadenopathy:     Head:     Right side of head: No submandibular or tonsillar adenopathy.     Left side of head: No submandibular or tonsillar adenopathy.     Cervical: No cervical adenopathy.     Right cervical: No superficial, deep or posterior cervical adenopathy.    Left cervical: No superficial, deep or posterior cervical adenopathy.     Upper Body:     Right upper body: No supraclavicular, axillary or pectoral adenopathy.     Left upper body: No supraclavicular, axillary or pectoral adenopathy.     Lower Body: No right inguinal adenopathy. No left inguinal adenopathy.  Skin:    General: Skin is warm.     Capillary Refill: Capillary refill takes less than 2 seconds.     Findings: No rash.  Neurological:     General: No focal deficit present.     Mental Status: She is alert and oriented to person, place, and time.     Cranial Nerves: Cranial nerves are intact. No cranial nerve deficit.     Sensory: Sensation is intact.     Motor: Motor function is intact.     Deep Tendon Reflexes: Strength normal. Reflexes normal.  Psychiatric:        Mood and Affect: Mood and affect normal. Mood is not anxious or depressed.        Behavior: Behavior is cooperative.     Wt Readings from Last 3 Encounters:  03/19/20 129 lb (58.5 kg)  02/04/20 129 lb (58.5 kg)  01/28/20 131 lb (59.4 kg)    BP 120/80   Pulse 68   Ht 5' 1.5" (1.562 m)   Wt 129 lb (58.5 kg)   BMI 23.98 kg/m   Assessment and Plan:

## 2020-03-20 ENCOUNTER — Other Ambulatory Visit: Payer: Self-pay

## 2020-03-20 DIAGNOSIS — E7801 Familial hypercholesterolemia: Secondary | ICD-10-CM

## 2020-03-20 LAB — LIPID PANEL WITH LDL/HDL RATIO
Cholesterol, Total: 265 mg/dL — ABNORMAL HIGH (ref 100–199)
HDL: 78 mg/dL (ref >39)
LDL Chol Calc (NIH): 170 mg/dL — ABNORMAL HIGH (ref 0–99)
LDL/HDL Ratio: 2.2 ratio (ref 0.0–3.2)
Triglycerides: 101 mg/dL (ref 0–149)
VLDL Cholesterol Cal: 17 mg/dL (ref 5–40)

## 2020-03-20 LAB — RENAL FUNCTION PANEL
Albumin: 4.5 g/dL (ref 3.8–4.8)
BUN/Creatinine Ratio: 15 (ref 12–28)
BUN: 17 mg/dL (ref 8–27)
CO2: 24 mmol/L (ref 20–29)
Calcium: 10.3 mg/dL (ref 8.7–10.3)
Chloride: 101 mmol/L (ref 96–106)
Creatinine, Ser: 1.14 mg/dL — ABNORMAL HIGH (ref 0.57–1.00)
GFR calc Af Amer: 60 mL/min/{1.73_m2} (ref >59)
GFR calc non Af Amer: 52 mL/min/{1.73_m2} — ABNORMAL LOW (ref >59)
Glucose: 95 mg/dL (ref 65–99)
Phosphorus: 4.1 mg/dL (ref 3.0–4.3)
Potassium: 4.9 mmol/L (ref 3.5–5.2)
Sodium: 143 mmol/L (ref 134–144)

## 2020-03-20 LAB — CYTOLOGY - PAP
Comment: NEGATIVE
Diagnosis: NEGATIVE
High risk HPV: NEGATIVE

## 2020-03-20 MED ORDER — ATORVASTATIN CALCIUM 10 MG PO TABS: 10.0000 mg | ORAL_TABLET | Freq: Every day | ORAL | 1 refills | Status: DC

## 2020-03-20 NOTE — Progress Notes (Unsigned)
Sent in atorv 

## 2020-03-31 ENCOUNTER — Ambulatory Visit: Payer: BC Managed Care – PPO

## 2020-04-04 ENCOUNTER — Other Ambulatory Visit: Payer: Self-pay | Admitting: Family Medicine

## 2020-04-04 DIAGNOSIS — I10 Essential (primary) hypertension: Secondary | ICD-10-CM

## 2020-04-05 ENCOUNTER — Other Ambulatory Visit: Payer: Self-pay | Admitting: Family Medicine

## 2020-04-05 DIAGNOSIS — I10 Essential (primary) hypertension: Secondary | ICD-10-CM

## 2020-04-05 NOTE — Telephone Encounter (Signed)
Requested Prescriptions  Pending Prescriptions Disp Refills  . hydrochlorothiazide (HYDRODIURIL) 12.5 MG tablet [Pharmacy Med Name: hydroCHLOROthiazide 12.5 MG Oral Tablet] 90 tablet 1    Sig: Take 1 tablet by mouth once daily     Cardiovascular: Diuretics - Thiazide Failed - 04/05/2020  9:08 AM      Failed - Cr in normal range and within 360 days    Creatinine, Ser  Date Value Ref Range Status  03/19/2020 1.14 (H) 0.57 - 1.00 mg/dL Final         Passed - Ca in normal range and within 360 days    Calcium  Date Value Ref Range Status  03/19/2020 10.3 8.7 - 10.3 mg/dL Final         Passed - K in normal range and within 360 days    Potassium  Date Value Ref Range Status  03/19/2020 4.9 3.5 - 5.2 mmol/L Final         Passed - Na in normal range and within 360 days    Sodium  Date Value Ref Range Status  03/19/2020 143 134 - 144 mmol/L Final         Passed - Last BP in normal range    BP Readings from Last 1 Encounters:  03/19/20 120/80         Passed - Valid encounter within last 6 months    Recent Outpatient Visits          2 weeks ago Annual physical exam   [facility] Duanne Limerick, MD   2 months ago Chronic cough   [facility] Duanne Limerick, MD   2 months ago Acute non-recurrent maxillary sinusitis   [facility] Duanne Limerick, MD   3 months ago Acute non-recurrent maxillary sinusitis   [facility] Duanne Limerick, MD   7 months ago Essential hypertension   [facility] Duanne Limerick, MD      Future Appointments            In 3 days Duanne Limerick, MD [facility], [facility]

## 2020-04-08 ENCOUNTER — Encounter: Payer: Self-pay | Admitting: Family Medicine

## 2020-04-08 ENCOUNTER — Ambulatory Visit: Payer: BC Managed Care – PPO | Admitting: Family Medicine

## 2020-04-08 ENCOUNTER — Other Ambulatory Visit: Payer: Self-pay

## 2020-04-08 ENCOUNTER — Ambulatory Visit
Admission: RE | Admit: 2020-04-08 | Discharge: 2020-04-08 | Disposition: A | Discharge status: Home / Self Care | Payer: BC Managed Care – PPO | Source: Ambulatory Visit | Attending: Family Medicine | Admitting: Family Medicine

## 2020-04-08 VITALS — BP 120/80 | HR 72 | Ht 61.5 in | Wt 131.0 lb

## 2020-04-08 DIAGNOSIS — I10 Essential (primary) hypertension: Secondary | ICD-10-CM | POA: Diagnosis not present

## 2020-04-08 DIAGNOSIS — Z1231 Encounter for screening mammogram for malignant neoplasm of breast: Secondary | ICD-10-CM

## 2020-04-08 DIAGNOSIS — E7801 Familial hypercholesterolemia: Secondary | ICD-10-CM

## 2020-04-08 MED ORDER — HYDROCHLOROTHIAZIDE 12.5 MG PO TABS: 12.5000 mg | ORAL_TABLET | Freq: Every day | ORAL | 1 refills | Status: DC

## 2020-04-08 MED ORDER — METOPROLOL SUCCINATE ER 25 MG PO TB24: 25.0000 mg | ORAL_TABLET | Freq: Every day | ORAL | 1 refills | Status: DC

## 2020-04-08 MED ORDER — ATORVASTATIN CALCIUM 10 MG PO TABS: 10.0000 mg | ORAL_TABLET | Freq: Every day | ORAL | 1 refills | Status: DC

## 2020-04-08 MED ORDER — FISH OIL 1000 MG PO CAPS
1.0000 | ORAL_CAPSULE | Freq: Every day | ORAL | 3 refills | Status: AC
Start: 2020-04-08 — End: ?

## 2020-04-08 NOTE — Progress Notes (Signed)
Date:  04/08/2020   Name:  Samantha Irwin [facility]   DOB:  August 05, 1960   MRN:  [id number]   Chief Complaint: Hypertension and Hyperlipidemia  Hypertension This is a chronic problem. The current episode started more than 1 year ago. The problem has been gradually improving since onset. The problem is controlled. Pertinent negatives include no anxiety, blurred vision, chest pain, headaches, malaise/fatigue, neck pain, orthopnea, palpitations, peripheral edema, PND, shortness of breath or sweats. There are no associated agents to hypertension. Risk factors for coronary artery disease include dyslipidemia. Past treatments include beta blockers and diuretics. The current treatment provides moderate improvement. There are no compliance problems.  There is no history of angina, kidney disease, CAD/MI, CVA, heart failure, left ventricular hypertrophy, PVD or retinopathy. There is no history of chronic renal disease, a hypertension causing med or renovascular disease.  Hyperlipidemia This is a chronic problem. The current episode started more than 1 year ago. The problem is controlled. Recent lipid tests were reviewed and are normal. She has no history of chronic renal disease, diabetes, hypothyroidism, liver disease, obesity or nephrotic syndrome. There are no known factors aggravating her hyperlipidemia. Pertinent negatives include no chest pain, myalgias or shortness of breath. Current antihyperlipidemic treatment includes statins (omega 3).    Lab Results  Component Value Date   CREATININE 1.14 (H) 03/19/2020   BUN 17 03/19/2020   NA 143 03/19/2020   K 4.9 03/19/2020   CL 101 03/19/2020   CO2 24 03/19/2020   Lab Results  Component Value Date   CHOL 265 (H) 03/19/2020   HDL 78 03/19/2020   LDLCALC 170 (H) 03/19/2020   TRIG 101 03/19/2020   CHOLHDL 3.2 05/30/2018   No results found for: TSH Lab Results  Component Value Date   HGBA1C 5.2 09/13/2016   Lab Results  Component Value Date    WBC 5.4 01/26/2018   HGB 13.5 01/26/2018   HCT 43.4 01/26/2018   MCV 94.1 01/26/2018   PLT 295 01/26/2018   No results found for: ALT, AST, GGT, ALKPHOS, BILITOT   Review of Systems  Constitutional: Negative.  Negative for chills, fatigue, fever, malaise/fatigue and unexpected weight change.  HENT: Negative for congestion, ear discharge, ear pain, rhinorrhea, sinus pressure, sneezing and sore throat.   Eyes: Negative for blurred vision, double vision, photophobia, pain, discharge, redness and itching.  Respiratory: Negative for cough, shortness of breath, wheezing and stridor.   Cardiovascular: Negative for chest pain, palpitations, orthopnea and PND.  Gastrointestinal: Negative for abdominal pain, blood in stool, constipation, diarrhea, nausea and vomiting.  Endocrine: Negative for cold intolerance, heat intolerance, polydipsia, polyphagia and polyuria.  Genitourinary: Negative for dysuria, flank pain, frequency, hematuria, menstrual problem, pelvic pain, urgency, vaginal bleeding and vaginal discharge.  Musculoskeletal: Negative for arthralgias, back pain, myalgias and neck pain.  Skin: Negative for rash.  Allergic/Immunologic: Negative for environmental allergies and food allergies.  Neurological: Negative for dizziness, weakness, light-headedness, numbness and headaches.  Hematological: Negative for adenopathy. Does not bruise/bleed easily.  Psychiatric/Behavioral: Negative for dysphoric mood. The patient is not nervous/anxious.     Patient Active Problem List   Diagnosis Date Noted  . Overactive bladder 05/30/2018  . Chronic obstructive pulmonary disease (HCC) 05/30/2018  . Familial hypercholesterolemia 05/30/2018  . Flexural eczema 05/30/2018  . Cigarette nicotine dependence without complication 05/30/2018  . Atherosclerosis of aorta (HCC) 05/12/2017  . Screening for colon cancer   . Essential hypertension 06/12/2015  . Annual physical exam 08/05/2014    No  Known  Allergies  Past Surgical History:  Procedure Laterality Date  . CESAREAN SECTION    . COLONOSCOPY  2012   cleared for 5 yrs- [place]  . COLONOSCOPY WITH PROPOFOL N/A 11/02/2016   Procedure: COLONOSCOPY WITH PROPOFOL;  Surgeon: Toney Reil, MD;  Location: [facility];  Service: Gastroenterology;  Laterality: N/A;    Social History   Tobacco Use  . Smoking status: Current Every Day Smoker    Packs/day: 0.00    Types: Cigarettes  . Smokeless tobacco: Never Used  . Tobacco comment: gave info on patches and pills  Vaping Use  . Vaping Use: Never used  Substance Use Topics  . Alcohol use: Yes    Alcohol/week: 3.0 standard drinks    Types: 3 Glasses of wine per week  . Drug use: No     Medication list has been reviewed and updated.  Current Meds  Medication Sig  . albuterol (VENTOLIN HFA) 108 (90 Base) MCG/ACT inhaler Inhale 2 puffs into the lungs every 6 (six) hours as needed for wheezing or shortness of breath.  Marland Kitchen atorvastatin (LIPITOR) 10 MG tablet Take 1 tablet (10 mg total) by mouth daily.  . B Complex Vitamins (B COMPLEX PO) Take by mouth daily.  . Biotin 10 MG TABS Take 1 tablet by mouth daily.  . carbamide peroxide (DEBROX) 6.5 % OTIC solution Place 5 drops into both ears 2 (two) times daily.  . cholecalciferol (VITAMIN D) 1000 UNITS tablet Take 1,000 Units by mouth daily.  . diclofenac Sodium (VOLTAREN) 1 % GEL Apply 2 g topically 4 (four) times daily.  Marland Kitchen etodolac (LODINE) 200 MG capsule Take 1 capsule (200 mg total) by mouth 2 (two) times daily.  . fluticasone furoate-vilanterol (BREO ELLIPTA) 100-25 MCG/INH AEPB Inhale 1 puff into the lungs in the morning.  . hydrochlorothiazide (HYDRODIURIL) 12.5 MG tablet Take 1 tablet by mouth once daily  . metoprolol succinate (TOPROL-XL) 25 MG 24 hr tablet Take 1 tablet by mouth once daily  . Multiple Vitamins-Minerals (MULTIVITAMIN WOMEN 50+ PO) Take 1 tablet by mouth daily.  . Omega-3 Fatty Acids (FISH OIL) 1000  MG CAPS Take 1 capsule (1,000 mg total) by mouth daily.  Marland Kitchen oxybutynin (DITROPAN-XL) 5 MG 24 hr tablet Take 1 tablet (5 mg total) by mouth at bedtime.  . triamcinolone ointment (KENALOG) 0.5 % Apply 1 application topically 2 (two) times daily.  . vitamin C (ASCORBIC ACID) 250 MG tablet Take 250 mg by mouth daily.   Current Facility-Administered Medications for the 04/08/20 encounter (Office Visit) with Duanne Limerick, MD  Medication  . ipratropium-albuterol (DUONEB) 0.5-2.5 (3) MG/3ML nebulizer solution 3 mL    PHQ 2/9 Scores 03/19/2020 02/04/2020 01/28/2020 08/24/2019  PHQ - 2 Score 0 0 0 0  PHQ- 9 Score 0 0 0 0    GAD 7 : Generalized Anxiety Score 03/19/2020 02/04/2020 01/28/2020 08/24/2019  Nervous, Anxious, on Edge 0 0 0 0  Control/stop worrying 0 0 0 0  Worry too much - different things 0 0 0 0  Trouble relaxing 0 0 0 0  Restless 0 0 0 0  Easily annoyed or irritable 0 0 0 0  Afraid - awful might happen 0 0 0 0  Total GAD 7 Score 0 0 0 0  Anxiety Difficulty - Not difficult at all - -    BP Readings from Last 3 Encounters:  04/08/20 120/80  03/19/20 120/80  02/04/20 124/70    Physical Exam Vitals and nursing  note reviewed.  Constitutional:      Appearance: She is well-developed and well-nourished.  HENT:     Head: Normocephalic.     Right Ear: Tympanic membrane, ear canal and external ear normal.     Left Ear: Tympanic membrane, ear canal and external ear normal.     Nose: Nose normal.     Mouth/Throat:     Mouth: Oropharynx is clear and moist. Mucous membranes are moist.  Eyes:     General: Lids are everted, no foreign bodies appreciated. No scleral icterus.       Left eye: No foreign body or hordeolum.     Extraocular Movements: EOM normal.     Conjunctiva/sclera: Conjunctivae normal.     Right eye: Right conjunctiva is not injected.     Left eye: Left conjunctiva is not injected.     Pupils: Pupils are equal, round, and reactive to light.  Neck:     Thyroid: No  thyromegaly.     Vascular: No JVD.     Trachea: No tracheal deviation.  Cardiovascular:     Rate and Rhythm: Normal rate and regular rhythm.     Pulses: Intact distal pulses.     Heart sounds: Normal heart sounds. No murmur heard. No friction rub. No gallop.   Pulmonary:     Effort: Pulmonary effort is normal. No respiratory distress.     Breath sounds: Normal breath sounds. No wheezing, rhonchi or rales.  Abdominal:     General: Bowel sounds are normal.     Palpations: Abdomen is soft. There is no hepatosplenomegaly or mass.     Tenderness: There is no abdominal tenderness. There is no guarding or rebound.  Musculoskeletal:        General: No tenderness or edema. Normal range of motion.     Cervical back: Normal range of motion and neck supple.  Lymphadenopathy:     Cervical: No cervical adenopathy.  Skin:    General: Skin is warm.     Findings: No rash.  Neurological:     Mental Status: She is alert and oriented to person, place, and time.     Cranial Nerves: No cranial nerve deficit.     Deep Tendon Reflexes: Strength normal. Reflexes normal.  Psychiatric:        Mood and Affect: Mood and affect normal. Mood is not anxious or depressed.     Wt Readings from Last 3 Encounters:  04/08/20 131 lb (59.4 kg)  03/19/20 129 lb (58.5 kg)  02/04/20 129 lb (58.5 kg)    BP 120/80   Pulse 72   Ht 5' 1.5" (1.562 m)   Wt 131 lb (59.4 kg)   BMI 24.35 kg/m   Assessment and Plan:   1. Essential hypertension Chronic. Controlled. Stable. Blood pressure today is 120/80. Continue hydrochlorothiazide 12.5 mg once a day as well as metoprolol XL 25 mg once a day. - hydrochlorothiazide (HYDRODIURIL) 12.5 MG tablet; Take 1 tablet (12.5 mg total) by mouth daily.  Dispense: 90 tablet; Refill: 1 - metoprolol succinate (TOPROL-XL) 25 MG 24 hr tablet; Take 1 tablet (25 mg total) by mouth daily.  Dispense: 90 tablet; Refill: 1  2. Familial hypercholesterolemia . Controlled. Stable. Continue  omega-3 1 g daily as well as atorvastatin 10 mg once a day. - Omega-3 Fatty Acids (FISH OIL) 1000 MG CAPS; Take 1 capsule (1,000 mg total) by mouth daily.  Dispense: 100 capsule; Refill: 3 - atorvastatin (LIPITOR) 10 MG tablet; Take 1 tablet (  10 mg total) by mouth daily.  Dispense: 90 tablet; Refill: 1

## 2020-06-03 ENCOUNTER — Emergency Department: Payer: BC Managed Care – PPO

## 2020-06-03 ENCOUNTER — Observation Stay: Payer: BC Managed Care – PPO

## 2020-06-03 ENCOUNTER — Other Ambulatory Visit: Payer: Self-pay

## 2020-06-03 ENCOUNTER — Inpatient Hospital Stay
Admission: EM | Admit: 2020-06-03 | Discharge: 2020-06-08 | DRG: 417 | Disposition: A | Discharge status: Home / Self Care | Payer: BC Managed Care – PPO | Attending: Internal Medicine | Admitting: Internal Medicine

## 2020-06-03 DIAGNOSIS — K9189 Other postprocedural complications and disorders of digestive system: Secondary | ICD-10-CM | POA: Diagnosis not present

## 2020-06-03 DIAGNOSIS — K259 Gastric ulcer, unspecified as acute or chronic, without hemorrhage or perforation: Secondary | ICD-10-CM | POA: Diagnosis present

## 2020-06-03 DIAGNOSIS — K2970 Gastritis, unspecified, without bleeding: Secondary | ICD-10-CM | POA: Diagnosis present

## 2020-06-03 DIAGNOSIS — R748 Abnormal levels of other serum enzymes: Secondary | ICD-10-CM

## 2020-06-03 DIAGNOSIS — R1013 Epigastric pain: Secondary | ICD-10-CM | POA: Diagnosis not present

## 2020-06-03 DIAGNOSIS — R7989 Other specified abnormal findings of blood chemistry: Secondary | ICD-10-CM | POA: Diagnosis present

## 2020-06-03 DIAGNOSIS — K449 Diaphragmatic hernia without obstruction or gangrene: Secondary | ICD-10-CM | POA: Diagnosis present

## 2020-06-03 DIAGNOSIS — E876 Hypokalemia: Secondary | ICD-10-CM | POA: Diagnosis present

## 2020-06-03 DIAGNOSIS — Z20822 Contact with and (suspected) exposure to covid-19: Secondary | ICD-10-CM | POA: Diagnosis present

## 2020-06-03 DIAGNOSIS — R109 Unspecified abdominal pain: Secondary | ICD-10-CM

## 2020-06-03 DIAGNOSIS — K660 Peritoneal adhesions (postprocedural) (postinfection): Secondary | ICD-10-CM | POA: Diagnosis present

## 2020-06-03 DIAGNOSIS — K8040 Calculus of bile duct with cholecystitis, unspecified, without obstruction: Principal | ICD-10-CM | POA: Diagnosis present

## 2020-06-03 DIAGNOSIS — J440 Chronic obstructive pulmonary disease with acute lower respiratory infection: Secondary | ICD-10-CM

## 2020-06-03 DIAGNOSIS — I10 Essential (primary) hypertension: Secondary | ICD-10-CM | POA: Diagnosis present

## 2020-06-03 DIAGNOSIS — E7801 Familial hypercholesterolemia: Secondary | ICD-10-CM | POA: Diagnosis present

## 2020-06-03 DIAGNOSIS — R932 Abnormal findings on diagnostic imaging of liver and biliary tract: Secondary | ICD-10-CM

## 2020-06-03 DIAGNOSIS — E785 Hyperlipidemia, unspecified: Secondary | ICD-10-CM | POA: Diagnosis present

## 2020-06-03 DIAGNOSIS — Y848 Other medical procedures as the cause of abnormal reaction of the patient, or of later complication, without mention of misadventure at the time of the procedure: Secondary | ICD-10-CM | POA: Diagnosis not present

## 2020-06-03 DIAGNOSIS — R188 Other ascites: Secondary | ICD-10-CM | POA: Diagnosis present

## 2020-06-03 DIAGNOSIS — K838 Other specified diseases of biliary tract: Secondary | ICD-10-CM | POA: Diagnosis not present

## 2020-06-03 DIAGNOSIS — D500 Iron deficiency anemia secondary to blood loss (chronic): Secondary | ICD-10-CM | POA: Diagnosis present

## 2020-06-03 DIAGNOSIS — J209 Acute bronchitis, unspecified: Secondary | ICD-10-CM

## 2020-06-03 DIAGNOSIS — Z79899 Other long term (current) drug therapy: Secondary | ICD-10-CM

## 2020-06-03 DIAGNOSIS — J449 Chronic obstructive pulmonary disease, unspecified: Secondary | ICD-10-CM | POA: Diagnosis present

## 2020-06-03 DIAGNOSIS — K8050 Calculus of bile duct without cholangitis or cholecystitis without obstruction: Secondary | ICD-10-CM | POA: Diagnosis present

## 2020-06-03 DIAGNOSIS — K31819 Angiodysplasia of stomach and duodenum without bleeding: Secondary | ICD-10-CM | POA: Diagnosis present

## 2020-06-03 DIAGNOSIS — K859 Acute pancreatitis without necrosis or infection, unspecified: Secondary | ICD-10-CM | POA: Diagnosis not present

## 2020-06-03 DIAGNOSIS — F17210 Nicotine dependence, cigarettes, uncomplicated: Secondary | ICD-10-CM | POA: Diagnosis present

## 2020-06-03 DIAGNOSIS — F1010 Alcohol abuse, uncomplicated: Secondary | ICD-10-CM | POA: Diagnosis present

## 2020-06-03 DIAGNOSIS — K828 Other specified diseases of gallbladder: Secondary | ICD-10-CM | POA: Diagnosis present

## 2020-06-03 LAB — URINALYSIS, COMPLETE (UACMP) WITH MICROSCOPIC
Bacteria, UA: NONE SEEN
Bilirubin Urine: NEGATIVE
Glucose, UA: NEGATIVE mg/dL
Hgb urine dipstick: NEGATIVE
Ketones, ur: 5 mg/dL — AB
Leukocytes,Ua: NEGATIVE
Nitrite: NEGATIVE
Protein, ur: NEGATIVE mg/dL
Specific Gravity, Urine: 1.016 (ref 1.005–1.030)
pH: 5 (ref 5.0–8.0)

## 2020-06-03 LAB — CBC
HCT: 32.7 % — ABNORMAL LOW (ref 36.0–46.0)
Hemoglobin: 10.6 g/dL — ABNORMAL LOW (ref 12.0–15.0)
MCH: 29.7 pg (ref 26.0–34.0)
MCHC: 32.4 g/dL (ref 30.0–36.0)
MCV: 91.6 fL (ref 80.0–100.0)
Platelets: 253 10*3/uL (ref 150–400)
RBC: 3.57 MIL/uL — ABNORMAL LOW (ref 3.87–5.11)
RDW: 11.5 % (ref 11.5–15.5)
WBC: 5.4 10*3/uL (ref 4.0–10.5)
nRBC: 0 % (ref 0.0–0.2)

## 2020-06-03 LAB — LIPASE, BLOOD: Lipase: 33 U/L (ref 11–51)

## 2020-06-03 LAB — COMPREHENSIVE METABOLIC PANEL
ALT: 88 U/L — ABNORMAL HIGH (ref 0–44)
ALT: 167 U/L — ABNORMAL HIGH (ref 0–44)
AST: 296 U/L — ABNORMAL HIGH (ref 15–41)
AST: 288 U/L — ABNORMAL HIGH (ref 15–41)
Albumin: 4 g/dL (ref 3.5–5.0)
Albumin: 3.5 g/dL (ref 3.5–5.0)
Alkaline Phosphatase: 130 U/L — ABNORMAL HIGH (ref 38–126)
Alkaline Phosphatase: 158 U/L — ABNORMAL HIGH (ref 38–126)
Anion gap: 13 (ref 5–15)
Anion gap: 10 (ref 5–15)
BUN: 22 mg/dL (ref 8–23)
BUN: 25 mg/dL — ABNORMAL HIGH (ref 8–23)
CO2: 24 mmol/L (ref 22–32)
CO2: 25 mmol/L (ref 22–32)
Calcium: 9.2 mg/dL (ref 8.9–10.3)
Calcium: 8.8 mg/dL — ABNORMAL LOW (ref 8.9–10.3)
Chloride: 99 mmol/L (ref 98–111)
Chloride: 100 mmol/L (ref 98–111)
Creatinine, Ser: 0.86 mg/dL (ref 0.44–1.00)
Creatinine, Ser: 1.22 mg/dL — ABNORMAL HIGH (ref 0.44–1.00)
GFR, Estimated: >60 mL/min (ref >60)
GFR, Estimated: 50 mL/min — ABNORMAL LOW (ref >60)
Glucose, Bld: 111 mg/dL — ABNORMAL HIGH (ref 70–99)
Glucose, Bld: 143 mg/dL — ABNORMAL HIGH (ref 70–99)
Potassium: 3.2 mmol/L — ABNORMAL LOW (ref 3.5–5.1)
Potassium: 3.2 mmol/L — ABNORMAL LOW (ref 3.5–5.1)
Sodium: 136 mmol/L (ref 135–145)
Sodium: 135 mmol/L (ref 135–145)
Total Bilirubin: 1.1 mg/dL (ref 0.3–1.2)
Total Bilirubin: 1.3 mg/dL — ABNORMAL HIGH (ref 0.3–1.2)
Total Protein: 7.4 g/dL (ref 6.5–8.1)
Total Protein: 6.6 g/dL (ref 6.5–8.1)

## 2020-06-03 LAB — HIV ANTIBODY (ROUTINE TESTING W REFLEX): HIV Screen 4th Generation wRfx: NONREACTIVE

## 2020-06-03 LAB — PROTIME-INR
INR: 1 (ref 0.8–1.2)
Prothrombin Time: 13.1 seconds (ref 11.4–15.2)

## 2020-06-03 LAB — MAGNESIUM: Magnesium: 1.7 mg/dL (ref 1.7–2.4)

## 2020-06-03 LAB — PHOSPHORUS: Phosphorus: 4.4 mg/dL (ref 2.5–4.6)

## 2020-06-03 MED ORDER — LACTATED RINGERS IV SOLN: INTRAVENOUS | Status: DC

## 2020-06-03 MED ORDER — THIAMINE HCL 100 MG/ML IJ SOLN
100.0000 mg | Freq: Every day | INTRAMUSCULAR | Status: DC
  Administered 2020-06-06: 13:00:00 100 mg via INTRAVENOUS
  Filled 2020-06-03: qty 2

## 2020-06-03 MED ORDER — THIAMINE HCL 100 MG PO TABS
100.0000 mg | ORAL_TABLET | Freq: Every day | ORAL | Status: DC
  Administered 2020-06-04 – 2020-06-08 (×3): 100 mg via ORAL
  Filled 2020-06-03 (×3): qty 1

## 2020-06-03 MED ORDER — ALBUTEROL SULFATE HFA 108 (90 BASE) MCG/ACT IN AERS
2.0000 | INHALATION_SPRAY | Freq: Four times a day (QID) | RESPIRATORY_TRACT | Status: DC | PRN
  Filled 2020-06-03: qty 6.7

## 2020-06-03 MED ORDER — FLUTICASONE FUROATE-VILANTEROL 100-25 MCG/INH IN AEPB
1.0000 | INHALATION_SPRAY | Freq: Every day | RESPIRATORY_TRACT | Status: DC
  Administered 2020-06-04 – 2020-06-08 (×3): 1 via RESPIRATORY_TRACT
  Filled 2020-06-03: qty 28

## 2020-06-03 MED ORDER — KETOROLAC TROMETHAMINE 15 MG/ML IJ SOLN
15.0000 mg | Freq: Three times a day (TID) | INTRAMUSCULAR | Status: DC | PRN
  Filled 2020-06-03: qty 1

## 2020-06-03 MED ORDER — ENOXAPARIN SODIUM 40 MG/0.4ML ~~LOC~~ SOLN
40.0000 mg | Freq: Every day | SUBCUTANEOUS | Status: DC
  Administered 2020-06-03 – 2020-06-07 (×5): 40 mg via SUBCUTANEOUS
  Filled 2020-06-03 (×5): qty 0.4

## 2020-06-03 MED ORDER — VITAMIN D 25 MCG (1000 UNIT) PO TABS
1000.0000 [IU] | ORAL_TABLET | Freq: Every day | ORAL | Status: DC
  Administered 2020-06-04 – 2020-06-08 (×2): 1000 [IU] via ORAL
  Filled 2020-06-03 (×2): qty 1

## 2020-06-03 MED ORDER — HYDROCHLOROTHIAZIDE 25 MG PO TABS
12.5000 mg | ORAL_TABLET | Freq: Every day | ORAL | Status: DC
  Administered 2020-06-04 – 2020-06-05 (×2): 12.5 mg via ORAL
  Filled 2020-06-03 (×3): qty 1

## 2020-06-03 MED ORDER — ONDANSETRON HCL 4 MG/2ML IJ SOLN
4.0000 mg | Freq: Four times a day (QID) | INTRAMUSCULAR | Status: DC | PRN
  Administered 2020-06-05 (×2): 4 mg via INTRAVENOUS
  Filled 2020-06-03 (×3): qty 2

## 2020-06-03 MED ORDER — SODIUM CHLORIDE 0.9 % IV SOLN: Freq: Once | INTRAVENOUS | Status: AC

## 2020-06-03 MED ORDER — LORAZEPAM 2 MG/ML IJ SOLN: 1.0000 mg | INTRAMUSCULAR | Status: AC | PRN

## 2020-06-03 MED ORDER — KETOROLAC TROMETHAMINE 30 MG/ML IJ SOLN
15.0000 mg | Freq: Once | INTRAMUSCULAR | Status: AC
  Administered 2020-06-03: 15 mg via INTRAVENOUS
  Filled 2020-06-03: qty 1

## 2020-06-03 MED ORDER — ASCORBIC ACID 500 MG PO TABS
250.0000 mg | ORAL_TABLET | Freq: Every day | ORAL | Status: DC
  Administered 2020-06-04: 10:00:00 250 mg via ORAL
  Filled 2020-06-03: qty 1

## 2020-06-03 MED ORDER — ADULT MULTIVITAMIN W/MINERALS CH
1.0000 | ORAL_TABLET | Freq: Every day | ORAL | Status: DC
  Administered 2020-06-04: 10:00:00 1 via ORAL
  Filled 2020-06-03: qty 1

## 2020-06-03 MED ORDER — FOLIC ACID 1 MG PO TABS
1.0000 mg | ORAL_TABLET | Freq: Every day | ORAL | Status: DC
  Administered 2020-06-04: 1 mg via ORAL
  Filled 2020-06-03: qty 1

## 2020-06-03 MED ORDER — VITAMIN D 25 MCG (1000 UNIT) PO TABS: 1000.0000 [IU] | ORAL_TABLET | Freq: Every day | ORAL | Status: DC

## 2020-06-03 MED ORDER — ASCORBIC ACID 500 MG PO TABS: 250.0000 mg | ORAL_TABLET | Freq: Every day | ORAL | Status: DC

## 2020-06-03 MED ORDER — IOHEXOL 300 MG/ML  SOLN
100.0000 mL | Freq: Once | INTRAMUSCULAR | Status: AC | PRN
  Administered 2020-06-03: 100 mL via INTRAVENOUS

## 2020-06-03 MED ORDER — METOPROLOL SUCCINATE ER 25 MG PO TB24
25.0000 mg | ORAL_TABLET | Freq: Every day | ORAL | Status: DC
  Administered 2020-06-04 – 2020-06-05 (×2): 25 mg via ORAL
  Filled 2020-06-03 (×3): qty 1

## 2020-06-03 MED ORDER — OXYBUTYNIN CHLORIDE ER 5 MG PO TB24
5.0000 mg | ORAL_TABLET | Freq: Every day | ORAL | Status: DC
  Administered 2020-06-03 – 2020-06-07 (×3): 5 mg via ORAL
  Filled 2020-06-03 (×7): qty 1

## 2020-06-03 MED ORDER — IBUPROFEN 400 MG PO TABS: 400.0000 mg | ORAL_TABLET | Freq: Four times a day (QID) | ORAL | Status: DC | PRN

## 2020-06-03 MED ORDER — ONDANSETRON HCL 4 MG PO TABS: 4.0000 mg | ORAL_TABLET | Freq: Four times a day (QID) | ORAL | Status: DC | PRN

## 2020-06-03 MED ORDER — MORPHINE SULFATE (PF) 2 MG/ML IV SOLN
0.5000 mg | INTRAVENOUS | Status: DC | PRN
  Administered 2020-06-05 (×2): 0.5 mg via INTRAVENOUS
  Filled 2020-06-03 (×2): qty 1

## 2020-06-03 MED ORDER — LORAZEPAM 1 MG PO TABS: 1.0000 mg | ORAL_TABLET | ORAL | Status: AC | PRN

## 2020-06-03 NOTE — Consult Note (Signed)
Cephas Darby, MD [address]  [place]  [place], [place] [address]  Main: [phone]  Fax: [phone] Pager: [phone]   Consultation  Referring Provider:     No ref. provider found Primary Care Physician:  Juline Patch, MD Primary Gastroenterologist: Althia Forts       Reason for Consultation:   Epigastric pain, elevated LFTs  Date of Admission:  06/03/2020 Date of Consultation:  06/03/2020         HPI:   Samantha Irwin is a 60 y.o. female presented with acute onset of epigastric pain radiating to right lower back. Started this morning suddenly when she was getting ready for breakfast, pain was severe, doubled over, not a/w nausea or vomiting. Never experienced similar pain. Denies any fever, Drinks 2-3 glasses of wine daily. Labs revealed AP 130, AST 296, ALT 88, TB 1.1, CT A/P revealed dilated CBD. Normal coags, lipase, mild anemia  She is an Automotive engineer for pre-K  NSAIDs: none  Antiplts/Anticoagulants/Anti thrombotics: none  GI Procedures:  Colonoscopy 11/02/2016 - Three 3 to 5 mm polyps in the rectum, removed with a cold snare. Resected and retrieved. - Diverticulosis in the sigmoid colon. - The examination was otherwise normal on direct and retroflexion views.  Hyperplastic polyps  Past Medical History:  Diagnosis Date  . Hypertension   . Wears dentures    full upper, partial lower    Past Surgical History:  Procedure Laterality Date  . CESAREAN SECTION    . COLONOSCOPY  2012   cleared for 5 yrs- [place]  . COLONOSCOPY WITH PROPOFOL N/A 11/02/2016   Procedure: COLONOSCOPY WITH PROPOFOL;  Surgeon: Lin Landsman, MD;  Location: [facility];  Service: Gastroenterology;  Laterality: N/A;    Prior to Admission medications   Medication Sig Start Date End Date Taking? Authorizing Provider  albuterol (VENTOLIN HFA) 108 (90 Base) MCG/ACT inhaler Inhale 2 puffs into the lungs every 6 (six) hours as needed for  wheezing or shortness of breath. 05/30/18  Yes Juline Patch, MD  atorvastatin (LIPITOR) 10 MG tablet Take 1 tablet (10 mg total) by mouth daily. 04/08/20  Yes Juline Patch, MD  B Complex Vitamins (B COMPLEX PO) Take by mouth daily.   Yes [provider]  Biotin 10 MG TABS Take 1 tablet by mouth daily.   Yes [provider]  carbamide peroxide (DEBROX) 6.5 % OTIC solution Place 5 drops into both ears 2 (two) times daily. 02/27/19  Yes Juline Patch, MD  cholecalciferol (VITAMIN D) 1000 UNITS tablet Take 1,000 Units by mouth daily.   Yes [provider]  diclofenac Sodium (VOLTAREN) 1 % GEL Apply 2 g topically 4 (four) times daily. 03/19/20  Yes Juline Patch, MD  hydrochlorothiazide (HYDRODIURIL) 12.5 MG tablet Take 1 tablet (12.5 mg total) by mouth daily. 04/08/20  Yes Juline Patch, MD  Multiple Vitamins-Minerals (MULTIVITAMIN WOMEN 50+ PO) Take 1 tablet by mouth daily.   Yes [provider]  Omega-3 Fatty Acids (FISH OIL) 1000 MG CAPS Take 1 capsule (1,000 mg total) by mouth daily. 04/08/20  Yes Juline Patch, MD  oxybutynin (DITROPAN-XL) 5 MG 24 hr tablet Take 1 tablet (5 mg total) by mouth at bedtime. 08/24/19  Yes Juline Patch, MD  triamcinolone ointment (KENALOG) 0.5 % Apply 1 application topically 2 (two) times daily. 05/30/18  Yes Juline Patch, MD  vitamin C (ASCORBIC ACID) 250 MG tablet Take 250 mg  by mouth daily.   Yes [provider]  etodolac (LODINE) 200 MG capsule Take 1 capsule (200 mg total) by mouth 2 (two) times daily. Patient not taking: No sig reported 06/09/18   Duanne Limerick, MD  fluticasone furoate-vilanterol (BREO ELLIPTA) 100-25 MCG/INH AEPB Inhale 1 puff into the lungs in the morning. Patient not taking: No sig reported 02/04/20   Duanne Limerick, MD  metoprolol succinate (TOPROL-XL) 25 MG 24 hr tablet Take 1 tablet (25 mg total) by mouth daily. 04/08/20   Duanne Limerick, MD   Current Facility-Administered Medications:   .  albuterol (VENTOLIN HFA) 108 (90 Base) MCG/ACT inhaler 2 puff, 2 puff, Inhalation, Q6H PRN, Cox, Amy N, DO .  [START ON 06/04/2020] ascorbic acid (VITAMIN C) tablet 250 mg, 250 mg, Oral, Daily, Cox, Amy N, DO .  [START ON 06/04/2020] cholecalciferol (VITAMIN D3) tablet 1,000 Units, 1,000 Units, Oral, Daily, Cox, Amy N, DO .  enoxaparin (LOVENOX) injection 40 mg, 40 mg, Subcutaneous, Q2200, Cox, Amy N, DO, 40 mg at 06/03/20 2148 .  [START ON 06/04/2020] fluticasone furoate-vilanterol (BREO ELLIPTA) 100-25 MCG/INH 1 puff, 1 puff, Inhalation, Daily, Cox, Amy N, DO .  [START ON 06/04/2020] folic acid (FOLVITE) tablet 1 mg, 1 mg, Oral, Daily, Cox, Amy N, DO .  [START ON 06/04/2020] hydrochlorothiazide (HYDRODIURIL) tablet 12.5 mg, 12.5 mg, Oral, Daily, Cox, Amy N, DO .  ibuprofen (ADVIL) tablet 400 mg, 400 mg, Oral, Q6H PRN, Cox, Amy N, DO .  ketorolac (TORADOL) 15 MG/ML injection 15 mg, 15 mg, Intravenous, Q8H PRN, Cox, Amy N, DO .  lactated ringers infusion, , Intravenous, Continuous, Cox, Amy N, DO, Last Rate: 125 mL/hr at 06/03/20 1653, New Bag at 06/03/20 1653 .  LORazepam (ATIVAN) tablet 1-4 mg, 1-4 mg, Oral, Q1H PRN **OR** LORazepam (ATIVAN) injection 1-4 mg, 1-4 mg, Intravenous, Q1H PRN, Cox, Amy N, DO .  [START ON 06/04/2020] metoprolol succinate (TOPROL-XL) 24 hr tablet 25 mg, 25 mg, Oral, Daily, Cox, Amy N, DO .  morphine 2 MG/ML injection 0.5 mg, 0.5 mg, Intravenous, Q4H PRN, Cox, Amy N, DO .  [START ON 06/04/2020] multivitamin with minerals tablet 1 tablet, 1 tablet, Oral, Daily, Cox, Amy N, DO .  ondansetron (ZOFRAN) tablet 4 mg, 4 mg, Oral, Q6H PRN **OR** ondansetron (ZOFRAN) injection 4 mg, 4 mg, Intravenous, Q6H PRN, Cox, Amy N, DO .  oxybutynin (DITROPAN-XL) 24 hr tablet 5 mg, 5 mg, Oral, QHS, Cox, Amy N, DO, 5 mg at 06/03/20 2014 .  [START ON 06/04/2020] thiamine tablet 100 mg, 100 mg, Oral, Daily **OR** [START ON 06/04/2020] thiamine (B-1) injection 100 mg, 100 mg, Intravenous, Daily, Cox,  Amy N, DO   Family History  Problem Relation Age of Onset  . Diabetes Mother   . Breast cancer Neg Hx      Social History   Tobacco Use  . Smoking status: Current Every Day Smoker    Packs/day: 0.00    Types: Cigarettes  . Smokeless tobacco: Never Used  . Tobacco comment: gave info on patches and pills  Vaping Use  . Vaping Use: Never used  Substance Use Topics  . Alcohol use: Yes    Alcohol/week: 3.0 standard drinks    Types: 3 Glasses of wine per week  . Drug use: No    Allergies as of 06/03/2020  . (No Known Allergies)    Review of Systems:    All systems reviewed and negative except where noted in HPI.  Physical Exam:  Vital signs in last 24 hours: Temp:  [97.5 F (36.4 C)-98 F (36.7 C)] 97.5 F (36.4 C) (04/26 2034) Pulse Rate:  [65-74] 71 (04/26 2034) Resp:  [16-18] 16 (04/26 2034) BP: (124-159)/(61-62) 124/61 (04/26 2034) SpO2:  [97 %-100 %] 100 % (04/26 2034) Weight:  [60.8 kg] 60.8 kg (04/26 1257) Last BM Date: 06/02/20 General:   Pleasant, cooperative in NAD Head:  Normocephalic and atraumatic. Eyes:   No icterus.   Conjunctiva congested. PERRLA. Ears:  Normal auditory acuity. Neck:  Supple; no masses or thyroidomegaly Lungs: Respirations even and unlabored. Lungs clear to auscultation bilaterally.   No wheezes, crackles, or rhonchi.  Heart:  Regular rate and rhythm;  Without murmur, clicks, rubs or gallops Abdomen:  Soft, nondistended, epigastric tenderness. Normal bowel sounds. No appreciable masses or hepatomegaly.  No rebound or guarding.  Rectal:  Not performed. Msk:  Symmetrical without gross deformities.  Strength normal  Extremities:  Without edema, cyanosis or clubbing. Neurologic:  Alert and oriented x3;  grossly normal neurologically. Skin:  Intact without significant lesions or rashes. Psych:  Alert and cooperative. Normal affect.  LAB RESULTS: CBC Latest Ref Rng & Units 06/03/2020 01/26/2018 09/13/2016  WBC 4.0 - 10.5 K/uL 5.4 5.4  6.6  Hemoglobin 12.0 - 15.0 g/dL 10.6(L) 13.5 12.4  Hematocrit 36.0 - 46.0 % 32.7(L) 43.4 37.4  Platelets 150 - 400 K/uL 253 295 297    BMET BMP Latest Ref Rng & Units 06/03/2020 03/19/2020 02/27/2019  Glucose 70 - 99 mg/dL 111(H) 95 100(H)  BUN 8 - 23 mg/dL $Remove'22 17 14  'FohuHgi$ Creatinine 0.44 - 1.00 mg/dL 0.86 1.14(H) 1.02(H)  BUN/Creat Ratio 12 - 28 - 15 14  Sodium 135 - 145 mmol/L 136 143 142  Potassium 3.5 - 5.1 mmol/L 3.2(L) 4.9 4.5  Chloride 98 - 111 mmol/L 99 101 102  CO2 22 - 32 mmol/L $RemoveB'24 24 25  'XoebMqGH$ Calcium 8.9 - 10.3 mg/dL 9.2 10.3 10.1    LFT Hepatic Function Latest Ref Rng & Units 06/03/2020 03/19/2020 02/27/2019  Total Protein 6.5 - 8.1 g/dL 7.4 - -  Albumin 3.5 - 5.0 g/dL 4.0 4.5 4.4  AST 15 - 41 U/L 296(H) - -  ALT 0 - 44 U/L 88(H) - -  Alk Phosphatase 38 - 126 U/L 130(H) - -  Total Bilirubin 0.3 - 1.2 mg/dL 1.1 - -     STUDIES: CT ABDOMEN PELVIS W CONTRAST  Result Date: 06/03/2020 CLINICAL DATA:  Epigastric pain over the last several days. Bowel obstruction suspected. EXAM: CT ABDOMEN AND PELVIS WITH CONTRAST TECHNIQUE: Multidetector CT imaging of the abdomen and pelvis was performed using the standard protocol following bolus administration of intravenous contrast. CONTRAST:  110mL OMNIPAQUE IOHEXOL 300 MG/ML  SOLN COMPARISON:  None. FINDINGS: Lower chest: Normal Hepatobiliary: The liver has a normal appearance without focal lesions or intrahepatic biliary ductal dilatation. Common bile duct is somewhat prominent, measuring up to 9 mm in diameter, but I do not see an obstructing lesion in the distal duct or ampullary region. No calcified gallstones. Pancreas: Normal Spleen: Normal Adrenals/Urinary Tract: Adrenal glands are normal. Kidneys are normal. Bladder is normal. Stomach/Bowel: Stomach and small intestine are normal. The appendix is normal. No colon pathology is evident. Amount of fecal matter within normal limits. Diverticulosis of the left colon without evidence of diverticulitis.  Vascular/Lymphatic: Aortic atherosclerosis. No aneurysm. IVC is normal. No retroperitoneal adenopathy. Reproductive: Normal.  No pelvic mass. Other: No free fluid or air.  No hernia.  Musculoskeletal: Normal.  No significant degenerative changes. IMPRESSION: 1. No acute finding to explain the clinical presentation. No evidence of bowel obstruction or other bowel pathology. Diverticulosis of the left colon without evidence of diverticulitis. 2. Aortic atherosclerosis. 3. Common bile duct is somewhat prominent, measuring up to 9 mm in diameter, but I do not see an obstructing lesion in the distal duct or ampullary region. Aortic Atherosclerosis (ICD10-I70.0). Electronically Signed   By: Nelson Chimes M.D.   On: 06/03/2020 14:39   MR ABDOMEN MRCP WO CONTRAST  Result Date: 06/03/2020 CLINICAL DATA:  Epigastric pain for several days. Biliary ductal dilatation on recent CT. EXAM: MRI ABDOMEN WITHOUT CONTRAST  (INCLUDING MRCP) TECHNIQUE: Multiplanar multisequence MR imaging of the abdomen was performed. Heavily T2-weighted images of the biliary and pancreatic ducts were obtained, and three-dimensional MRCP images were rendered by post processing. COMPARISON:  CT on 06/03/2020 and ultrasound on 10/09/2013 FINDINGS: Lower chest: No acute findings. Hepatobiliary: No masses visualized on this unenhanced exam. Gallbladder is unremarkable. Mild diffuse biliary ductal dilatation is seen with common bile duct measuring 11 mm. No evidence of choledocholithiasis or biliary stricture. Pancreas: No mass or inflammatory process visualized on this unenhanced exam. No evidence of pancreatic ductal dilatation or pancreas divisum. Spleen:  Within normal limits in size. Adrenals/Urinary tract: Unremarkable. No evidence of nephrolithiasis or hydronephrosis. Stomach/Bowel: Visualized portion unremarkable. Vascular/Lymphatic: No pathologically enlarged lymph nodes identified. No evidence of abdominal aortic aneurysm. Other:  None.  Musculoskeletal:  No suspicious bone lesions identified. IMPRESSION: Mild diffuse biliary ductal dilatation, with common bile duct measuring 11 mm. No radiographic evidence of choledocholithiasis or other obstructing etiology. Electronically Signed   By: Marlaine Hind M.D.   On: 06/03/2020 18:55   MR 3D Recon At Scanner  Result Date: 06/03/2020 CLINICAL DATA:  Epigastric pain for several days. Biliary ductal dilatation on recent CT. EXAM: MRI ABDOMEN WITHOUT CONTRAST  (INCLUDING MRCP) TECHNIQUE: Multiplanar multisequence MR imaging of the abdomen was performed. Heavily T2-weighted images of the biliary and pancreatic ducts were obtained, and three-dimensional MRCP images were rendered by post processing. COMPARISON:  CT on 06/03/2020 and ultrasound on 10/09/2013 FINDINGS: Lower chest: No acute findings. Hepatobiliary: No masses visualized on this unenhanced exam. Gallbladder is unremarkable. Mild diffuse biliary ductal dilatation is seen with common bile duct measuring 11 mm. No evidence of choledocholithiasis or biliary stricture. Pancreas: No mass or inflammatory process visualized on this unenhanced exam. No evidence of pancreatic ductal dilatation or pancreas divisum. Spleen:  Within normal limits in size. Adrenals/Urinary tract: Unremarkable. No evidence of nephrolithiasis or hydronephrosis. Stomach/Bowel: Visualized portion unremarkable. Vascular/Lymphatic: No pathologically enlarged lymph nodes identified. No evidence of abdominal aortic aneurysm. Other:  None. Musculoskeletal:  No suspicious bone lesions identified. IMPRESSION: Mild diffuse biliary ductal dilatation, with common bile duct measuring 11 mm. No radiographic evidence of choledocholithiasis or other obstructing etiology. Electronically Signed   By: Marlaine Hind M.D.   On: 06/03/2020 18:55      Impression / Plan:   Chundra Delois Herne is a 60 y.o. female with acute epigastric pain, elevated LFTs and imaging evidence of dilated CBD. No  evidence of cholelithiasis or choledocholithiasis on CT AST > 2x ALT suggestive of alcoholic liver injury, mild Alcholic Hepatitis is on differential Recommend MRCP No evidence of ascending cholangitis or acute pancreatitis or acute cholecytistits Recommend viral hepatitis panel Ok with CLD Abstinence from ETOH use Tentative plan for ERCP on Thursday pending clinical progress and imaging findings Monitor LFTs daily   Thank you  for involving me in the care of this patient.      LOS: 0 days   Sherri Sear, MD  06/03/2020, 10:38 PM   Note: This dictation was prepared with Dragon dictation along with smaller phrase technology. Any transcriptional errors that result from this process are unintentional.

## 2020-06-03 NOTE — Plan of Care (Signed)

## 2020-06-03 NOTE — ED Triage Notes (Signed)
Pt comes into the ED via EMS from work, states she was getting ready to eat her lunch and had sudden onset mid abd pain that radiated into her back, pain is just in the abd at present. Denies N/V/D, last BM was yesterday

## 2020-06-03 NOTE — ED Notes (Signed)
Patient to MRI who will take patient to inpatient bed after study. IP RN aware.

## 2020-06-03 NOTE — H&P (Addendum)
History and Physical   Samantha Irwin FOY:[id number] DOB: [birth year] DOA: 06/03/2020  PCP: Duanne Limerick, MD  Patient coming from: Home  I have personally briefly reviewed patient's old medical records in [facility] EMR.  Chief Concern: abdominal pain   HPI: Samantha Irwin is a 60 y.o. female with medical history significant for hyperlipidemia, hypertension, daily alcohol use, postmenasal, tobacco use, presents to the emergency department for chief concerns of abdominal pain.  Patient states that the abdominal pain started around 11/11:20 AM on day of admission.  She states she has never had this pain before.  She describes the pain as sharp stabbing pain and palpation makes it worse.  She states that the location of the pain is periumbilical and epigastric.    She states the pain is 10/10 and now improved to a 5/10. She states the pain comes and go, she endorses radiation to her back. She states the pain lasts seconds and is intermittent, this lasted about 1 hour.  She states her last BM was normal and it was 06/03/2020 and nausea and vomiting.   She denies regular ibuprofen, NSAID use.  Social history: lives alone, smokes 3 cigarettes per day, started at age 58, she drinks 2-3 glasses of wine per day, a 750 bottle lasts two days per day. No recreational drug use.  Health maintenance - last colonoscopy was 5-6 years, she has had two colonoscopy  Vaccinations: Patient is fully vaccinated for COVID-19  ROS: Constitutional: no weight change, no fever ENT/Mouth: no sore throat, no rhinorrhea Eyes: no eye pain, no vision changes Cardiovascular: no chest pain, no dyspnea,  no edema, no palpitations Respiratory: no cough, no sputum, no wheezing Gastrointestinal: no nausea, no vomiting, no diarrhea, no constipation Genitourinary: no urinary incontinence, no dysuria, no hematuria Musculoskeletal: no arthralgias, no myalgias Skin: no skin lesions, no pruritus, Neuro: +  weakness, no loss of consciousness, no syncope Psych: no anxiety, no depression, + decrease appetite Heme/Lymph: no bruising, no bleeding  ED Course: Discussed with ED provider, patient requiring hospitalization for elevated liver enzymes.  Vitals in the emergency department was remarkable for temperature of 98, respiration rate of 18, heart rate 74, blood pressure 124/62, SPO2 of 97% on room air.  Labs in the emergency department was remarkable for sodium 136, potassium 3.2, chloride 99, CO2 24, nonfasting blood glucose of 111, serum creatinine 0.86, AST 296, ALT 88, WBC 5.4, hemoglobin 10.6, hematocrit 32.7, platelets 253.  UA was negative for leukocytes and nitrites.  EDP discussed with GI and they recommended MRCP.  Assessment/Plan  Principal Problem:   Elevated LFTs Active Problems:   Essential hypertension   Chronic obstructive pulmonary disease (HCC)   Familial hypercholesterolemia   Cigarette nicotine dependence without complication   Alcohol abuse   # Abdominal pain # Elevated LFT-suspect secondary to alcohol abuse, statin use and or CBD cannot be excluded - MRCP - GI consulted by EDP and recommended observation, for MRCP - Ibuprofen 400 mg every 6 hours as needed for fever, headache, mild pain, ketorolac 50 mg IV every 8 hours for moderate pain, morphine 0.5 mg IV every 4 hours as needed for severe pain - Hepatic function panel in a.m. - Atorvastatin 10 mg nightly has not been resumed - LR 125 ml per hour, 1 day ordered  # Alcohol abuse-patient drinks approximately 21-25 drinks per week - 3 to 4 glasses of wine per day - CIWA protocol  # Normocytic anemia- anemia panel, and CBC in the a.m.,  check H. pylori  # Hyperlipidemia-atorvastatin 10 mg nightly has not been resumed  # Hypertension- resumed home hydrochlorothiazide 12.5 mg p.o. daily, metoprolol succinate 25 mg p.o. daily  # Oxybutynin 5 mg nightly  A.m. labs: CBC, BMP, hepatic function panel  As needed  medications: Ibuprofen, Toradol, morphine, ondansetron  Chart reviewed.   DVT prophylaxis: Enoxaparin 40 mg subcutaneous every 24 hours, TED hose Code Status: Full code Diet: Heart healthy Family Communication: Updated daughter at bedside Disposition Plan: Pending clinical course Consults called: GI Admission status: MedSurg, observation, with telemetry  Past Medical History:  Diagnosis Date  . Hypertension   . Wears dentures    full upper, partial lower   Past Surgical History:  Procedure Laterality Date  . CESAREAN SECTION    . COLONOSCOPY  2012   cleared for 5 yrs- [place]  . COLONOSCOPY WITH PROPOFOL N/A 11/02/2016   Procedure: COLONOSCOPY WITH PROPOFOL;  Surgeon: Toney Reil, MD;  Location: [facility];  Service: Gastroenterology;  Laterality: N/A;   Social History:  reports that she has been smoking cigarettes. She has been smoking about 0.00 packs per day. She has never used smokeless tobacco. She reports current alcohol use of about 3.0 standard drinks of alcohol per week. She reports that she does not use drugs.  No Known Allergies Family History  Problem Relation Age of Onset  . Diabetes Mother   . Breast cancer Neg Hx    Family history: Family history reviewed and not pertinent  Prior to Admission medications   Medication Sig Start Date End Date Taking? Authorizing Provider  albuterol (VENTOLIN HFA) 108 (90 Base) MCG/ACT inhaler Inhale 2 puffs into the lungs every 6 (six) hours as needed for wheezing or shortness of breath. 05/30/18   Duanne Limerick, MD  atorvastatin (LIPITOR) 10 MG tablet Take 1 tablet (10 mg total) by mouth daily. 04/08/20   Duanne Limerick, MD  B Complex Vitamins (B COMPLEX PO) Take by mouth daily.    [provider]  Biotin 10 MG TABS Take 1 tablet by mouth daily.    [provider]  carbamide peroxide (DEBROX) 6.5 % OTIC solution Place 5 drops into both ears 2 (two) times daily. 02/27/19   Duanne Limerick, MD   cholecalciferol (VITAMIN D) 1000 UNITS tablet Take 1,000 Units by mouth daily.    [provider]  diclofenac Sodium (VOLTAREN) 1 % GEL Apply 2 g topically 4 (four) times daily. 03/19/20   Duanne Limerick, MD  etodolac (LODINE) 200 MG capsule Take 1 capsule (200 mg total) by mouth 2 (two) times daily. 06/09/18   Duanne Limerick, MD  fluticasone furoate-vilanterol (BREO ELLIPTA) 100-25 MCG/INH AEPB Inhale 1 puff into the lungs in the morning. 02/04/20   Duanne Limerick, MD  hydrochlorothiazide (HYDRODIURIL) 12.5 MG tablet Take 1 tablet (12.5 mg total) by mouth daily. 04/08/20   Duanne Limerick, MD  metoprolol succinate (TOPROL-XL) 25 MG 24 hr tablet Take 1 tablet (25 mg total) by mouth daily. 04/08/20   Duanne Limerick, MD  Multiple Vitamins-Minerals (MULTIVITAMIN WOMEN 50+ PO) Take 1 tablet by mouth daily.    [provider]  Omega-3 Fatty Acids (FISH OIL) 1000 MG CAPS Take 1 capsule (1,000 mg total) by mouth daily. 04/08/20   Duanne Limerick, MD  oxybutynin (DITROPAN-XL) 5 MG 24 hr tablet Take 1 tablet (5 mg total) by mouth at bedtime. 08/24/19   Duanne Limerick, MD  triamcinolone ointment (KENALOG) 0.5 %  Apply 1 application topically 2 (two) times daily. 05/30/18   Duanne LimerickJones, Deanna C, MD  vitamin C (ASCORBIC ACID) 250 MG tablet Take 250 mg by mouth daily.    [provider]   Physical Exam: Vitals:   06/03/20 1256 06/03/20 1257 06/03/20 1901 06/03/20 2034  BP: 124/62  (!) 159/61 124/61  Pulse: 74  65 71  Resp: 18  16 16   Temp:  98 F (36.7 C) (!) 97.5 F (36.4 C) (!) 97.5 F (36.4 C)  TempSrc: Oral  Oral Oral  SpO2: 97%  100% 100%  Weight:  60.8 kg    Height:  5\' 1"  (1.549 m)     Constitutional: appears age-appropriate, NAD, calm, comfortable Eyes: PERRL, lids and conjunctivae normal ENMT: Mucous membranes are moist. Posterior pharynx clear of any exudate or lesions. Age-appropriate dentition. Hearing appropriate Neck: normal, supple, no masses, no  thyromegaly Respiratory: clear to auscultation bilaterally, no wheezing, no crackles. Normal respiratory effort. No accessory muscle use.  Cardiovascular: Regular rate and rhythm, no murmurs / rubs / gallops. No extremity edema. 2+ pedal pulses. No carotid bruits.  Abdomen: Right upper quadrant tenderness, no masses palpated, no hepatosplenomegaly. Bowel sounds positive.  Musculoskeletal: no clubbing / cyanosis. No joint deformity upper and lower extremities. Good ROM, no contractures, no atrophy. Normal muscle tone.  Skin: no rashes, lesions, ulcers. No induration Neurologic: Sensation intact. Strength 5/5 in all 4.  Psychiatric: Normal judgment and insight. Alert and oriented x 3. Normal mood.   EKG: independently reviewed, showing sinus rhythm with rate of 77, QTc 442  Chest x-ray on Admission: I personally reviewed and I agree with radiologist reading as below.  CT ABDOMEN PELVIS W CONTRAST  Result Date: 06/03/2020 CLINICAL DATA:  Epigastric pain over the last several days. Bowel obstruction suspected. EXAM: CT ABDOMEN AND PELVIS WITH CONTRAST TECHNIQUE: Multidetector CT imaging of the abdomen and pelvis was performed using the standard protocol following bolus administration of intravenous contrast. CONTRAST:  100mL OMNIPAQUE IOHEXOL 300 MG/ML  SOLN COMPARISON:  None. FINDINGS: Lower chest: Normal Hepatobiliary: The liver has a normal appearance without focal lesions or intrahepatic biliary ductal dilatation. Common bile duct is somewhat prominent, measuring up to 9 mm in diameter, but I do not see an obstructing lesion in the distal duct or ampullary region. No calcified gallstones. Pancreas: Normal Spleen: Normal Adrenals/Urinary Tract: Adrenal glands are normal. Kidneys are normal. Bladder is normal. Stomach/Bowel: Stomach and small intestine are normal. The appendix is normal. No colon pathology is evident. Amount of fecal matter within normal limits. Diverticulosis of the left colon without  evidence of diverticulitis. Vascular/Lymphatic: Aortic atherosclerosis. No aneurysm. IVC is normal. No retroperitoneal adenopathy. Reproductive: Normal.  No pelvic mass. Other: No free fluid or air.  No hernia. Musculoskeletal: Normal.  No significant degenerative changes. IMPRESSION: 1. No acute finding to explain the clinical presentation. No evidence of bowel obstruction or other bowel pathology. Diverticulosis of the left colon without evidence of diverticulitis. 2. Aortic atherosclerosis. 3. Common bile duct is somewhat prominent, measuring up to 9 mm in diameter, but I do not see an obstructing lesion in the distal duct or ampullary region. Aortic Atherosclerosis (ICD10-I70.0). Electronically Signed   By: Paulina FusiMark  Shogry M.D.   On: 06/03/2020 14:39   MR ABDOMEN MRCP WO CONTRAST  Result Date: 06/03/2020 CLINICAL DATA:  Epigastric pain for several days. Biliary ductal dilatation on recent CT. EXAM: MRI ABDOMEN WITHOUT CONTRAST  (INCLUDING MRCP) TECHNIQUE: Multiplanar multisequence MR imaging of the abdomen was performed.  Heavily T2-weighted images of the biliary and pancreatic ducts were obtained, and three-dimensional MRCP images were rendered by post processing. COMPARISON:  CT on 06/03/2020 and ultrasound on 10/09/2013 FINDINGS: Lower chest: No acute findings. Hepatobiliary: No masses visualized on this unenhanced exam. Gallbladder is unremarkable. Mild diffuse biliary ductal dilatation is seen with common bile duct measuring 11 mm. No evidence of choledocholithiasis or biliary stricture. Pancreas: No mass or inflammatory process visualized on this unenhanced exam. No evidence of pancreatic ductal dilatation or pancreas divisum. Spleen:  Within normal limits in size. Adrenals/Urinary tract: Unremarkable. No evidence of nephrolithiasis or hydronephrosis. Stomach/Bowel: Visualized portion unremarkable. Vascular/Lymphatic: No pathologically enlarged lymph nodes identified. No evidence of abdominal aortic  aneurysm. Other:  None. Musculoskeletal:  No suspicious bone lesions identified. IMPRESSION: Mild diffuse biliary ductal dilatation, with common bile duct measuring 11 mm. No radiographic evidence of choledocholithiasis or other obstructing etiology. Electronically Signed   By: Danae Orleans M.D.   On: 06/03/2020 18:55   MR 3D Recon At Scanner  Result Date: 06/03/2020 CLINICAL DATA:  Epigastric pain for several days. Biliary ductal dilatation on recent CT. EXAM: MRI ABDOMEN WITHOUT CONTRAST  (INCLUDING MRCP) TECHNIQUE: Multiplanar multisequence MR imaging of the abdomen was performed. Heavily T2-weighted images of the biliary and pancreatic ducts were obtained, and three-dimensional MRCP images were rendered by post processing. COMPARISON:  CT on 06/03/2020 and ultrasound on 10/09/2013 FINDINGS: Lower chest: No acute findings. Hepatobiliary: No masses visualized on this unenhanced exam. Gallbladder is unremarkable. Mild diffuse biliary ductal dilatation is seen with common bile duct measuring 11 mm. No evidence of choledocholithiasis or biliary stricture. Pancreas: No mass or inflammatory process visualized on this unenhanced exam. No evidence of pancreatic ductal dilatation or pancreas divisum. Spleen:  Within normal limits in size. Adrenals/Urinary tract: Unremarkable. No evidence of nephrolithiasis or hydronephrosis. Stomach/Bowel: Visualized portion unremarkable. Vascular/Lymphatic: No pathologically enlarged lymph nodes identified. No evidence of abdominal aortic aneurysm. Other:  None. Musculoskeletal:  No suspicious bone lesions identified. IMPRESSION: Mild diffuse biliary ductal dilatation, with common bile duct measuring 11 mm. No radiographic evidence of choledocholithiasis or other obstructing etiology. Electronically Signed   By: Danae Orleans M.D.   On: 06/03/2020 18:55   Labs on Admission: I have personally reviewed following labs  CBC: Recent Labs  Lab 06/03/20 1318  WBC 5.4  HGB 10.6*   HCT 32.7*  MCV 91.6  PLT 253   Basic Metabolic Panel: Recent Labs  Lab 06/03/20 1318  NA 136  K 3.2*  CL 99  CO2 24  GLUCOSE 111*  BUN 22  CREATININE 0.86  CALCIUM 9.2   GFR: Estimated Creatinine Clearance: 57.5 mL/min (by C-G formula based on SCr of 0.86 mg/dL).  Liver Function Tests: Recent Labs  Lab 06/03/20 1318  AST 296*  ALT 88*  ALKPHOS 130*  BILITOT 1.1  PROT 7.4  ALBUMIN 4.0   Recent Labs  Lab 06/03/20 1318  LIPASE 33   Coagulation Profile: Recent Labs  Lab 06/03/20 1310  INR 1.0   Urine analysis:    Component Value Date/Time   COLORURINE YELLOW (A) 06/03/2020 1318   APPEARANCEUR CLEAR (A) 06/03/2020 1318   LABSPEC 1.016 06/03/2020 1318   PHURINE 5.0 06/03/2020 1318   GLUCOSEU NEGATIVE 06/03/2020 1318   HGBUR NEGATIVE 06/03/2020 1318   BILIRUBINUR NEGATIVE 06/03/2020 1318   BILIRUBINUR negative 09/13/2016 1615   KETONESUR 5 (A) 06/03/2020 1318   PROTEINUR NEGATIVE 06/03/2020 1318   UROBILINOGEN 0.2 09/13/2016 1615   NITRITE NEGATIVE 06/03/2020 1318  LEUKOCYTESUR NEGATIVE 06/03/2020 1318   Samantha Irwin N Latara Micheli D.O. [facility]  If 7PM-7AM, please contact overnight-coverage provider If 7AM-7PM, please contact day coverage provider [email or web address]  06/03/2020, 9:26 PM

## 2020-06-03 NOTE — ED Triage Notes (Signed)
First Nurse Note:  Arrives from [facility].  C/O epigastric pain.  VS wnl.

## 2020-06-03 NOTE — ED Provider Notes (Signed)
[facility] Emergency Department Provider Note    Event Date/Time   First MD Initiated Contact with Patient 06/03/20 1322     (approximate)  I have reviewed the triage vital signs and the nursing notes.   HISTORY  Chief Complaint Abdominal Pain    HPI Samantha Irwin is a 60 y.o. female no significant past medical history presents to the ER for evaluation of mild to moderate midepigastric sudden onset pain no vomiting did have some mild nausea with the discomfort.  No diarrhea.  Is never had pain like this before.  Was otherwise feeling well this morning.  No previous abdominal surgeries other than a C-section many years ago.  Denies any dysuria.  No chest pain or shortness of breath.  No history of pancreatitis or biliary disease.    Past Medical History:  Diagnosis Date  . Hypertension   . Wears dentures    full upper, partial lower   Family History  Problem Relation Age of Onset  . Diabetes Mother   . Breast cancer Neg Hx    Past Surgical History:  Procedure Laterality Date  . CESAREAN SECTION    . COLONOSCOPY  2012   cleared for 5 yrs- [place]  . COLONOSCOPY WITH PROPOFOL N/A 11/02/2016   Procedure: COLONOSCOPY WITH PROPOFOL;  Surgeon: Toney Reil, MD;  Location: [facility];  Service: Gastroenterology;  Laterality: N/A;   Patient Active Problem List   Diagnosis Date Noted  . Overactive bladder 05/30/2018  . Chronic obstructive pulmonary disease (HCC) 05/30/2018  . Familial hypercholesterolemia 05/30/2018  . Flexural eczema 05/30/2018  . Cigarette nicotine dependence without complication 05/30/2018  . Atherosclerosis of aorta (HCC) 05/12/2017  . Screening for colon cancer   . Essential hypertension 06/12/2015  . Annual physical exam 08/05/2014      Prior to Admission medications   Medication Sig Start Date End Date Taking? Authorizing Provider  albuterol (VENTOLIN HFA) 108 (90 Base) MCG/ACT inhaler Inhale 2  puffs into the lungs every 6 (six) hours as needed for wheezing or shortness of breath. 05/30/18   Duanne Limerick, MD  atorvastatin (LIPITOR) 10 MG tablet Take 1 tablet (10 mg total) by mouth daily. 04/08/20   Duanne Limerick, MD  B Complex Vitamins (B COMPLEX PO) Take by mouth daily.    [provider]  Biotin 10 MG TABS Take 1 tablet by mouth daily.    [provider]  carbamide peroxide (DEBROX) 6.5 % OTIC solution Place 5 drops into both ears 2 (two) times daily. 02/27/19   Duanne Limerick, MD  cholecalciferol (VITAMIN D) 1000 UNITS tablet Take 1,000 Units by mouth daily.    [provider]  diclofenac Sodium (VOLTAREN) 1 % GEL Apply 2 g topically 4 (four) times daily. 03/19/20   Duanne Limerick, MD  etodolac (LODINE) 200 MG capsule Take 1 capsule (200 mg total) by mouth 2 (two) times daily. 06/09/18   Duanne Limerick, MD  fluticasone furoate-vilanterol (BREO ELLIPTA) 100-25 MCG/INH AEPB Inhale 1 puff into the lungs in the morning. 02/04/20   Duanne Limerick, MD  hydrochlorothiazide (HYDRODIURIL) 12.5 MG tablet Take 1 tablet (12.5 mg total) by mouth daily. 04/08/20   Duanne Limerick, MD  metoprolol succinate (TOPROL-XL) 25 MG 24 hr tablet Take 1 tablet (25 mg total) by mouth daily. 04/08/20   Duanne Limerick, MD  Multiple Vitamins-Minerals (MULTIVITAMIN WOMEN 50+ PO) Take 1 tablet by mouth daily.    [provider]  Omega-3 Fatty Acids (FISH OIL) 1000 MG CAPS Take 1 capsule (1,000 mg total) by mouth daily. 04/08/20   Duanne Limerick, MD  oxybutynin (DITROPAN-XL) 5 MG 24 hr tablet Take 1 tablet (5 mg total) by mouth at bedtime. 08/24/19   Duanne Limerick, MD  triamcinolone ointment (KENALOG) 0.5 % Apply 1 application topically 2 (two) times daily. 05/30/18   Duanne Limerick, MD  vitamin C (ASCORBIC ACID) 250 MG tablet Take 250 mg by mouth daily.    [provider]    Allergies Patient has no known allergies.    Social History Social History   Tobacco Use  .  Smoking status: Current Every Day Smoker    Packs/day: 0.00    Types: Cigarettes  . Smokeless tobacco: Never Used  . Tobacco comment: gave info on patches and pills  Vaping Use  . Vaping Use: Never used  Substance Use Topics  . Alcohol use: Yes    Alcohol/week: 3.0 standard drinks    Types: 3 Glasses of wine per week  . Drug use: No    Review of Systems Patient denies headaches, rhinorrhea, blurry vision, numbness, shortness of breath, chest pain, edema, cough, abdominal pain, nausea, vomiting, diarrhea, dysuria, fevers, rashes or hallucinations unless otherwise stated above in HPI. ____________________________________________   PHYSICAL EXAM:  VITAL SIGNS: Vitals:   06/03/20 1256 06/03/20 1257  BP: 124/62   Pulse: 74   Resp: 18   Temp:  98 F (36.7 C)  SpO2: 97%     Constitutional: Alert and oriented.  Eyes: Conjunctivae are normal.  Head: Atraumatic. Nose: No congestion/rhinnorhea. Mouth/Throat: Mucous membranes are moist.   Neck: No stridor. Painless ROM.  Cardiovascular: Normal rate, regular rhythm. Grossly normal heart sounds.  Good peripheral circulation. Respiratory: Normal respiratory effort.  No retractions. Lungs CTAB. Gastrointestinal: Soft with mild ttp. No distention. No abdominal bruits. No CVA tenderness. Genitourinary:  Musculoskeletal: No lower extremity tenderness nor edema.  No joint effusions. Neurologic:  Normal speech and language. No gross focal neurologic deficits are appreciated. No facial droop Skin:  Skin is warm, dry and intact. No rash noted. Psychiatric: Mood and affect are normal. Speech and behavior are normal.  ____________________________________________   LABS (all labs ordered are listed, but only abnormal results are displayed)  Results for orders placed or performed during the hospital encounter of 06/03/20 (from the past 24 hour(s))  Lipase, blood     Status: None   Collection Time: 06/03/20  1:18 PM  Result Value Ref Range    Lipase 33 11 - 51 U/L  Comprehensive metabolic panel     Status: Abnormal   Collection Time: 06/03/20  1:18 PM  Result Value Ref Range   Sodium 136 135 - 145 mmol/L   Potassium 3.2 (L) 3.5 - 5.1 mmol/L   Chloride 99 98 - 111 mmol/L   CO2 24 22 - 32 mmol/L   Glucose, Bld 111 (H) 70 - 99 mg/dL   BUN 22 8 - 23 mg/dL   Creatinine, Ser 4.49 0.44 - 1.00 mg/dL   Calcium 9.2 8.9 - 67.5 mg/dL   Total Protein 7.4 6.5 - 8.1 g/dL   Albumin 4.0 3.5 - 5.0 g/dL   AST 916 (H) 15 - 41 U/L   ALT 88 (H) 0 - 44 U/L   Alkaline Phosphatase 130 (H) 38 - 126 U/L   Total Bilirubin 1.1 0.3 - 1.2 mg/dL   GFR, Estimated >38 >46 mL/min   Anion gap  13 5 - 15  CBC     Status: Abnormal   Collection Time: 06/03/20  1:18 PM  Result Value Ref Range   WBC 5.4 4.0 - 10.5 K/uL   RBC 3.57 (L) 3.87 - 5.11 MIL/uL   Hemoglobin 10.6 (L) 12.0 - 15.0 g/dL   HCT 95.232.7 (L) 84.136.0 - 32.446.0 %   MCV 91.6 80.0 - 100.0 fL   MCH 29.7 26.0 - 34.0 pg   MCHC 32.4 30.0 - 36.0 g/dL   RDW 40.111.5 02.711.5 - 25.315.5 %   Platelets 253 150 - 400 K/uL   nRBC 0.0 0.0 - 0.2 %  Urinalysis, Complete w Microscopic     Status: Abnormal   Collection Time: 06/03/20  1:18 PM  Result Value Ref Range   Color, Urine YELLOW (A) YELLOW   APPearance CLEAR (A) CLEAR   Specific Gravity, Urine 1.016 1.005 - 1.030   pH 5.0 5.0 - 8.0   Glucose, UA NEGATIVE NEGATIVE mg/dL   Hgb urine dipstick NEGATIVE NEGATIVE   Bilirubin Urine NEGATIVE NEGATIVE   Ketones, ur 5 (A) NEGATIVE mg/dL   Protein, ur NEGATIVE NEGATIVE mg/dL   Nitrite NEGATIVE NEGATIVE   Leukocytes,Ua NEGATIVE NEGATIVE   RBC / HPF 0-5 0 - 5 RBC/hpf   WBC, UA 0-5 0 - 5 WBC/hpf   Bacteria, UA NONE SEEN NONE SEEN   Squamous Epithelial / LPF 0-5 0 - 5   Mucus PRESENT    ____________________________________________  EKG My review and personal interpretation at Time: 13:10   Indication: epigastric pain  Rate: 75  Rhythm: sinus Axis: normal Other: normal intervals, no  stemi ____________________________________________  RADIOLOGY  I personally reviewed all radiographic images ordered to evaluate for the above acute complaints and reviewed radiology reports and findings.  These findings were personally discussed with the patient.  Please see medical record for radiology report.  ____________________________________________   PROCEDURES  Procedure(s) performed:  Procedures    Critical Care performed: no ____________________________________________   INITIAL IMPRESSION / ASSESSMENT AND PLAN / ED COURSE  Pertinent labs & imaging results that were available during my care of the patient were reviewed by me and considered in my medical decision making (see chart for details).   DDX: Enteritis, gastritis, pancreatitis, SBO, appendicitis, IBD, biliary pathology, hepatitis  Samantha Irwin is a 60 y.o. who presents to the ED with presentation as described above.  Patient does have mild epigastric pain on exam.  Afebrile no white count but symptoms just started.  Given age risk factors CT imaging ordered for above differential.  Clinical Course as of 06/03/20 1520  Tue Jun 03, 2020  1448 Common bile duct is prominent given her elevated AST and ALT will consult GI. [PR]  1507 I discussed the case in consultation with GI.  They are recommending MRCP to further evaluate.  She does have mild pain on the right upper quadrant but no fever or white count.  Given her LFT elevation recommending observation in the hospital for MRCP and serial LFTs.  Will give IV hydration will discuss with hospitalist. [PR]    Clinical Course User Index [PR] Willy Eddyobinson, Balraj Brayfield, MD    The patient was evaluated in Emergency Department today for the symptoms described in the history of present illness. He/she was evaluated in the context of the global COVID-19 pandemic, which necessitated consideration that the patient might be at risk for infection with the SARS-CoV-2 virus  that causes COVID-19. Institutional protocols and algorithms that pertain to the evaluation  of patients at risk for COVID-19 are in a state of rapid change based on information released by regulatory bodies including the CDC and federal and state organizations. These policies and algorithms were followed during the patient's care in the ED.  As part of my medical decision making, I reviewed the following data within the electronic MEDICAL RECORD NUMBER Nursing notes reviewed and incorporated, Labs reviewed, notes from prior ED visits and [place] Controlled Substance Database   ____________________________________________   FINAL CLINICAL IMPRESSION(S) / ED DIAGNOSES  Final diagnoses:  Epigastric pain  Elevated LFTs      NEW MEDICATIONS STARTED DURING THIS VISIT:  New Prescriptions   No medications on file     Note:  This document was prepared using Dragon voice recognition software and may include unintentional dictation errors.    Willy Eddy, MD 06/03/20 1520

## 2020-06-04 DIAGNOSIS — R7989 Other specified abnormal findings of blood chemistry: Secondary | ICD-10-CM | POA: Diagnosis not present

## 2020-06-04 DIAGNOSIS — I10 Essential (primary) hypertension: Secondary | ICD-10-CM | POA: Diagnosis not present

## 2020-06-04 DIAGNOSIS — R1013 Epigastric pain: Secondary | ICD-10-CM | POA: Diagnosis not present

## 2020-06-04 DIAGNOSIS — R109 Unspecified abdominal pain: Secondary | ICD-10-CM

## 2020-06-04 DIAGNOSIS — F1010 Alcohol abuse, uncomplicated: Secondary | ICD-10-CM | POA: Diagnosis not present

## 2020-06-04 DIAGNOSIS — E7801 Familial hypercholesterolemia: Secondary | ICD-10-CM

## 2020-06-04 DIAGNOSIS — E876 Hypokalemia: Secondary | ICD-10-CM | POA: Diagnosis present

## 2020-06-04 DIAGNOSIS — K838 Other specified diseases of biliary tract: Secondary | ICD-10-CM | POA: Diagnosis not present

## 2020-06-04 LAB — BASIC METABOLIC PANEL
Anion gap: 7 (ref 5–15)
BUN: 23 mg/dL (ref 8–23)
CO2: 28 mmol/L (ref 22–32)
Calcium: 8.9 mg/dL (ref 8.9–10.3)
Chloride: 104 mmol/L (ref 98–111)
Creatinine, Ser: 0.94 mg/dL (ref 0.44–1.00)
GFR, Estimated: >60 mL/min (ref >60)
Glucose, Bld: 88 mg/dL (ref 70–99)
Potassium: 3.4 mmol/L — ABNORMAL LOW (ref 3.5–5.1)
Sodium: 139 mmol/L (ref 135–145)

## 2020-06-04 LAB — RETICULOCYTES
Immature Retic Fract: 10.8 % (ref 2.3–15.9)
RBC.: 3.27 MIL/uL — ABNORMAL LOW (ref 3.87–5.11)
Retic Count, Absolute: 58.2 10*3/uL (ref 19.0–186.0)
Retic Ct Pct: 1.8 % (ref 0.4–3.1)

## 2020-06-04 LAB — CBC
HCT: 30.3 % — ABNORMAL LOW (ref 36.0–46.0)
Hemoglobin: 10.1 g/dL — ABNORMAL LOW (ref 12.0–15.0)
MCH: 30.3 pg (ref 26.0–34.0)
MCHC: 33.3 g/dL (ref 30.0–36.0)
MCV: 91 fL (ref 80.0–100.0)
Platelets: 221 10*3/uL (ref 150–400)
RBC: 3.33 MIL/uL — ABNORMAL LOW (ref 3.87–5.11)
RDW: 11.5 % (ref 11.5–15.5)
WBC: 3.3 10*3/uL — ABNORMAL LOW (ref 4.0–10.5)
nRBC: 0 % (ref 0.0–0.2)

## 2020-06-04 LAB — HEPATIC FUNCTION PANEL
ALT: 142 U/L — ABNORMAL HIGH (ref 0–44)
AST: 144 U/L — ABNORMAL HIGH (ref 15–41)
Albumin: 3.5 g/dL (ref 3.5–5.0)
Alkaline Phosphatase: 155 U/L — ABNORMAL HIGH (ref 38–126)
Bilirubin, Direct: <0.1 mg/dL (ref 0.0–0.2)
Total Bilirubin: 0.9 mg/dL (ref 0.3–1.2)
Total Protein: 6.5 g/dL (ref 6.5–8.1)

## 2020-06-04 LAB — FOLATE: Folate: 28 ng/mL (ref >5.9)

## 2020-06-04 LAB — HIV ANTIBODY (ROUTINE TESTING W REFLEX): HIV Screen 4th Generation wRfx: NONREACTIVE

## 2020-06-04 LAB — VITAMIN B12: Vitamin B-12: 469 pg/mL (ref 180–914)

## 2020-06-04 LAB — HEPATITIS PANEL, ACUTE
HCV Ab: NONREACTIVE
Hep A IgM: NONREACTIVE
Hep B C IgM: NONREACTIVE
Hepatitis B Surface Ag: NONREACTIVE

## 2020-06-04 LAB — SARS CORONAVIRUS 2 (TAT 6-24 HRS): SARS Coronavirus 2: NEGATIVE

## 2020-06-04 LAB — MAGNESIUM: Magnesium: 1.9 mg/dL (ref 1.7–2.4)

## 2020-06-04 LAB — FERRITIN: Ferritin: 185 ng/mL (ref 11–307)

## 2020-06-04 LAB — IRON AND TIBC
Iron: 161 ug/dL (ref 28–170)
Saturation Ratios: 44 % — ABNORMAL HIGH (ref 10.4–31.8)
TIBC: 365 ug/dL (ref 250–450)
UIBC: 204 ug/dL

## 2020-06-04 MED ORDER — MELATONIN 5 MG PO TABS
5.0000 mg | ORAL_TABLET | Freq: Every evening | ORAL | Status: DC | PRN
  Administered 2020-06-04: 5 mg via ORAL
  Filled 2020-06-04 (×2): qty 1

## 2020-06-04 MED ORDER — POTASSIUM CHLORIDE CRYS ER 20 MEQ PO TBCR
40.0000 meq | EXTENDED_RELEASE_TABLET | Freq: Once | ORAL | Status: AC
  Administered 2020-06-04: 40 meq via ORAL
  Filled 2020-06-04: qty 2

## 2020-06-04 MED ORDER — SODIUM CHLORIDE 0.9 % IV SOLN: INTRAVENOUS | Status: DC

## 2020-06-04 MED ORDER — PANTOPRAZOLE SODIUM 40 MG PO TBEC
40.0000 mg | DELAYED_RELEASE_TABLET | Freq: Every day | ORAL | Status: DC
  Administered 2020-06-04: 40 mg via ORAL
  Filled 2020-06-04: qty 1

## 2020-06-04 MED ORDER — PANTOPRAZOLE SODIUM 40 MG PO TBEC
40.0000 mg | DELAYED_RELEASE_TABLET | Freq: Two times a day (BID) | ORAL | Status: DC
  Administered 2020-06-04 – 2020-06-05 (×3): 40 mg via ORAL
  Filled 2020-06-04 (×3): qty 1

## 2020-06-04 NOTE — Progress Notes (Signed)
PROGRESS NOTE    Samantha Irwin  XVQ:[id number] DOB: 04/16/1960 DOA: 06/03/2020 PCP: Duanne Limerick, MD    Chief Complaint  Patient presents with  . Abdominal Pain    Brief Narrative:  Patient 60 year old female history of hyperlipidemia, hypertension, daily alcohol use, tobacco use, postmenopausal presented to the ED with concerns of abdominal pain.  Patient noted to have a transaminitis.  CT abdomen and pelvis was done.  MRCP done.  GI consulted for further evaluation and management.   Assessment & Plan:   Principal Problem:   Elevated LFTs Active Problems:   Essential hypertension   Chronic obstructive pulmonary disease (HCC)   Familial hypercholesterolemia   Cigarette nicotine dependence without complication   Alcohol abuse   Abdominal pain   Hypokalemia  #1 abdominal pain/transaminitis -Patient presented with abdominal pain noted to have a transaminitis with a AST to ALT ratio of 221. -Patient with history of daily alcohol use, currently on statin. -Patient with some clinical improvement however still with some diffuse abdominal discomfort. -CT abdomen and pelvis with no evidence of bowel obstruction or bowel pathology, diverticulosis without evidence of diverticulitis, somewhat prominent common bile duct measuring up to 9 mm in diameter but no obstructing lesion in the distal duct or ampullary region noted. -MRCP done with mild diffuse biliary ductal dilatation with CBD measuring 11 mm, no radiographic evidence of choledocholithiasis or other obstructing etiology. -LFTs trending down. -Acute hepatitis panel negative. -Patient started on PPI which will increase to twice daily per GI recommendations. -Patient seen in consultation by GI who are recommending EGD to be done tomorrow to rule out erosive esophagitis, alcohol induced gastritis, peptic ulcer disease. -Appreciate GI input and recommendations.  2.  Alcohol abuse -Patient states drinks about 3 to 4 glasses  of wine per day. -Patient with no signs of withdrawal. -Alcohol cessation stressed to patient. -Continue the Ativan withdrawal protocol, folic acid, multivitamin, thiamine.  3.  Normocytic anemia -Patient with no overt bleeding. -Anemia panel consistent with anemia of chronic disease. -Vitamin B12 level at 469. -We will place on oral vitamin B12 supplementation on discharge.  4.  Hypokalemia -Replete.  5.  Hyperlipidemia -Hold statin secondary to transaminitis.  6.  Hypertension Continue HCTZ, metoprolol.  7.    DVT prophylaxis: Lovenox Code Status: Full Family Communication: Updated patient and daughters at bedside. Disposition:   Status is: Observation    Dispo: The patient is from: Home              Anticipated d/c is to: Home              Patient currently being worked up for abdominal pain, transaminitis, likely to undergo EGD and ERCP tomorrow.  Not stable for discharge.   Difficult to place patient no       Consultants:   Gastroenterology: Dr. Allegra Lai 06/03/2020  Procedures:   CT abdomen and pelvis 06/03/2020  MRCP 06/03/2020    Antimicrobials:   None   Subjective: States feeling better than on admission. Abdominal pain intermittent and some improvement. Tolerated diet with some abdominal pain afterwards transiently.  No tremors noted.  Patient denies any overt bleeding.  Objective: Vitals:   06/04/20 0443 06/04/20 0838 06/04/20 1123 06/04/20 1509  BP: 127/65 122/74 127/67 133/66  Pulse: 66 64 62 69  Resp: 16 17 16 15   Temp: (!) 97.5 F (36.4 C) 98 F (36.7 C) 98.7 F (37.1 C) 98.4 F (36.9 C)  TempSrc: Oral Oral Oral   SpO2: 99%  99% 100% 99%  Weight:      Height:        Intake/Output Summary (Last 24 hours) at 06/04/2020 1955 Last data filed at 06/04/2020 1355 Gross per 24 hour  Intake 818.76 ml  Output --  Net 818.76 ml   Filed Weights   06/03/20 1257  Weight: 60.8 kg    Examination:  General exam: Appears calm and  comfortable  Respiratory system: Clear to auscultation bilaterally, no wheezes, no crackles, no rhonchi. Respiratory effort normal. Cardiovascular system: S1 & S2 heard, RRR. No JVD, murmurs, rubs, gallops or clicks. No pedal edema. Gastrointestinal system: Abdomen is nondistended, soft and some abdominal discomfort midabdomen. No organomegaly or masses felt. Normal bowel sounds heard. Central nervous system: Alert and oriented. No focal neurological deficits. Extremities: Symmetric 5 x 5 power. Skin: No rashes, lesions or ulcers Psychiatry: Judgement and insight appear normal. Mood & affect appropriate.     Data Reviewed: I have personally reviewed following labs and imaging studies  CBC: Recent Labs  Lab 06/03/20 1318 06/04/20 0403  WBC 5.4 3.3*  HGB 10.6* 10.1*  HCT 32.7* 30.3*  MCV 91.6 91.0  PLT 253 221    Basic Metabolic Panel: Recent Labs  Lab 06/03/20 1318 06/03/20 2305 06/04/20 0403  NA 136 135 139  K 3.2* 3.2* 3.4*  CL 99 100 104  CO2 24 25 28   GLUCOSE 111* 143* 88  BUN 22 25* 23  CREATININE 0.86 1.22* 0.94  CALCIUM 9.2 8.8* 8.9  MG  --  1.7 1.9  PHOS  --  4.4  --     GFR: Estimated Creatinine Clearance: 52.6 mL/min (by C-G formula based on SCr of 0.94 mg/dL).  Liver Function Tests: Recent Labs  Lab 06/03/20 1318 06/03/20 2305 06/04/20 0403  AST 296* 288* 144*  ALT 88* 167* 142*  ALKPHOS 130* 158* 155*  BILITOT 1.1 1.3* 0.9  PROT 7.4 6.6 6.5  ALBUMIN 4.0 3.5 3.5    CBG: No results for input(s): GLUCAP in the last 168 hours.   Recent Results (from the past 240 hour(s))  SARS CORONAVIRUS 2 (TAT 6-24 HRS) Nasopharyngeal Nasopharyngeal Swab     Status: None   Collection Time: 06/03/20  4:08 PM   Specimen: Nasopharyngeal Swab  Result Value Ref Range Status   SARS Coronavirus 2 NEGATIVE NEGATIVE Final    Comment: (NOTE) SARS-CoV-2 target nucleic acids are NOT DETECTED.  The SARS-CoV-2 RNA is generally detectable in upper and  lower respiratory specimens during the acute phase of infection. Negative results do not preclude SARS-CoV-2 infection, do not rule out co-infections with other pathogens, and should not be used as the sole basis for treatment or other patient management decisions. Negative results must be combined with clinical observations, patient history, and epidemiological information. The expected result is Negative.  Fact Sheet for Patients: 06/05/20  Fact Sheet for Healthcare Providers: HairSlick.no  This test is not yet approved or cleared by the quierodirigir.com FDA and  has been authorized for detection and/or diagnosis of SARS-CoV-2 by FDA under an Emergency Use Authorization (EUA). This EUA will remain  in effect (meaning this test can be used) for the duration of the COVID-19 declaration under Se ction 564(b)(1) of the Act, 21 U.S.C. section 360bbb-3(b)(1), unless the authorization is terminated or revoked sooner.  Performed at [facility], [address], [place], [place]          Radiology Studies: CT ABDOMEN PELVIS W CONTRAST  Result Date:  06/03/2020 CLINICAL DATA:  Epigastric pain over the last several days. Bowel obstruction suspected. EXAM: CT ABDOMEN AND PELVIS WITH CONTRAST TECHNIQUE: Multidetector CT imaging of the abdomen and pelvis was performed using the standard protocol following bolus administration of intravenous contrast. CONTRAST:  OMNIPAQUE IOHEXOL 300 MG/ML  SOLN COMPARISON:  None. FINDINGS: Lower chest: Normal Hepatobiliary: The liver has a normal appearance without focal lesions or intrahepatic biliary ductal dilatation. Common bile duct is somewhat prominent, measuring up to 9 mm in diameter, but I do not see an obstructing lesion in the distal duct or ampullary region. No calcified gallstones. Pancreas: Normal Spleen: Normal Adrenals/Urinary Tract: Adrenal glands are normal.  Kidneys are normal. Bladder is normal. Stomach/Bowel: Stomach and small intestine are normal. The appendix is normal. No colon pathology is evident. Amount of fecal matter within normal limits. Diverticulosis of the left colon without evidence of diverticulitis. Vascular/Lymphatic: Aortic atherosclerosis. No aneurysm. IVC is normal. No retroperitoneal adenopathy. Reproductive: Normal.  No pelvic mass. Other: No free fluid or air.  No hernia. Musculoskeletal: Normal.  No significant degenerative changes. IMPRESSION: 1. No acute finding to explain the clinical presentation. No evidence of bowel obstruction or other bowel pathology. Diverticulosis of the left colon without evidence of diverticulitis. 2. Aortic atherosclerosis. 3. Common bile duct is somewhat prominent, measuring up to 9 mm in diameter, but I do not see an obstructing lesion in the distal duct or ampullary region. Aortic Atherosclerosis (ICD10-I70.0). Electronically Signed   By: Paulina Fusi M.D.   On: 06/03/2020 14:39   MR ABDOMEN MRCP WO CONTRAST  Result Date: 06/03/2020 CLINICAL DATA:  Epigastric pain for several days. Biliary ductal dilatation on recent CT. EXAM: MRI ABDOMEN WITHOUT CONTRAST  (INCLUDING MRCP) TECHNIQUE: Multiplanar multisequence MR imaging of the abdomen was performed. Heavily T2-weighted images of the biliary and pancreatic ducts were obtained, and three-dimensional MRCP images were rendered by post processing. COMPARISON:  CT on 06/03/2020 and ultrasound on 10/09/2013 FINDINGS: Lower chest: No acute findings. Hepatobiliary: No masses visualized on this unenhanced exam. Gallbladder is unremarkable. Mild diffuse biliary ductal dilatation is seen with common bile duct measuring 11 mm. No evidence of choledocholithiasis or biliary stricture. Pancreas: No mass or inflammatory process visualized on this unenhanced exam. No evidence of pancreatic ductal dilatation or pancreas divisum. Spleen:  Within normal limits in size.  Adrenals/Urinary tract: Unremarkable. No evidence of nephrolithiasis or hydronephrosis. Stomach/Bowel: Visualized portion unremarkable. Vascular/Lymphatic: No pathologically enlarged lymph nodes identified. No evidence of abdominal aortic aneurysm. Other:  None. Musculoskeletal:  No suspicious bone lesions identified. IMPRESSION: Mild diffuse biliary ductal dilatation, with common bile duct measuring 11 mm. No radiographic evidence of choledocholithiasis or other obstructing etiology. Electronically Signed   By: Danae Orleans M.D.   On: 06/03/2020 18:55   MR 3D Recon At Scanner  Result Date: 06/03/2020 CLINICAL DATA:  Epigastric pain for several days. Biliary ductal dilatation on recent CT. EXAM: MRI ABDOMEN WITHOUT CONTRAST  (INCLUDING MRCP) TECHNIQUE: Multiplanar multisequence MR imaging of the abdomen was performed. Heavily T2-weighted images of the biliary and pancreatic ducts were obtained, and three-dimensional MRCP images were rendered by post processing. COMPARISON:  CT on 06/03/2020 and ultrasound on 10/09/2013 FINDINGS: Lower chest: No acute findings. Hepatobiliary: No masses visualized on this unenhanced exam. Gallbladder is unremarkable. Mild diffuse biliary ductal dilatation is seen with common bile duct measuring 11 mm. No evidence of choledocholithiasis or biliary stricture. Pancreas: No mass or inflammatory process visualized on this unenhanced exam. No evidence of pancreatic ductal dilatation  or pancreas divisum. Spleen:  Within normal limits in size. Adrenals/Urinary tract: Unremarkable. No evidence of nephrolithiasis or hydronephrosis. Stomach/Bowel: Visualized portion unremarkable. Vascular/Lymphatic: No pathologically enlarged lymph nodes identified. No evidence of abdominal aortic aneurysm. Other:  None. Musculoskeletal:  No suspicious bone lesions identified. IMPRESSION: Mild diffuse biliary ductal dilatation, with common bile duct measuring 11 mm. No radiographic evidence of  choledocholithiasis or other obstructing etiology. Electronically Signed   By: Danae OrleansJohn A Stahl M.D.   On: 06/03/2020 18:55        Scheduled Meds: . vitamin C  250 mg Oral Daily  . cholecalciferol  1,000 Units Oral Daily  . enoxaparin (LOVENOX) injection  40 mg Subcutaneous [id number]  . fluticasone furoate-vilanterol  1 puff Inhalation Daily  . folic acid  1 mg Oral Daily  . hydrochlorothiazide  12.5 mg Oral Daily  . metoprolol succinate  25 mg Oral Daily  . multivitamin with minerals  1 tablet Oral Daily  . oxybutynin  5 mg Oral QHS  . pantoprazole  40 mg Oral BID AC  . thiamine  100 mg Oral Daily   Or  . thiamine  100 mg Intravenous Daily   Continuous Infusions: . sodium chloride 20 mL/hr at 06/04/20 1709     LOS: 0 days    Time spent: 40 minutes    Ramiro Harvestaniel Jamica Woodyard, MD [facility]   To contact the attending provider between 7A-7P or the covering provider during after hours 7P-7A, please log into the web site [email or web address] and access using universal  password for that web site. If you do not have the password, please call the hospital operator.  06/04/2020, 7:55 PM

## 2020-06-04 NOTE — Progress Notes (Signed)
Samantha Repress, MD [address]  [address]  [place], [place] [address]  Main: [phone]  Fax: [phone] Pager: [phone]   Subjective: Patient reports mild epigastric discomfort.  She is able to tolerate solid food.  She underwent MRCP yesterday   Objective: Vital signs in last 24 hours: Vitals:   06/04/20 0021 06/04/20 0443 06/04/20 0838 06/04/20 1123  BP: 120/68 127/65 122/74 127/67  Pulse: 63 66 64 62  Resp: 14 16 17 16   Temp: (!) 97.5 F (36.4 C) (!) 97.5 F (36.4 C) 98 F (36.7 C) 98.7 F (37.1 C)  TempSrc: Oral Oral Oral Oral  SpO2: 100% 99% 99% 100%  Weight:      Height:       Weight change:   Intake/Output Summary (Last 24 hours) at 06/04/2020 1430 Last data filed at 06/04/2020 1355 Gross per 24 hour  Intake 932.63 ml  Output --  Net 932.63 ml     Exam: Heart:: Regular rate and rhythm, S1S2 present or without murmur or extra heart sounds Lungs: normal and clear to auscultation Abdomen: mild epigastric tenderness   Lab Results: CBC Latest Ref Rng & Units 06/04/2020 06/03/2020 01/26/2018  WBC 4.0 - 10.5 K/uL 3.3(L) 5.4 5.4  Hemoglobin 12.0 - 15.0 g/dL 10.1(L) 10.6(L) 13.5  Hematocrit 36.0 - 46.0 % 30.3(L) 32.7(L) 43.4  Platelets 150 - 400 K/uL 221 253 295   CMP Latest Ref Rng & Units 06/04/2020 06/03/2020 06/03/2020  Glucose 70 - 99 mg/dL 88 06/05/2020) 440(N)  BUN 8 - 23 mg/dL 23 027(O) 22  Creatinine 0.44 - 1.00 mg/dL 53(G 6.44) 0.34(V  Sodium 135 - 145 mmol/L 139 135 136  Potassium 3.5 - 5.1 mmol/L 3.4(L) 3.2(L) 3.2(L)  Chloride 98 - 111 mmol/L 104 100 99  CO2 22 - 32 mmol/L 28 25 24   Calcium 8.9 - 10.3 mg/dL 8.9 4.25) 9.2  Total Protein 6.5 - 8.1 g/dL 6.5 6.6 7.4  Total Bilirubin 0.3 - 1.2 mg/dL 0.9 ) 1.1  Alkaline Phos 38 - 126 U/L 155(H) 158(H) 130(H)  AST 15 - 41 U/L 144(H) 288(H) 296(H)  ALT 0 - 44 U/L 142(H) 167(H) 88(H)    Micro Results: Recent Results (from the past 240 hour(s))  SARS CORONAVIRUS 2 (TAT 6-24 HRS)  Nasopharyngeal Nasopharyngeal Swab     Status: None   Collection Time: 06/03/20  4:08 PM   Specimen: Nasopharyngeal Swab  Result Value Ref Range Status   SARS Coronavirus 2 NEGATIVE NEGATIVE Final    Comment: (NOTE) SARS-CoV-2 target nucleic acids are NOT DETECTED.  The SARS-CoV-2 RNA is generally detectable in upper and lower respiratory specimens during the acute phase of infection. Negative results do not preclude SARS-CoV-2 infection, do not rule out co-infections with other pathogens, and should not be used as the sole basis for treatment or other patient management decisions. Negative results must be combined with clinical observations, patient history, and epidemiological information. The expected result is Negative.  Fact Sheet for Patients: 3.8(V  Fact Sheet for Healthcare Providers: 06/05/20  This test is not yet approved or cleared by the [employer] FDA and  has been authorized for detection and/or diagnosis of SARS-CoV-2 by FDA under an Emergency Use Authorization (EUA). This EUA will remain  in effect (meaning this test can be used) for the duration of the COVID-19 declaration under Se ction 564(b)(1) of the Act, 21 U.S.C. section 360bbb-3(b)(1), unless the authorization is terminated or revoked sooner.  Performed at [facility],  [address], [place], [place] [address]    Studies/Results: CT ABDOMEN PELVIS W CONTRAST  Result Date: 06/03/2020 CLINICAL DATA:  Epigastric pain over the last several days. Bowel obstruction suspected. EXAM: CT ABDOMEN AND PELVIS WITH CONTRAST TECHNIQUE: Multidetector CT imaging of the abdomen and pelvis was performed using the standard protocol following bolus administration of intravenous contrast. CONTRAST:  OMNIPAQUE IOHEXOL 300 MG/ML  SOLN COMPARISON:  None. FINDINGS: Lower chest: Normal Hepatobiliary: The liver has a normal appearance without  focal lesions or intrahepatic biliary ductal dilatation. Common bile duct is somewhat prominent, measuring up to 9 mm in diameter, but I do not see an obstructing lesion in the distal duct or ampullary region. No calcified gallstones. Pancreas: Normal Spleen: Normal Adrenals/Urinary Tract: Adrenal glands are normal. Kidneys are normal. Bladder is normal. Stomach/Bowel: Stomach and small intestine are normal. The appendix is normal. No colon pathology is evident. Amount of fecal matter within normal limits. Diverticulosis of the left colon without evidence of diverticulitis. Vascular/Lymphatic: Aortic atherosclerosis. No aneurysm. IVC is normal. No retroperitoneal adenopathy. Reproductive: Normal.  No pelvic mass. Other: No free fluid or air.  No hernia. Musculoskeletal: Normal.  No significant degenerative changes. IMPRESSION: 1. No acute finding to explain the clinical presentation. No evidence of bowel obstruction or other bowel pathology. Diverticulosis of the left colon without evidence of diverticulitis. 2. Aortic atherosclerosis. 3. Common bile duct is somewhat prominent, measuring up to 9 mm in diameter, but I do not see an obstructing lesion in the distal duct or ampullary region. Aortic Atherosclerosis (ICD10-I70.0). Electronically Signed   By: Paulina Fusi M.D.   On: 06/03/2020 14:39   MR ABDOMEN MRCP WO CONTRAST  Result Date: 06/03/2020 CLINICAL DATA:  Epigastric pain for several days. Biliary ductal dilatation on recent CT. EXAM: MRI ABDOMEN WITHOUT CONTRAST  (INCLUDING MRCP) TECHNIQUE: Multiplanar multisequence MR imaging of the abdomen was performed. Heavily T2-weighted images of the biliary and pancreatic ducts were obtained, and three-dimensional MRCP images were rendered by post processing. COMPARISON:  CT on 06/03/2020 and ultrasound on 10/09/2013 FINDINGS: Lower chest: No acute findings. Hepatobiliary: No masses visualized on this unenhanced exam. Gallbladder is unremarkable. Mild diffuse  biliary ductal dilatation is seen with common bile duct measuring 11 mm. No evidence of choledocholithiasis or biliary stricture. Pancreas: No mass or inflammatory process visualized on this unenhanced exam. No evidence of pancreatic ductal dilatation or pancreas divisum. Spleen:  Within normal limits in size. Adrenals/Urinary tract: Unremarkable. No evidence of nephrolithiasis or hydronephrosis. Stomach/Bowel: Visualized portion unremarkable. Vascular/Lymphatic: No pathologically enlarged lymph nodes identified. No evidence of abdominal aortic aneurysm. Other:  None. Musculoskeletal:  No suspicious bone lesions identified. IMPRESSION: Mild diffuse biliary ductal dilatation, with common bile duct measuring 11 mm. No radiographic evidence of choledocholithiasis or other obstructing etiology. Electronically Signed   By: Danae Orleans M.D.   On: 06/03/2020 18:55   MR 3D Recon At Scanner  Result Date: 06/03/2020 CLINICAL DATA:  Epigastric pain for several days. Biliary ductal dilatation on recent CT. EXAM: MRI ABDOMEN WITHOUT CONTRAST  (INCLUDING MRCP) TECHNIQUE: Multiplanar multisequence MR imaging of the abdomen was performed. Heavily T2-weighted images of the biliary and pancreatic ducts were obtained, and three-dimensional MRCP images were rendered by post processing. COMPARISON:  CT on 06/03/2020 and ultrasound on 10/09/2013 FINDINGS: Lower chest: No acute findings. Hepatobiliary: No masses visualized on this unenhanced exam. Gallbladder is unremarkable. Mild diffuse biliary ductal dilatation is seen with common bile duct measuring 11 mm. No evidence of choledocholithiasis or  biliary stricture. Pancreas: No mass or inflammatory process visualized on this unenhanced exam. No evidence of pancreatic ductal dilatation or pancreas divisum. Spleen:  Within normal limits in size. Adrenals/Urinary tract: Unremarkable. No evidence of nephrolithiasis or hydronephrosis. Stomach/Bowel: Visualized portion unremarkable.  Vascular/Lymphatic: No pathologically enlarged lymph nodes identified. No evidence of abdominal aortic aneurysm. Other:  None. Musculoskeletal:  No suspicious bone lesions identified. IMPRESSION: Mild diffuse biliary ductal dilatation, with common bile duct measuring 11 mm. No radiographic evidence of choledocholithiasis or other obstructing etiology. Electronically Signed   By: Danae OrleansJohn A Stahl M.D.   On: 06/03/2020 18:55   Medications:  I have reviewed the patient's current medications. Prior to Admission:  Facility-Administered Medications Prior to Admission  Medication Dose Route Frequency Provider Last Rate Last Admin  . ipratropium-albuterol (DUONEB) 0.5-2.5 (3) MG/3ML nebulizer solution 3 mL  3 mL Nebulization Once Duanne LimerickJones, Deanna C, MD       Medications Prior to Admission  Medication Sig Dispense Refill Last Dose  . albuterol (VENTOLIN HFA) 108 (90 Base) MCG/ACT inhaler Inhale 2 puffs into the lungs every 6 (six) hours as needed for wheezing or shortness of breath. 1 Inhaler 11 prn at prn  . atorvastatin (LIPITOR) 10 MG tablet Take 1 tablet (10 mg total) by mouth daily. 90 tablet 1 06/03/2020 at 0630  . B Complex Vitamins (B COMPLEX PO) Take by mouth daily.   06/03/2020 at 0630  . Biotin 10 MG TABS Take 1 tablet by mouth daily.   06/03/2020 at 0630  . carbamide peroxide (DEBROX) 6.5 % OTIC solution Place 5 drops into both ears 2 (two) times daily. 15 mL 0 prn at prn  . cholecalciferol (VITAMIN D) 1000 UNITS tablet Take 1,000 Units by mouth daily.   06/03/2020 at 0630  . diclofenac Sodium (VOLTAREN) 1 % GEL Apply 2 g topically 4 (four) times daily. 150 g 0 prn at prn  . hydrochlorothiazide (HYDRODIURIL) 12.5 MG tablet Take 1 tablet (12.5 mg total) by mouth daily. 90 tablet 1 06/03/2020 at 0630  . Multiple Vitamins-Minerals (MULTIVITAMIN WOMEN 50+ PO) Take 1 tablet by mouth daily.   06/03/2020 at 0630  . Omega-3 Fatty Acids (FISH OIL) 1000 MG CAPS Take 1 capsule (1,000 mg total) by mouth daily. 100  capsule 3 06/03/2020 at 0630  . oxybutynin (DITROPAN-XL) 5 MG 24 hr tablet Take 1 tablet (5 mg total) by mouth at bedtime. 90 tablet 1 prn at prn  . triamcinolone ointment (KENALOG) 0.5 % Apply 1 application topically 2 (two) times daily. 30 g 0 prn at prn  . vitamin C (ASCORBIC ACID) 250 MG tablet Take 250 mg by mouth daily.   06/03/2020 at 0630  . etodolac (LODINE) 200 MG capsule Take 1 capsule (200 mg total) by mouth 2 (two) times daily. (Patient not taking: No sig reported) 30 capsule 1 Not Taking at Unknown time  . fluticasone furoate-vilanterol (BREO ELLIPTA) 100-25 MCG/INH AEPB Inhale 1 puff into the lungs in the morning. (Patient not taking: No sig reported) 28 each 5 Not Taking at Unknown time  . metoprolol succinate (TOPROL-XL) 25 MG 24 hr tablet Take 1 tablet (25 mg total) by mouth daily. 90 tablet 1    Scheduled: . vitamin C  250 mg Oral Daily  . cholecalciferol  1,000 Units Oral Daily  . enoxaparin (LOVENOX) injection  40 mg Subcutaneous Q2200  . fluticasone furoate-vilanterol  1 puff Inhalation Daily  . folic acid  1 mg Oral Daily  . hydrochlorothiazide  12.5 mg Oral  Daily  . metoprolol succinate  25 mg Oral Daily  . multivitamin with minerals  1 tablet Oral Daily  . oxybutynin  5 mg Oral QHS  . pantoprazole  40 mg Oral BID AC  . thiamine  100 mg Oral Daily   Or  . thiamine  100 mg Intravenous Daily   Continuous: . lactated ringers 125 mL/hr at 06/04/20 0851   FXT:[id number], ibuprofen, ketorolac, LORazepam **OR** LORazepam, morphine injection, ondansetron **OR** ondansetron (ZOFRAN) IV Anti-infectives (From admission, onward)   None     Scheduled Meds: . vitamin C  250 mg Oral Daily  . cholecalciferol  1,000 Units Oral Daily  . enoxaparin (LOVENOX) injection  40 mg Subcutaneous Q2200  . fluticasone furoate-vilanterol  1 puff Inhalation Daily  . folic acid  1 mg Oral Daily  . hydrochlorothiazide  12.5 mg Oral Daily  . metoprolol succinate  25 mg Oral Daily  .  multivitamin with minerals  1 tablet Oral Daily  . oxybutynin  5 mg Oral QHS  . pantoprazole  40 mg Oral BID AC  . thiamine  100 mg Oral Daily   Or  . thiamine  100 mg Intravenous Daily   Continuous Infusions: . lactated ringers 125 mL/hr at 06/04/20 0851   PRN Meds:.albuterol, ibuprofen, ketorolac, LORazepam **OR** LORazepam, morphine injection, ondansetron **OR** ondansetron (ZOFRAN) IV   Assessment: Principal Problem:   Elevated LFTs Active Problems:   Essential hypertension   Chronic obstructive pulmonary disease (HCC)   Familial hypercholesterolemia   Cigarette nicotine dependence without complication   Alcohol abuse   Plan: Epigastric pain, elevated LFTs and dilated CBD, chronic alcohol use CT and MRI did not confirm choledocholithiasis or biliary stricture.  Her CBD is dilated to 11 mm and gallbladder is intact with no evidence of cholelithiasis Elevated LFTs could be secondary to alcoholic liver disease/injury.  Alkaline phosphatase mildly elevated and total bilirubin is normal Viral hepatitis panel negative Given the presence of dilated CBD, will evaluate for ampullary stricture/mass with side-viewing scope Counseled patient to completely abstain from drinking alcohol Recommend EGD to rule out erosive esophagitis, alcohol induced gastritis, peptic ulcer disease Continue Protonix 40 mg p.o. twice daily Full liquid diet today N.p.o. effective 5 AM tomorrow Close follow-up with GI as outpatient  Discussed my recommendations with patient and her daughter   LOS: 0 days   Yuvin Bussiere 06/04/2020, 2:30 PM

## 2020-06-05 ENCOUNTER — Observation Stay: Payer: BC Managed Care – PPO | Admitting: Anesthesiology

## 2020-06-05 ENCOUNTER — Encounter
Admission: EM | Disposition: A | Discharge status: Home / Self Care | Payer: Self-pay | Source: Home / Self Care | Attending: Internal Medicine

## 2020-06-05 ENCOUNTER — Observation Stay: Payer: BC Managed Care – PPO

## 2020-06-05 DIAGNOSIS — R932 Abnormal findings on diagnostic imaging of liver and biliary tract: Secondary | ICD-10-CM | POA: Diagnosis not present

## 2020-06-05 DIAGNOSIS — D500 Iron deficiency anemia secondary to blood loss (chronic): Secondary | ICD-10-CM | POA: Diagnosis present

## 2020-06-05 DIAGNOSIS — K660 Peritoneal adhesions (postprocedural) (postinfection): Secondary | ICD-10-CM | POA: Diagnosis present

## 2020-06-05 DIAGNOSIS — J449 Chronic obstructive pulmonary disease, unspecified: Secondary | ICD-10-CM | POA: Diagnosis present

## 2020-06-05 DIAGNOSIS — E876 Hypokalemia: Secondary | ICD-10-CM | POA: Diagnosis not present

## 2020-06-05 DIAGNOSIS — K8040 Calculus of bile duct with cholecystitis, unspecified, without obstruction: Secondary | ICD-10-CM | POA: Diagnosis present

## 2020-06-05 DIAGNOSIS — Y848 Other medical procedures as the cause of abnormal reaction of the patient, or of later complication, without mention of misadventure at the time of the procedure: Secondary | ICD-10-CM | POA: Diagnosis not present

## 2020-06-05 DIAGNOSIS — K449 Diaphragmatic hernia without obstruction or gangrene: Secondary | ICD-10-CM | POA: Diagnosis present

## 2020-06-05 DIAGNOSIS — R748 Abnormal levels of other serum enzymes: Secondary | ICD-10-CM

## 2020-06-05 DIAGNOSIS — R7989 Other specified abnormal findings of blood chemistry: Secondary | ICD-10-CM | POA: Diagnosis not present

## 2020-06-05 DIAGNOSIS — K828 Other specified diseases of gallbladder: Secondary | ICD-10-CM | POA: Diagnosis present

## 2020-06-05 DIAGNOSIS — K31819 Angiodysplasia of stomach and duodenum without bleeding: Secondary | ICD-10-CM | POA: Diagnosis present

## 2020-06-05 DIAGNOSIS — R109 Unspecified abdominal pain: Secondary | ICD-10-CM | POA: Diagnosis not present

## 2020-06-05 DIAGNOSIS — I10 Essential (primary) hypertension: Secondary | ICD-10-CM | POA: Diagnosis present

## 2020-06-05 DIAGNOSIS — F17210 Nicotine dependence, cigarettes, uncomplicated: Secondary | ICD-10-CM | POA: Diagnosis present

## 2020-06-05 DIAGNOSIS — R188 Other ascites: Secondary | ICD-10-CM | POA: Diagnosis present

## 2020-06-05 DIAGNOSIS — R1013 Epigastric pain: Secondary | ICD-10-CM | POA: Diagnosis present

## 2020-06-05 DIAGNOSIS — Z20822 Contact with and (suspected) exposure to covid-19: Secondary | ICD-10-CM | POA: Diagnosis present

## 2020-06-05 DIAGNOSIS — Z79899 Other long term (current) drug therapy: Secondary | ICD-10-CM | POA: Diagnosis not present

## 2020-06-05 DIAGNOSIS — K2970 Gastritis, unspecified, without bleeding: Secondary | ICD-10-CM | POA: Diagnosis present

## 2020-06-05 DIAGNOSIS — E785 Hyperlipidemia, unspecified: Secondary | ICD-10-CM | POA: Diagnosis present

## 2020-06-05 DIAGNOSIS — K8050 Calculus of bile duct without cholangitis or cholecystitis without obstruction: Secondary | ICD-10-CM | POA: Diagnosis not present

## 2020-06-05 DIAGNOSIS — K859 Acute pancreatitis without necrosis or infection, unspecified: Secondary | ICD-10-CM | POA: Diagnosis not present

## 2020-06-05 DIAGNOSIS — K259 Gastric ulcer, unspecified as acute or chronic, without hemorrhage or perforation: Secondary | ICD-10-CM | POA: Diagnosis present

## 2020-06-05 DIAGNOSIS — F1010 Alcohol abuse, uncomplicated: Secondary | ICD-10-CM | POA: Diagnosis present

## 2020-06-05 DIAGNOSIS — E7801 Familial hypercholesterolemia: Secondary | ICD-10-CM | POA: Diagnosis present

## 2020-06-05 HISTORY — PX: ERCP: SHX5425

## 2020-06-05 HISTORY — PX: ESOPHAGOGASTRODUODENOSCOPY (EGD) WITH PROPOFOL: SHX5813

## 2020-06-05 LAB — BASIC METABOLIC PANEL
Anion gap: 9 (ref 5–15)
BUN: 13 mg/dL (ref 8–23)
CO2: 27 mmol/L (ref 22–32)
Calcium: 9.2 mg/dL (ref 8.9–10.3)
Chloride: 109 mmol/L (ref 98–111)
Creatinine, Ser: 0.87 mg/dL (ref 0.44–1.00)
GFR, Estimated: >60 mL/min (ref >60)
Glucose, Bld: 110 mg/dL — ABNORMAL HIGH (ref 70–99)
Potassium: 4.1 mmol/L (ref 3.5–5.1)
Sodium: 145 mmol/L (ref 135–145)

## 2020-06-05 LAB — HEPATIC FUNCTION PANEL
ALT: 79 U/L — ABNORMAL HIGH (ref 0–44)
AST: 32 U/L (ref 15–41)
Albumin: 3.6 g/dL (ref 3.5–5.0)
Alkaline Phosphatase: 123 U/L (ref 38–126)
Bilirubin, Direct: <0.1 mg/dL (ref 0.0–0.2)
Total Bilirubin: 0.7 mg/dL (ref 0.3–1.2)
Total Protein: 6.5 g/dL (ref 6.5–8.1)

## 2020-06-05 LAB — CBC WITH DIFFERENTIAL/PLATELET
Abs Immature Granulocytes: 0.01 10*3/uL (ref 0.00–0.07)
Basophils Absolute: 0.1 10*3/uL (ref 0.0–0.1)
Basophils Relative: 1 %
Eosinophils Absolute: 0.3 10*3/uL (ref 0.0–0.5)
Eosinophils Relative: 8 %
HCT: 30.5 % — ABNORMAL LOW (ref 36.0–46.0)
Hemoglobin: 9.8 g/dL — ABNORMAL LOW (ref 12.0–15.0)
Immature Granulocytes: 0 %
Lymphocytes Relative: 30 %
Lymphs Abs: 1.1 10*3/uL (ref 0.7–4.0)
MCH: 29.6 pg (ref 26.0–34.0)
MCHC: 32.1 g/dL (ref 30.0–36.0)
MCV: 92.1 fL (ref 80.0–100.0)
Monocytes Absolute: 0.4 10*3/uL (ref 0.1–1.0)
Monocytes Relative: 11 %
Neutro Abs: 1.8 10*3/uL (ref 1.7–7.7)
Neutrophils Relative %: 50 %
Platelets: 203 10*3/uL (ref 150–400)
RBC: 3.31 MIL/uL — ABNORMAL LOW (ref 3.87–5.11)
RDW: 11.5 % (ref 11.5–15.5)
WBC: 3.6 10*3/uL — ABNORMAL LOW (ref 4.0–10.5)
nRBC: 0 % (ref 0.0–0.2)

## 2020-06-05 LAB — MAGNESIUM: Magnesium: 2 mg/dL (ref 1.7–2.4)

## 2020-06-05 LAB — H. PYLORI BREATH TEST: H. pylori UBiT: NEGATIVE

## 2020-06-05 SURGERY — ESOPHAGOGASTRODUODENOSCOPY (EGD) WITH PROPOFOL
Anesthesia: General

## 2020-06-05 MED ORDER — TRAMADOL HCL 50 MG PO TABS
100.0000 mg | ORAL_TABLET | Freq: Four times a day (QID) | ORAL | Status: DC | PRN
Start: 2020-06-05 — End: 2020-06-06
  Administered 2020-06-05: 100 mg via ORAL
  Filled 2020-06-05 (×2): qty 2

## 2020-06-05 MED ORDER — PROCHLORPERAZINE EDISYLATE 10 MG/2ML IJ SOLN
10.0000 mg | Freq: Four times a day (QID) | INTRAMUSCULAR | Status: DC | PRN
  Administered 2020-06-05 – 2020-06-06 (×2): 10 mg via INTRAVENOUS
  Filled 2020-06-05 (×4): qty 2

## 2020-06-05 MED ORDER — SODIUM CHLORIDE 0.9 % IV SOLN: INTRAVENOUS | Status: DC

## 2020-06-05 MED ORDER — PROPOFOL 10 MG/ML IV BOLUS
INTRAVENOUS | Status: AC
  Filled 2020-06-05: qty 40

## 2020-06-05 MED ORDER — LACTATED RINGERS IV SOLN: INTRAVENOUS | Status: DC

## 2020-06-05 MED ORDER — PROPOFOL 10 MG/ML IV BOLUS
INTRAVENOUS | Status: AC
  Filled 2020-06-05: qty 20

## 2020-06-05 MED ORDER — INDOMETHACIN 50 MG RE SUPP
RECTAL | Status: DC | PRN
  Administered 2020-06-05: 100 mg via RECTAL

## 2020-06-05 MED ORDER — PROPOFOL 10 MG/ML IV BOLUS
INTRAVENOUS | Status: DC | PRN
  Administered 2020-06-05: 50 mg via INTRAVENOUS
  Administered 2020-06-05: 30 mg via INTRAVENOUS
  Administered 2020-06-05: 100 mg via INTRAVENOUS
  Administered 2020-06-05 (×3): 40 mg via INTRAVENOUS
  Administered 2020-06-05: 30 mg via INTRAVENOUS
  Administered 2020-06-05: 10 mg via INTRAVENOUS
  Administered 2020-06-05: 40 mg via INTRAVENOUS
  Administered 2020-06-05: 20 mg via INTRAVENOUS

## 2020-06-05 MED ORDER — MORPHINE SULFATE (PF) 2 MG/ML IV SOLN
1.0000 mg | INTRAVENOUS | Status: DC | PRN
  Administered 2020-06-05: 1 mg via INTRAVENOUS
  Filled 2020-06-05: qty 1

## 2020-06-05 MED ORDER — LIDOCAINE HCL (CARDIAC) PF 100 MG/5ML IV SOSY
PREFILLED_SYRINGE | INTRAVENOUS | Status: DC | PRN
  Administered 2020-06-05: 60 mg via INTRAVENOUS

## 2020-06-05 MED ORDER — SODIUM CHLORIDE (PF) 0.9 % IJ SOLN
INTRAMUSCULAR | Status: DC | PRN
  Administered 2020-06-05: 15 mL

## 2020-06-05 MED ORDER — SIMETHICONE 80 MG PO CHEW
160.0000 mg | CHEWABLE_TABLET | Freq: Four times a day (QID) | ORAL | Status: DC
  Administered 2020-06-05: 160 mg via ORAL
  Filled 2020-06-05 (×4): qty 2

## 2020-06-05 MED ORDER — INDOMETHACIN 50 MG RE SUPP
RECTAL | Status: AC
  Filled 2020-06-05: qty 2

## 2020-06-05 MED ORDER — INDOMETHACIN 50 MG RE SUPP: 100.0000 mg | Freq: Once | RECTAL | Status: DC

## 2020-06-05 NOTE — Anesthesia Preprocedure Evaluation (Addendum)
Anesthesia Evaluation  Patient identified by MRN, date of birth, ID band Patient awake    Reviewed: Allergy & Precautions, H&P , NPO status , Patient's Chart, lab work & pertinent test results  History of Anesthesia Complications Negative for: history of anesthetic complications  Airway Mallampati: II  TM Distance: >3 FB     Dental  (+) Upper Dentures, Partial Lower   Pulmonary neg sleep apnea, COPD,  COPD inhaler, Current Smoker and Patient abstained from smoking.,    breath sounds clear to auscultation       Cardiovascular hypertension, (-) angina(-) Past MI and (-) Cardiac Stents (-) dysrhythmias  Rhythm:regular Rate:Normal     Neuro/Psych negative neurological ROS  negative psych ROS   GI/Hepatic (+)     substance abuse  alcohol use,   Endo/Other  negative endocrine ROS  Renal/GU negative Renal ROS  negative genitourinary   Musculoskeletal   Abdominal   Peds  Hematology negative hematology ROS (+)   Anesthesia Other Findings Abd pain, elevated LFT's and dilated CBD without evidence of stone, presenting for EGD and ERCP.  H/o alcohol abuse, 3-4 glasses wine daily. No N/V Abd pain improved  Past Medical History: No date: Hypertension No date: Wears dentures     Comment:  full upper, partial lower  Past Surgical History: No date: CESAREAN SECTION 2012: COLONOSCOPY     Comment:  cleared for 5 yrs- [facility] 11/02/2016: COLONOSCOPY WITH PROPOFOL; N/A     Comment:  Procedure: COLONOSCOPY WITH PROPOFOL;  Surgeon: Toney Reil, MD;  Location: [facility];                Service: Gastroenterology;  Laterality: N/A;  BMI    Body Mass Index: 25.32 kg/m      Reproductive/Obstetrics negative OB ROS                          Anesthesia Physical Anesthesia Plan  ASA: III  Anesthesia Plan:    Post-op Pain Management:    Induction:   PONV Risk Score and  Plan: Propofol infusion and TIVA  Airway Management Planned: Simple Face Mask  Additional Equipment:   Intra-op Plan:   Post-operative Plan:   Informed Consent: I have reviewed the patients History and Physical, chart, labs and discussed the procedure including the risks, benefits and alternatives for the proposed anesthesia with the patient or authorized representative who has indicated his/her understanding and acceptance.     Dental Advisory Given  Plan Discussed with: Anesthesiologist, CRNA and Surgeon  Anesthesia Plan Comments:        Anesthesia Quick Evaluation

## 2020-06-05 NOTE — Progress Notes (Addendum)
PROGRESS NOTE    Samantha Irwin  WUJ:[id number] DOB: 08-27-1960 DOA: 06/03/2020 PCP: Duanne LimerickJones, Deanna C, MD    Chief Complaint  Patient presents with  . Abdominal Pain    Brief Narrative:  Patient 60 year old female history of hyperlipidemia, hypertension, daily alcohol use, tobacco use, postmenopausal presented to the ED with concerns of abdominal pain.  Patient noted to have a transaminitis.  CT abdomen and pelvis was done.  MRCP done.  GI consulted for further evaluation and management.   Assessment & Plan:   Principal Problem:   Elevated LFTs Active Problems:   Essential hypertension   Chronic obstructive pulmonary disease (HCC)   Familial hypercholesterolemia   Cigarette nicotine dependence without complication   Alcohol abuse   Abdominal pain   Hypokalemia  1 abdominal pain/transaminitis -Patient presented with abdominal pain noted to have a transaminitis with a AST to ALT ratio of 2:1. -Patient with history of daily alcohol use, currently on statin. -Clinical improvement however still with some diffuse abdominal discomfort.  -CT abdomen and pelvis with no evidence of bowel obstruction or bowel pathology, diverticulosis without evidence of diverticulitis, somewhat prominent common bile duct measuring up to 9 mm in diameter but no obstructing lesion in the distal duct or ampullary region noted. -MRCP done with mild diffuse biliary ductal dilatation with CBD measuring 11 mm, no radiographic evidence of choledocholithiasis or other obstructing etiology. -LFTs trending down quickly. -Concern for possible passed stone. -Acute hepatitis panel negative.  -Continue PPI twice daily per GI recommendations.   -Patient seen in consultation by GI patient for upper endoscopy today to rule out erosive esophagitis, alcohol induced gastritis, peptic ulcer disease  -Per GI.   2.  Alcohol abuse -Patient states drinks about 3 to 4 glasses of wine per day. -No signs of withdrawal.   -Alcohol cessation stressed to patient. -Continue thiamine, folic acid, multivitamin, Ativan withdrawal protocol.   3.  Normocytic anemia -Patient with no overt bleeding. -Anemia panel consistent with anemia of chronic disease. -Vitamin B12 level at 469. -We will place on oral vitamin B12 supplementation on discharge.  4.  Hypokalemia -Repleted.  Potassium of 4.1.    5.  Hyperlipidemia -Continue to hold statin secondary to transaminitis.    6.  Hypertension Continue current regimen of metoprolol, HCTZ.       DVT prophylaxis: Lovenox Code Status: Full Family Communication: Updated patient and daughter at bedside. Disposition:   Status is: Observation    Dispo: The patient is from: Home              Anticipated d/c is to: Home              Patient currently being worked up for abdominal pain, transaminitis, likely to undergo EGD today.  Not stable for discharge.     Difficult to place patient no       Consultants:   Gastroenterology: Dr. Allegra LaiVanga 06/03/2020  Procedures:   CT abdomen and pelvis 06/03/2020  MRCP 06/03/2020  EGD pending  Antimicrobials:   None   Subjective: Laying in bed.  Complain of some abdominal discomfort diffusely.  Abdominal pain overall improved from admission.  No nausea or emesis.  About to be taken down for upper endoscopy this morning.  Objective: Vitals:   06/04/20 2348 06/05/20 0416 06/05/20 0745 06/05/20 0800  BP: (!) 149/63 120/78 (!) 126/59   Pulse: 68 66 (!) 59 60  Resp: 18 14 17    Temp: 97.7 F (36.5 C) 98 F (36.7 C) 97.6  F (36.4 C)   TempSrc:   Oral   SpO2: 100% 100% 99%   Weight:      Height:        Intake/Output Summary (Last 24 hours) at 06/05/2020 1007 Last data filed at 06/05/2020 0200 Gross per 24 hour  Intake 270.88 ml  Output --  Net 270.88 ml   Filed Weights   06/03/20 1257  Weight: 60.8 kg    Examination:  General exam: : NAD Respiratory system: CTA B anterior lung fields.  No wheezes, no  rhonchi.  Speaking in full sentences.  Normal respiratory effort. Cardiovascular system: Regular rate and rhythm no murmurs rubs or gallops.  No JVD.  No lower extremity edema.  Gastrointestinal system: Abdomen soft, some discomfort diffusely mid abdominal region to palpation.  No rebound.  No guarding.  Central nervous system: Alert and oriented. No focal neurological deficits. Extremities: Symmetric 5 x 5 power. Skin: No rashes, lesions or ulcers Psychiatry: Judgement and insight appear normal. Mood & affect appropriate.    Data Reviewed: I have personally reviewed following labs and imaging studies  CBC: Recent Labs  Lab 06/03/20 1318 06/04/20 0403 06/05/20 0455  WBC 5.4 3.3* 3.6*  NEUTROABS  --   --  1.8  HGB 10.6* 10.1* 9.8*  HCT 32.7* 30.3* 30.5*  MCV 91.6 91.0 92.1  PLT 253 221 203    Basic Metabolic Panel: Recent Labs  Lab 06/03/20 1318 06/03/20 2305 06/04/20 0403 06/05/20 0455  NA 136 135 139 145  K 3.2* 3.2* 3.4* 4.1  CL 99 100 104 109  CO2 24 25 28 27   GLUCOSE 111* 143* 88 110*  BUN 22 25* 23 13  CREATININE 0.86 1.22* 0.94 0.87  CALCIUM 9.2 8.8* 8.9 9.2  MG  --  1.7 1.9 2.0  PHOS  --  4.4  --   --     GFR: Estimated Creatinine Clearance: 56.8 mL/min (by C-G formula based on SCr of 0.87 mg/dL).  Liver Function Tests: Recent Labs  Lab 06/03/20 1318 06/03/20 2305 06/04/20 0403 06/05/20 0455  AST 296* 288* 144* 32  ALT 88* 167* 142* 79*  ALKPHOS 130* 158* 155* 123  BILITOT 1.1 1.3* 0.9 0.7  PROT 7.4 6.6 6.5 6.5  ALBUMIN 4.0 3.5 3.5 3.6    CBG: No results for input(s): GLUCAP in the last 168 hours.   Recent Results (from the past 240 hour(s))  SARS CORONAVIRUS 2 (TAT 6-24 HRS) Nasopharyngeal Nasopharyngeal Swab     Status: None   Collection Time: 06/03/20  4:08 PM   Specimen: Nasopharyngeal Swab  Result Value Ref Range Status   SARS Coronavirus 2 NEGATIVE NEGATIVE Final    Comment: (NOTE) SARS-CoV-2 target nucleic acids are NOT  DETECTED.  The SARS-CoV-2 RNA is generally detectable in upper and lower respiratory specimens during the acute phase of infection. Negative results do not preclude SARS-CoV-2 infection, do not rule out co-infections with other pathogens, and should not be used as the sole basis for treatment or other patient management decisions. Negative results must be combined with clinical observations, patient history, and epidemiological information. The expected result is Negative.  Fact Sheet for Patients: 06/05/20  Fact Sheet for Healthcare Providers: HairSlick.no  This test is not yet approved or cleared by the quierodir[employer]ir.com FDA and  has been authorized for detection and/or diagnosis of SARS-CoV-2 by FDA under an Emergency Use Authorization (EUA). This EUA will remain  in effect (meaning this test can be used) for the duration of  the COVID-19 declaration under Se ction 564(b)(1) of the Act, 21 U.S.C. section 360bbb-3(b)(1), unless the authorization is terminated or revoked sooner.  Performed at [facility], [address], [place], [place] [address]          Radiology Studies: CT ABDOMEN PELVIS W CONTRAST  Result Date: 06/03/2020 CLINICAL DATA:  Epigastric pain over the last several days. Bowel obstruction suspected. EXAM: CT ABDOMEN AND PELVIS WITH CONTRAST TECHNIQUE: Multidetector CT imaging of the abdomen and pelvis was performed using the standard protocol following bolus administration of intravenous contrast. CONTRAST:  OMNIPAQUE IOHEXOL 300 MG/ML  SOLN COMPARISON:  None. FINDINGS: Lower chest: Normal Hepatobiliary: The liver has a normal appearance without focal lesions or intrahepatic biliary ductal dilatation. Common bile duct is somewhat prominent, measuring up to 9 mm in diameter, but I do not see an obstructing lesion in the distal duct or ampullary region. No calcified gallstones. Pancreas: Normal  Spleen: Normal Adrenals/Urinary Tract: Adrenal glands are normal. Kidneys are normal. Bladder is normal. Stomach/Bowel: Stomach and small intestine are normal. The appendix is normal. No colon pathology is evident. Amount of fecal matter within normal limits. Diverticulosis of the left colon without evidence of diverticulitis. Vascular/Lymphatic: Aortic atherosclerosis. No aneurysm. IVC is normal. No retroperitoneal adenopathy. Reproductive: Normal.  No pelvic mass. Other: No free fluid or air.  No hernia. Musculoskeletal: Normal.  No significant degenerative changes. IMPRESSION: 1. No acute finding to explain the clinical presentation. No evidence of bowel obstruction or other bowel pathology. Diverticulosis of the left colon without evidence of diverticulitis. 2. Aortic atherosclerosis. 3. Common bile duct is somewhat prominent, measuring up to 9 mm in diameter, but I do not see an obstructing lesion in the distal duct or ampullary region. Aortic Atherosclerosis (ICD10-I70.0). Electronically Signed   By: Paulina Fusi M.D.   On: 06/03/2020 14:39   MR ABDOMEN MRCP WO CONTRAST  Result Date: 06/03/2020 CLINICAL DATA:  Epigastric pain for several days. Biliary ductal dilatation on recent CT. EXAM: MRI ABDOMEN WITHOUT CONTRAST  (INCLUDING MRCP) TECHNIQUE: Multiplanar multisequence MR imaging of the abdomen was performed. Heavily T2-weighted images of the biliary and pancreatic ducts were obtained, and three-dimensional MRCP images were rendered by post processing. COMPARISON:  CT on 06/03/2020 and ultrasound on 10/09/2013 FINDINGS: Lower chest: No acute findings. Hepatobiliary: No masses visualized on this unenhanced exam. Gallbladder is unremarkable. Mild diffuse biliary ductal dilatation is seen with common bile duct measuring 11 mm. No evidence of choledocholithiasis or biliary stricture. Pancreas: No mass or inflammatory process visualized on this unenhanced exam. No evidence of pancreatic ductal dilatation or  pancreas divisum. Spleen:  Within normal limits in size. Adrenals/Urinary tract: Unremarkable. No evidence of nephrolithiasis or hydronephrosis. Stomach/Bowel: Visualized portion unremarkable. Vascular/Lymphatic: No pathologically enlarged lymph nodes identified. No evidence of abdominal aortic aneurysm. Other:  None. Musculoskeletal:  No suspicious bone lesions identified. IMPRESSION: Mild diffuse biliary ductal dilatation, with common bile duct measuring 11 mm. No radiographic evidence of choledocholithiasis or other obstructing etiology. Electronically Signed   By: Danae Orleans M.D.   On: 06/03/2020 18:55   MR 3D Recon At Scanner  Result Date: 06/03/2020 CLINICAL DATA:  Epigastric pain for several days. Biliary ductal dilatation on recent CT. EXAM: MRI ABDOMEN WITHOUT CONTRAST  (INCLUDING MRCP) TECHNIQUE: Multiplanar multisequence MR imaging of the abdomen was performed. Heavily T2-weighted images of the biliary and pancreatic ducts were obtained, and three-dimensional MRCP images were rendered by post processing. COMPARISON:  CT on 06/03/2020 and ultrasound on 10/09/2013  FINDINGS: Lower chest: No acute findings. Hepatobiliary: No masses visualized on this unenhanced exam. Gallbladder is unremarkable. Mild diffuse biliary ductal dilatation is seen with common bile duct measuring 11 mm. No evidence of choledocholithiasis or biliary stricture. Pancreas: No mass or inflammatory process visualized on this unenhanced exam. No evidence of pancreatic ductal dilatation or pancreas divisum. Spleen:  Within normal limits in size. Adrenals/Urinary tract: Unremarkable. No evidence of nephrolithiasis or hydronephrosis. Stomach/Bowel: Visualized portion unremarkable. Vascular/Lymphatic: No pathologically enlarged lymph nodes identified. No evidence of abdominal aortic aneurysm. Other:  None. Musculoskeletal:  No suspicious bone lesions identified. IMPRESSION: Mild diffuse biliary ductal dilatation, with common bile duct  measuring 11 mm. No radiographic evidence of choledocholithiasis or other obstructing etiology. Electronically Signed   By: Danae Orleans M.D.   On: 06/03/2020 18:55        Scheduled Meds: . vitamin C  250 mg Oral Daily  . cholecalciferol  1,000 Units Oral Daily  . enoxaparin (LOVENOX) injection  40 mg Subcutaneous Q2200  . fluticasone furoate-vilanterol  1 puff Inhalation Daily  . folic acid  1 mg Oral Daily  . hydrochlorothiazide  12.5 mg Oral Daily  . indomethacin  100 mg Rectal Once  . indomethacin      . metoprolol succinate  25 mg Oral Daily  . multivitamin with minerals  1 tablet Oral Daily  . oxybutynin  5 mg Oral QHS  . pantoprazole  40 mg Oral BID AC  . thiamine  100 mg Oral Daily   Or  . thiamine  100 mg Intravenous Daily   Continuous Infusions: . sodium chloride 20 mL/hr at 06/04/20 1709     LOS: 0 days    Time spent: 35 minutes    Ramiro Harvest, MD [facility]   To contact the attending provider between 7A-7P or the covering provider during after hours 7P-7A, please log into the web site [email or web address] and access using universal [place] password for that web site. If you do not have the password, please call the hospital operator.  06/05/2020, 10:07 AM

## 2020-06-05 NOTE — Op Note (Addendum)
[facility] Gastroenterology Patient Name: Samantha Irwin Procedure Date: 06/05/2020 11:07 AM MRN: [id number] Account #: [id number] Date of Birth: May 08, 1960 Admit Type: Inpatient Age: 60 Room: [facility] ENDO ROOM 4 Gender: Female Note Status: Finalized Procedure:             Upper GI endoscopy Indications:           Iron deficiency anemia Providers:             Midge Minium MD, MD Referring MD:          Duanne Limerick, MD (Referring MD) Medicines:             Propofol per Anesthesia Complications:         No immediate complications. Procedure:             Pre-Anesthesia Assessment:                        - Prior to the procedure, a History and Physical was                         performed, and patient medications and allergies were                         reviewed. The patient's tolerance of previous                         anesthesia was also reviewed. The risks and benefits                         of the procedure and the sedation options and risks                         were discussed with the patient. All questions were                         answered, and informed consent was obtained. Prior                         Anticoagulants: The patient has taken no previous                         anticoagulant or antiplatelet agents. ASA Grade                         Assessment: II - A patient with mild systemic disease.                         After reviewing the risks and benefits, the patient                         was deemed in satisfactory condition to undergo the                         procedure.                        After obtaining informed consent, the endoscope was  passed under direct vision. Throughout the procedure,                         the patient's blood pressure, pulse, and oxygen                         saturations were monitored continuously. The Endoscope                         was introduced through the mouth, and  advanced to the                         second part of duodenum. The upper GI endoscopy was                         accomplished without difficulty. The patient tolerated                         the procedure well. Findings:      A small hiatal hernia was present.      A single 2 mm angiodysplastic lesion with no bleeding was found in the       cardia. Coagulation for tissue destruction using argon plasma at 2       liters/minute and 60 watts was successful.      Localized moderate inflammation characterized by erythema was found in       the gastric antrum.      Two 2 mm angiodysplastic lesions without bleeding were found in the       second portion of the duodenum. Coagulation for hemostasis using argon       plasma at 2 liters/minute and 60 watts was successful. Impression:            - Small hiatal hernia.                        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 Treated with argon plasma coagulation (APC).                        - Gastritis.                        - Two non-bleeding angiodysplastic lesions in the                         duodenum. Treated with argon plasma coagulation (APC).                        - No specimens collected. Recommendation:        - Return patient to hospital ward for ongoing care.                        - Perform an ERCP today. Procedure Code(s):     --- Professional ---                        [phone], Esophagogastroduodenoscopy, flexible,  transoral; with ablation of tumor(s), polyp(s), or                         other lesion(s) (includes pre- and post-dilation and                         guide wire passage, when performed)                        [id number], 59, Esophagogastroduodenoscopy, flexible,                         transoral; with control of bleeding, any method Diagnosis Code(s):     --- Professional ---                        D50.9, Iron deficiency anemia, unspecified                         K29.70, Gastritis, unspecified, without bleeding                        K31.819, Angiodysplasia of stomach and duodenum                         without bleeding CPT copyright 2019 American Medical Association. All rights reserved. The codes documented in this report are preliminary and upon coder review may  be revised to meet current compliance requirements. Midge Minium MD, MD 06/05/2020 11:28:43 AM This report has been signed electronically. Number of Addenda: 0 Note Initiated On: 06/05/2020 11:07 AM Estimated Blood Loss:  Estimated blood loss: none.      [facility]

## 2020-06-05 NOTE — Op Note (Addendum)
[facility] Gastroenterology Patient Name: Samantha Irwin Procedure Date: 06/05/2020 11:05 AM MRN: [id number] Account #: [id number] Date of Birth: 09/15/1960 Admit Type: Inpatient Age: 60 Room: [facility] ENDO ROOM 4 Gender: Female Note Status: Finalized Procedure:             ERCP Indications:           Biliary dilation on magnetic resonance                         cholangiopancreatography, Elevated liver enzymes Providers:             Midge Minium MD, MD Referring MD:          Duanne Limerick, MD (Referring MD) Medicines:             Propofol per Anesthesia Complications:         No immediate complications. Procedure:             Pre-Anesthesia Assessment:                        - Prior to the procedure, a History and Physical was                         performed, and patient medications and allergies were                         reviewed. The patient's tolerance of previous                         anesthesia was also reviewed. The risks and benefits                         of the procedure and the sedation options and risks                         were discussed with the patient. All questions were                         answered, and informed consent was obtained. Prior                         Anticoagulants: The patient has taken no previous                         anticoagulant or antiplatelet agents. ASA Grade                         Assessment: II - A patient with mild systemic disease.                         After reviewing the risks and benefits, the patient                         was deemed in satisfactory condition to undergo the                         procedure.  After obtaining informed consent, the scope was passed                         under direct vision. Throughout the procedure, the                         patient's blood pressure, pulse, and oxygen                         saturations were monitored continuously. The was                          introduced through the mouth, and used to inject                         contrast into and used to inject contrast into the                         bile duct. The ERCP was accomplished without                         difficulty. The patient tolerated the procedure well. Findings:      The scout film was normal. The esophagus was successfully intubated       under direct vision. The scope was advanced to a normal major papilla in       the descending duodenum without detailed examination of the pharynx,       larynx and associated structures, and upper GI tract. The upper GI tract       was grossly normal. The bile duct was deeply cannulated with the       short-nosed traction sphincterotome. Contrast was injected. I personally       interpreted the bile duct images. There was brisk flow of contrast       through the ducts. Image quality was excellent. Contrast extended to the       entire biliary tree. The lower third of the main bile duct contained       filling defect(s). A wire was passed into the biliary tree. A 6 mm       biliary sphincterotomy was made with a traction (standard)       sphincterotome using ERBE electrocautery. There was no       post-sphincterotomy bleeding. The biliary tree was swept with a 15 mm       balloon starting at the bifurcation. Sludge was swept from the duct. The       upper GI tract was traversed under direct vision without detailed       examination. One non-bleeding cratered gastric ulcer with no stigmata of       bleeding was found at the pylorus. Impression:            - A filling defect was seen on the cholangiogram.                        - A biliary sphincterotomy was performed.                        - The biliary tree was swept and sludge was found. Recommendation:        - Return patient to hospital ward  for ongoing care.                        - Clear liquid diet today. Procedure Code(s):     --- Professional ---                         [id number], Endoscopic retrograde cholangiopancreatography                         (ERCP); with removal of calculi/debris from                         biliary/pancreatic duct(s)                        [id number], Endoscopic retrograde cholangiopancreatography                         (ERCP); with sphincterotomy/papillotomy                        [phone], Endoscopic catheterization of the biliary                         ductal system, radiological supervision and                         interpretation Diagnosis Code(s):     --- Professional ---                        K83.9, Disease of biliary tract, unspecified                        R74.8, Abnormal levels of other serum enzymes                        R93.2, Abnormal findings on diagnostic imaging of                         liver and biliary tract CPT copyright 2019 American Medical Association. All rights reserved. The codes documented in this report are preliminary and upon coder review may  be revised to meet current compliance requirements. Midge Minium MD, MD 06/05/2020 11:49:09 AM This report has been signed electronically. Number of Addenda: 0 Note Initiated On: 06/05/2020 11:05 AM Estimated Blood Loss:  Estimated blood loss: none.      [facility]

## 2020-06-05 NOTE — Plan of Care (Signed)
Shift Summary: No acute events overnight. Oriented x4, VSS. No c/o pain overnight. PRN melatonin given x1 to assist with sleep. NPO at 0500 for EGD today, pt aware. Rounding performed, needs/concerns addressed, fall/safety precautions in place.   Problem: Education: Goal: Knowledge of General Education information will improve Description: Including pain rating scale, medication(s)/side effects and non-pharmacologic comfort measures Outcome: Progressing   Problem: Health Behavior/Discharge Planning: Goal: Ability to manage health-related needs will improve Outcome: Progressing   Problem: Clinical Measurements: Goal: Ability to maintain clinical measurements within normal limits will improve Outcome: Progressing Goal: Will remain free from infection Outcome: Progressing Goal: Diagnostic test results will improve Outcome: Progressing Goal: Respiratory complications will improve Outcome: Progressing Goal: Cardiovascular complication will be avoided Outcome: Progressing   Problem: Activity: Goal: Risk for activity intolerance will decrease Outcome: Progressing   Problem: Nutrition: Goal: Adequate nutrition will be maintained Outcome: Progressing   Problem: Coping: Goal: Level of anxiety will decrease Outcome: Progressing   Problem: Elimination: Goal: Will not experience complications related to bowel motility Outcome: Progressing Goal: Will not experience complications related to urinary retention Outcome: Progressing   Problem: Pain Managment: Goal: General experience of comfort will improve Outcome: Progressing   Problem: Safety: Goal: Ability to remain free from injury will improve Outcome: Progressing   Problem: Skin Integrity: Goal: Risk for impaired skin integrity will decrease Outcome: Progressing

## 2020-06-05 NOTE — Anesthesia Postprocedure Evaluation (Signed)
Anesthesia Post Note  Patient: Samantha Irwin  Procedure(s) Performed: ESOPHAGOGASTRODUODENOSCOPY (EGD) WITH PROPOFOL (N/A ) ENDOSCOPIC RETROGRADE CHOLANGIOPANCREATOGRAPHY (ERCP) (N/A )  Patient location during evaluation: PACU Anesthesia Type: General Level of consciousness: awake and alert Pain management: pain level controlled Vital Signs Assessment: post-procedure vital signs reviewed and stable Respiratory status: spontaneous breathing, nonlabored ventilation and respiratory function stable Cardiovascular status: blood pressure returned to baseline and stable Postop Assessment: no apparent nausea or vomiting Anesthetic complications: no   No complications documented.   Last Vitals:  Vitals:   06/05/20 1240 06/05/20 1405  BP: (!) 177/118 (!) 178/97  Pulse: (!) 57 63  Resp: 20   Temp: (!) 36.3 C   SpO2: 100% 100%    Last Pain:  Vitals:   06/05/20 1405  TempSrc:   PainSc: 6                  Karleen Hampshire

## 2020-06-05 NOTE — TOC Initial Note (Signed)
Transition of Care [facility]) - Initial/Assessment Note    Patient Details  Name: Samantha Irwin MRN: [id number] Date of Birth: [birth year]  Transition of Care [facility]) CM/SW Contact:    Allayne Butcher, RN Phone Number: 06/05/2020, 2:58 PM  Clinical Narrative:                 TOC has received consult for substance abuse counseling.  Patient has reported that she currently drinks 3-4 glasses of wine every day.  Patient had EDG today and when she came back from her procedure she was having abdominal pain.  Family at the bedside.  RNCM will reach out to patient in the morning to provide resources for substance abuse after patient has had a chance to recover from her procedure today.     Expected Discharge Plan: Home/Self Care Barriers to Discharge: Continued Medical Work up   Patient Goals and CMS Choice        Expected Discharge Plan and Services Expected Discharge Plan: Home/Self Care   Discharge Planning Services: CM Consult                     DME Arranged: N/A DME Agency: NA       HH Arranged: NA          Prior Living Arrangements/Services     Patient language and need for interpreter reviewed:: Yes              Criminal Activity/Legal Involvement Pertinent to Current Situation/Hospitalization: No - Comment as needed  Activities of Daily Living Home Assistive Devices/Equipment: None ADL Screening (condition at time of admission) Patient's cognitive ability adequate to safely complete daily activities?: Yes Is the patient deaf or have difficulty hearing?: No Does the patient have difficulty seeing, even when wearing glasses/contacts?: No Does the patient have difficulty concentrating, remembering, or making decisions?: No Patient able to express need for assistance with ADLs?: Yes Does the patient have difficulty dressing or bathing?: No Independently performs ADLs?: Yes (appropriate for developmental age) Does the patient have difficulty walking or  climbing stairs?: No Weakness of Legs: None Weakness of Arms/Hands: None  Permission Sought/Granted                  Emotional Assessment       Orientation: : Oriented to Self,Oriented to Place,Oriented to  Time,Oriented to Situation Alcohol / Substance Use: Alcohol Use Psych Involvement: No (comment)  Admission diagnosis:  Epigastric pain [R10.13] Elevated LFTs [R79.89] Acute bronchitis with COPD (HCC) [J44.0, J20.9] Abdominal pain [R10.9] Patient Active Problem List   Diagnosis Date Noted  . Iron deficiency anemia due to chronic blood loss   . Gastritis without bleeding   . Elevated liver enzymes   . Abnormal magnetic resonance imaging of liver   . Abdominal pain   . Hypokalemia   . Elevated LFTs 06/03/2020  . Alcohol abuse 06/03/2020  . Overactive bladder 05/30/2018  . Chronic obstructive pulmonary disease (HCC) 05/30/2018  . Familial hypercholesterolemia 05/30/2018  . Flexural eczema 05/30/2018  . Cigarette nicotine dependence without complication 05/30/2018  . Atherosclerosis of aorta (HCC) 05/12/2017  . Screening for colon cancer   . Essential hypertension 06/12/2015  . Annual physical exam 08/05/2014   PCP:  Duanne Limerick, MD Pharmacy:   [facility] [address], [place] - [address] [place] [address] Phone: [phone] Fax: [phone]     Social Determinants of Health (SDOH) Interventions  Readmission Risk Interventions No flowsheet data found.

## 2020-06-05 NOTE — Transfer of Care (Signed)
Immediate Anesthesia Transfer of Care Note  Patient: Samantha Irwin  Procedure(s) Performed: ESOPHAGOGASTRODUODENOSCOPY (EGD) WITH PROPOFOL (N/A ) ENDOSCOPIC RETROGRADE CHOLANGIOPANCREATOGRAPHY (ERCP) (N/A )  Patient Location: PACU and Endoscopy Unit  Anesthesia Type:General  Level of Consciousness: awake, alert  and oriented  Airway & Oxygen Therapy: Patient Spontanous Breathing  Post-op Assessment: Report given to RN and Post -op Vital signs reviewed and stable  Post vital signs: Reviewed and stable  Last Vitals:  Vitals Value Taken Time  BP    Temp    Pulse    Resp    SpO2      Last Pain:  Vitals:   06/05/20 1014  TempSrc: Temporal  PainSc:          Complications: No complications documented.

## 2020-06-06 ENCOUNTER — Encounter: Payer: Self-pay | Admitting: Gastroenterology

## 2020-06-06 DIAGNOSIS — K859 Acute pancreatitis without necrosis or infection, unspecified: Secondary | ICD-10-CM

## 2020-06-06 DIAGNOSIS — K259 Gastric ulcer, unspecified as acute or chronic, without hemorrhage or perforation: Secondary | ICD-10-CM | POA: Diagnosis present

## 2020-06-06 DIAGNOSIS — K9189 Other postprocedural complications and disorders of digestive system: Secondary | ICD-10-CM

## 2020-06-06 DIAGNOSIS — K31819 Angiodysplasia of stomach and duodenum without bleeding: Secondary | ICD-10-CM

## 2020-06-06 DIAGNOSIS — K8050 Calculus of bile duct without cholangitis or cholecystitis without obstruction: Secondary | ICD-10-CM

## 2020-06-06 HISTORY — DX: Angiodysplasia of stomach and duodenum without bleeding: K31.819

## 2020-06-06 HISTORY — DX: Other postprocedural complications and disorders of digestive system: K91.89

## 2020-06-06 HISTORY — DX: Gastric ulcer, unspecified as acute or chronic, without hemorrhage or perforation: K25.9

## 2020-06-06 HISTORY — DX: Acute pancreatitis without necrosis or infection, unspecified: K85.90

## 2020-06-06 LAB — CBC
HCT: 33 % — ABNORMAL LOW (ref 36.0–46.0)
Hemoglobin: 10.9 g/dL — ABNORMAL LOW (ref 12.0–15.0)
MCH: 30.3 pg (ref 26.0–34.0)
MCHC: 33 g/dL (ref 30.0–36.0)
MCV: 91.7 fL (ref 80.0–100.0)
Platelets: 229 10*3/uL (ref 150–400)
RBC: 3.6 MIL/uL — ABNORMAL LOW (ref 3.87–5.11)
RDW: 11.3 % — ABNORMAL LOW (ref 11.5–15.5)
WBC: 9.5 10*3/uL (ref 4.0–10.5)
nRBC: 0 % (ref 0.0–0.2)

## 2020-06-06 LAB — COMPREHENSIVE METABOLIC PANEL
ALT: 61 U/L — ABNORMAL HIGH (ref 0–44)
AST: 21 U/L (ref 15–41)
Albumin: 3.7 g/dL (ref 3.5–5.0)
Alkaline Phosphatase: 128 U/L — ABNORMAL HIGH (ref 38–126)
Anion gap: 12 (ref 5–15)
BUN: 14 mg/dL (ref 8–23)
CO2: 21 mmol/L — ABNORMAL LOW (ref 22–32)
Calcium: 8.8 mg/dL — ABNORMAL LOW (ref 8.9–10.3)
Chloride: 107 mmol/L (ref 98–111)
Creatinine, Ser: 0.86 mg/dL (ref 0.44–1.00)
GFR, Estimated: >60 mL/min (ref >60)
Glucose, Bld: 109 mg/dL — ABNORMAL HIGH (ref 70–99)
Potassium: 3.7 mmol/L (ref 3.5–5.1)
Sodium: 140 mmol/L (ref 135–145)
Total Bilirubin: 1.3 mg/dL — ABNORMAL HIGH (ref 0.3–1.2)
Total Protein: 6.9 g/dL (ref 6.5–8.1)

## 2020-06-06 LAB — LIPASE, BLOOD: Lipase: 1035 U/L — ABNORMAL HIGH (ref 11–51)

## 2020-06-06 MED ORDER — SODIUM CHLORIDE 0.9 % IV BOLUS
2000.0000 mL | Freq: Once | INTRAVENOUS | Status: AC
  Administered 2020-06-06: 2000 mL via INTRAVENOUS

## 2020-06-06 MED ORDER — SODIUM CHLORIDE 0.9 % IV SOLN
1.0000 mg | Freq: Every day | INTRAVENOUS | Status: DC
  Administered 2020-06-06 – 2020-06-08 (×3): 1 mg via INTRAVENOUS
  Filled 2020-06-06 (×4): qty 0.2

## 2020-06-06 MED ORDER — METOPROLOL TARTRATE 5 MG/5ML IV SOLN
5.0000 mg | Freq: Three times a day (TID) | INTRAVENOUS | Status: DC
  Administered 2020-06-06 – 2020-06-07 (×4): 5 mg via INTRAVENOUS
  Filled 2020-06-06 (×4): qty 5

## 2020-06-06 MED ORDER — PANTOPRAZOLE SODIUM 40 MG IV SOLR
40.0000 mg | Freq: Two times a day (BID) | INTRAVENOUS | Status: DC
  Administered 2020-06-06 – 2020-06-08 (×5): 40 mg via INTRAVENOUS
  Filled 2020-06-06 (×5): qty 40

## 2020-06-06 MED ORDER — MORPHINE SULFATE (PF) 2 MG/ML IV SOLN: 2.0000 mg | INTRAVENOUS | Status: DC | PRN

## 2020-06-06 NOTE — Consult Note (Signed)
SURGICAL CONSULTATION NOTE   HISTORY OF PRESENT ILLNESS (HPI):  60 y.o. female [facility] for evaluation of abdominal pain since 3 days ago. Patient reports she started with pain on 06/03/2020.  The pain was on the epigastric area and radiated to her back.  The pain was very intense.  There was no alleviating or aggravating factors.  The patient came to the ED for evaluation.  The patient had CT scan that showed very dilated common bile duct.  Patient was also found with elevated liver enzyme and bilirubin.  Patient was admitted for further work-up.  MRCP was done showing dilation of the common bile duct of 11 mm.  No sign of choledocholithiasis.  Due to the common bile duct dilation and elevated liver enzyme patient proceeded with ERCP.  ERCP confirmed no filling defect and sludge removed from the gallbladder without any mass.  I personally evaluated the images of the CT scan, MRCP, ERCP.  Patient had ERCP yesterday.  Today with elevated lipase.  The patient reported that she does not have abdominal pain at this moment.  She denies any nausea or vomiting.  Patient denies any fever or chills.  Surgery is consulted by Dr. Janee Mornhompson in this context for evaluation and management of choledocholithiasis.  PAST MEDICAL HISTORY (PMH):  Past Medical History:  Diagnosis Date  . Hypertension   . Wears dentures    full upper, partial lower     PAST SURGICAL HISTORY (PSH):  Past Surgical History:  Procedure Laterality Date  . CESAREAN SECTION    . COLONOSCOPY  2012   [place]rs[place]  . COLONOSCOPY WITH PROPOFOL N/A 11/02/2016   Procedure: COLONOSCOPY WITH PROPOFOL;  Surgeon: Toney ReilVanga, Rohini Reddy, MD;  [facility];  Service: Gastroenterology;  Laterality: N/A;  . ERCP N/A 06/05/2020   Procedure: ENDOSCOPIC RETROGRADE CHOLANGIOPANCREATOGRAPHY (ERCP);  Surgeon: Midge MiniumWohl, Darren, MD;  Location[facility] ENDOSCOPY;  Service: Endoscopy;  Laterality: N/A;  .  ESOPHAGOGASTRODUODENOSCOPY (EGD) WITH PROPOFOL N/A 06/05/2020   Procedure: ESOPHAGOGASTRODUODENOSCOPY (EGD) WITH PROPOFOL;  Surgeon: Midge MiniumWohl, Darren, MD;  [facility] ENDOSCOPY;  Service: Endoscopy;  Laterality: N/A;     MEDICATIONS:  Prior to Admission medications   Medication Sig Start Date End Date Taking? Authorizing Provider  albuterol (VENTOLIN HFA) 108 (90 Base) MCG/ACT inhaler Inhale 2 puffs into the lungs every 6 (six) hours as needed for wheezing or shortness of breath. 05/30/18  Yes Duanne LimerickJones, Deanna C, MD  atorvastatin (LIPITOR) 10 MG tablet Take 1 tablet (10 mg total) by mouth daily. 04/08/20  Yes Duanne LimerickJones, Deanna C, MD  B Complex Vitamins (B COMPLEX PO) Take by mouth daily.   Yes [provider]  Biotin 10 MG TABS Take 1 tablet by mouth daily.   Yes [provider]  carbamide peroxide (DEBROX) 6.5 % OTIC solution Place 5 drops into both ears 2 (two) times daily. 02/27/19  Yes Duanne LimerickJones, Deanna C, MD  cholecalciferol (VITAMIN D) 1000 UNITS tablet Take 1,000 Units by mouth daily.   Yes [provider]  diclofenac Sodium (VOLTAREN) 1 % GEL Apply 2 g topically 4 (four) times daily. 03/19/20  Yes Duanne LimerickJones, Deanna C, MD  hydrochlorothiazide (HYDRODIURIL) 12.5 MG tablet Take 1 tablet (12.5 mg total) by mouth daily. 04/08/20  Yes Duanne LimerickJones, Deanna C, MD  Multiple Vitamins-Minerals (MULTIVITAMIN WOMEN 50+ PO) Take 1 tablet by mouth daily.   Yes [provider]  Omega-3 Fatty Acids (FISH OIL) 1000 MG CAPS Take 1 capsule (1,000 mg total)  by mouth daily. 04/08/20  Yes Duanne Limerick, MD  oxybutynin (DITROPAN-XL) 5 MG 24 hr tablet Take 1 tablet (5 mg total) by mouth at bedtime. 08/24/19  Yes Duanne Limerick, MD  triamcinolone ointment (KENALOG) 0.5 % Apply 1 application topically 2 (two) times daily. 05/30/18  Yes Duanne Limerick, MD  vitamin C (ASCORBIC ACID) 250 MG tablet Take 250 mg by mouth daily.   Yes [provider]  etodolac (LODINE) 200 MG capsule Take 1 capsule (200 mg  total) by mouth 2 (two) times daily. Patient not taking: No sig reported 06/09/18   Duanne Limerick, MD  fluticasone furoate-vilanterol (BREO ELLIPTA) 100-25 MCG/INH AEPB Inhale 1 puff into the lungs in the morning. Patient not taking: No sig reported 02/04/20   Duanne Limerick, MD  metoprolol succinate (TOPROL-XL) 25 MG 24 hr tablet Take 1 tablet (25 mg total) by mouth daily. 04/08/20   Duanne Limerick, MD     ALLERGIES:  No Known Allergies   SOCIAL HISTORY:  Social History   Socioeconomic History  . Marital status: Divorced    Spouse name: Not on file  . Number of children: Not on file  . Years of education: Not on file  . Highest education level: Not on file  Occupational History  . Not on file  Tobacco Use  . Smoking status: Current Every Day Smoker    Packs/day: 0.00    Types: Cigarettes  . Smokeless tobacco: Never Used  . Tobacco comment: gave info on patches and pills  Vaping Use  . Vaping Use: Never used  Substance and Sexual Activity  . Alcohol use: Yes    Alcohol/week: 3.0 standard drinks    Types: 3 Glasses of wine per week  . Drug use: No  . Sexual activity: Yes  Other Topics Concern  . Not on file  Social History Narrative  . Not on file   Social Determinants of Health   Financial Resource Strain: Not on file  Food Insecurity: Not on file  Transportation Needs: Not on file  Physical Activity: Not on file  Stress: Not on file  Social Connections: Not on file  Intimate Partner Violence: Not on file      FAMILY HISTORY:  Family History  Problem Relation Age of Onset  . Diabetes Mother   . Breast cancer Neg Hx      REVIEW OF SYSTEMS:  Constitutional: denies weight loss, fever, chills, or sweats  Eyes: denies any other vision changes, history of eye injury  ENT: denies sore throat, hearing problems  Respiratory: denies shortness of breath, wheezing  Cardiovascular: denies chest pain, palpitations  Gastrointestinal: Positive abdominal pain, nausea  and vomiting Genitourinary: denies burning with urination or urinary frequency Musculoskeletal: denies any other joint pains or cramps  Skin: denies any other rashes or skin discolorations  Neurological: denies any other headache, dizziness, weakness  Psychiatric: denies any other depression, anxiety   All other review of systems were negative   VITAL SIGNS:  Temp:  [97.6 F (36.4 C)-98.8 F (37.1 C)] 98.8 F (37.1 C) (04/29 1129) Pulse Rate:  [70-84] 78 (04/29 1129) Resp:  [16-18] 18 (04/29 1129) BP: (135-174)/(66-91) 145/69 (04/29 1129) SpO2:  [97 %-100 %] 98 % (04/29 1129)     Height: 5\' 1"  (154.9 cm) Weight: 60.8 kg BMI (Calculated): 25.33   INTAKE/OUTPUT:  This shift: No intake/output data recorded.  Last 2 shifts: @IOLAST2SHIFTS @   PHYSICAL EXAM:  Constitutional:  -- Normal body habitus  --  Awake, alert, and oriented x3  Eyes:  -- Pupils equally round and reactive to light  -- No scleral icterus  Ear, nose, and throat:  -- No jugular venous distension  Pulmonary:  -- No crackles  -- Equal breath sounds bilaterally -- Breathing non-labored at rest Cardiovascular:  -- S1, S2 present  -- No pericardial rubs Gastrointestinal:  -- Abdomen soft, nontender, non-distended, no guarding or rebound tenderness -- No abdominal masses appreciated, pulsatile or otherwise  Musculoskeletal and Integumentary:  -- Wounds: None appreciated -- Extremities: B/L UE and LE FROM, hands and feet warm, no edema  Neurologic:  -- Motor function: intact and symmetric -- Sensation: intact and symmetric   Labs:  CBC Latest Ref Rng & Units 06/06/2020 06/05/2020 06/04/2020  WBC 4.0 - 10.5 K/uL 9.5 3.6(L) 3.3(L)  Hemoglobin 12.0 - 15.0 g/dL 10.9(L) 9.8(L) 10.1(L)  Hematocrit 36.0 - 46.0 % 33.0(L) 30.5(L) 30.3(L)  Platelets 150 - 400 K/uL 229 203 221   CMP Latest Ref Rng & Units 06/06/2020 06/05/2020 06/04/2020  Glucose 70 - 99 mg/dL 268(T) 419(Q) 88  BUN 8 - 23 mg/dL 14 13 23   Creatinine  0.44 - 1.00 mg/dL 2.22 9.79  Sodium 135 - 145 mmol/L 140 145 139  Potassium 3.5 - 5.1 mmol/L 3.7 4.1 3.4(L)  Chloride 98 - 111 mmol/L 107 109 104  CO2 22 - 32 mmol/L 21(L) 27 28  Calcium 8.9 - 10.3 mg/dL 8.92) 9.2 8.9  Total Protein 6.5 - 8.1 g/dL 6.9 6.5 6.5  Total Bilirubin 0.3 - 1.2 mg/dL 1.1(H) 0.7 0.9  Alkaline Phos 38 - 126 U/L 128(H) 123 155(H)  AST 15 - 41 U/L 21 32 144(H)  ALT 0 - 44 U/L 61(H) 79(H) 142(H)    Imaging studies:  EXAM: MRI ABDOMEN WITHOUT CONTRAST  (INCLUDING MRCP)  TECHNIQUE: Multiplanar multisequence MR imaging of the abdomen was performed. Heavily T2-weighted images of the biliary and pancreatic ducts were obtained, and three-dimensional MRCP images were rendered by post processing.  COMPARISON:  CT on 06/03/2020 and ultrasound on 10/09/2013  FINDINGS: Lower chest: No acute findings.  Hepatobiliary: No masses visualized on this unenhanced exam. Gallbladder is unremarkable. Mild diffuse biliary ductal dilatation is seen with common bile duct measuring 11 mm. No evidence of choledocholithiasis or biliary stricture.  Pancreas: No mass or inflammatory process visualized on this unenhanced exam. No evidence of pancreatic ductal dilatation or pancreas divisum.  Spleen:  Within normal limits in size.  Adrenals/Urinary tract: Unremarkable. No evidence of nephrolithiasis or hydronephrosis.  Stomach/Bowel: Visualized portion unremarkable.  Vascular/Lymphatic: No pathologically enlarged lymph nodes identified. No evidence of abdominal aortic aneurysm.  Other:  None.  Musculoskeletal:  No suspicious bone lesions identified.  IMPRESSION: Mild diffuse biliary ductal dilatation, with common bile duct measuring 11 mm. No radiographic evidence of choledocholithiasis or other obstructing etiology.   Electronically Signed   By: 12/09/2013 M.D.   On: 06/03/2020 18:55  Assessment/Plan:  60 y.o. female with choledocholithiasis  status post ERCP, complicated by pertinent comorbidities including hyperlipidemia, hypertension, tobacco use.  Patient with choledocholithiasis status post ERCP.  Today with elevated lipase but without abdominal pain.  We will follow-up trend of lipase.  I discussed with the patient the recommendation of having cholecystectomy before discharge.  I discussed with the patient the risks of cholecystectomy that includes injury to common bile duct, infection, bleeding, retained stone, need to drain, abnormalities.  The recommendation of cholecystectomy is to avoid recurrence of choledocholithiasis and the complications from the choledocholithiasis.  Discuss with patient to stay NPO after midnight and will repeat lipase in the morning. Plan to proceed with cholecystectomy when pancreatitis resolve before discharge.   Gae Gallop, MD

## 2020-06-06 NOTE — Progress Notes (Signed)
PROGRESS NOTE    Samantha Irwin  ZOX:[id number] DOB: 07-18-1960 DOA: 06/03/2020 PCP: Duanne Limerick, MD    Chief Complaint  Patient presents with  . Abdominal Pain    Brief Narrative:  Patient 60 year old female history of hyperlipidemia, hypertension, daily alcohol use, tobacco use, postmenopausal presented to the ED with concerns of abdominal pain.  Patient noted to have a transaminitis.  CT abdomen and pelvis was done.  MRCP done.  GI consulted for further evaluation and management.   Assessment & Plan:   Principal Problem:   Elevated LFTs Active Problems:   Post-ERCP acute pancreatitis   Essential hypertension   Chronic obstructive pulmonary disease (HCC)   Familial hypercholesterolemia   Cigarette nicotine dependence without complication   Alcohol abuse   Abdominal pain   Hypokalemia   Iron deficiency anemia due to chronic blood loss   Gastritis without bleeding   Elevated liver enzymes   Abnormal magnetic resonance imaging of liver   Gastric ulcer: Per ERCP 06/05/2020   Angiodysplasia of stomach and duodenum: Per EGD 06/05/2020  1 abdominal pain/transaminitis likely secondary to choledocholithiasis -Patient presented with abdominal pain noted to have a transaminitis with a AST to ALT ratio of 2:1. -Patient with history of daily alcohol use, currently on statin. -Clinical improvement however still with some diffuse abdominal discomfort.  -CT abdomen and pelvis with no evidence of bowel obstruction or bowel pathology, diverticulosis without evidence of diverticulitis, somewhat prominent common bile duct measuring up to 9 mm in diameter but no obstructing lesion in the distal duct or ampullary region noted. -MRCP done with mild diffuse biliary ductal dilatation with CBD measuring 11 mm, no radiographic evidence of choledocholithiasis or other obstructing etiology. -LFTs trending down quickly. -Concern for possible passed stone. -Acute hepatitis panel negative.   -Patient seen in consultation by GI status post EGD(06/05/2020) with gastritis, angiodysplasia status post APC x3.   -ERCP done (06/05/2020) with biliary sphincterotomy and extraction of biliary sludge.  -General surgery consultation to evaluate for cholecystectomy.  -GI following and appreciate input and recommendations.  2.  Post ERCP pancreatitis -Patient post ERCP with nausea, vomiting, epigastric and right upper quadrant abdominal pain with inability to keep anything down. -Lipase levels noted this morning elevated at 1035 -Patient made n.p.o. this morning except for ice chips with sips of meds. -2 L normal saline fluid bolus x1 and increase IV fluids to normal saline at 150 cc an hour. -Bowel rest, pain management, supportive care.  3.  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 -Likely secondary to chronic alcohol use and likely etiology of patient's anemia. -Alcohol cessation stressed to patient. -Change PPI to IV PPI twice daily secondary to acute pancreatitis. -GI following.   4.  Alcohol abuse -Patient states drinks about 3 to 4 glasses of wine per day. -No signs of withdrawal.  -Alcohol cessation stressed to patient. -Continue thiamine, folic acid, multivitamin.   -Ativan withdrawal protocol.   5.  Normocytic anemia/ -Patient with no overt bleeding. -Anemia panel consistent with anemia of chronic disease. -Vitamin B12 level at 469. -We will likely need vitamin B12 supplementation on discharge.   -Patient underwent upper endoscopy noted to have gastritis, nonbleeding ulcer, angiodysplasia status post APC.   -Change oral PPI to IV PPI every 12 hours.   -Follow H&H.   -Transfusion threshold hemoglobin < 7.   6.  Hypokalemia -Repleted.  Potassium of 4.1.    7.  Hyperlipidemia -Continue to hold statin secondary to  transaminitis.    8.  Hypertension Patient with post ERCP pancreatitis.  Discontinue HCTZ and  metoprolol.  Placed on IV Lopressor for blood pressure control.  Follow.      DVT prophylaxis: Lovenox Code Status: Full Family Communication: Updated patient and daughters at bedside. Disposition:   Status is: Observation    Dispo: The patient is from: Home              Anticipated d/c is to: Home              Patient currently with post ERCP pancreatitis, currently n.p.o., on IV fluids, choledocholithiasis, general surgery evaluation pending.  Not stable for discharge.    Difficult to place patient no       Consultants:   Gastroenterology: Dr. Allegra Lai 06/03/2020  General surgery pending  Procedures:   CT abdomen and pelvis 06/03/2020  MRCP 06/03/2020  EGD/ERCP 06/05/2020 per Dr. Servando Snare  Antimicrobials:   None   Subjective: Sitting up in bed.  Daughter is at bedside.  Patient stated she had a rough night yesterday with nausea, vomiting, significant abdominal pain.  Patient endorses nausea and vomiting and abdominal pain with oral intake including chewing gum.   Patient denies any bleeding.  No chest pain.  No shortness of breath.  Objective: Vitals:   06/05/20 1945 06/05/20 2337 06/06/20 0736 06/06/20 1129  BP: (!) 159/79 (!) 144/66 135/69 (!) 145/69  Pulse: 74 75 84 78  Resp: 17 16 16 18   Temp: 97.6 F (36.4 C) 98.2 F (36.8 C) 98.1 F (36.7 C) 98.8 F (37.1 C)  TempSrc:      SpO2: 100% 100% 97% 98%  Weight:      Height:        Intake/Output Summary (Last 24 hours) at 06/06/2020 1257 Last data filed at 06/06/2020 0400 Gross per 24 hour  Intake 1050 ml  Output 150 ml  Net 900 ml   Filed Weights   06/03/20 1257  Weight: 60.8 kg    Examination:  General exam: : NAD Respiratory system: Lungs clear to auscultation bilaterally.  No wheezes, no crackles, no rhonchi.  Speaking in full sentences.  Normal respiratory effort.  Cardiovascular system: RRR no murmurs rubs or gallops.  No JVD.  No lower extremity edema.  Gastrointestinal system: Abdomen is  soft, tender to palpation in the epigastrium and right upper quadrant.  Positive bowel sounds.  No rebound.  No guarding.  Central nervous system: Alert and oriented. No focal neurological deficits. Extremities: Symmetric 5 x 5 power. Skin: No rashes, lesions or ulcers Psychiatry: Judgement and insight appear normal. Mood & affect appropriate.    Data Reviewed: I have personally reviewed following labs and imaging studies  CBC: Recent Labs  Lab 06/03/20 1318 06/04/20 0403 06/05/20 0455 06/06/20 0448  WBC 5.4 3.3* 3.6* 9.5  NEUTROABS  --   --  1.8  --   HGB 10.6* 10.1* 9.8* 10.9*  HCT 32.7* 30.3* 30.5* 33.0*  MCV 91.6 91.0 92.1 91.7  PLT 253 221 203 229    Basic Metabolic Panel: Recent Labs  Lab 06/03/20 1318 06/03/20 2305 06/04/20 0403 06/05/20 0455 06/06/20 0448  NA 136 135 139 145 140  K 3.2* 3.2* 3.4* 4.1 3.7  CL 99 100 104 109 107  CO2 24 25 28 27  21*  GLUCOSE 111* 143* 88 110* 109*  BUN 22 25* 23 13 14   CREATININE 0.86 1.22* 0.94 0.87 0.86  CALCIUM 9.2 8.8* 8.9 9.2 8.8*  MG  --  1.7 1.9 2.0  --   PHOS  --  4.4  --   --   --     GFR: Estimated Creatinine Clearance: 57.5 mL/min (by C-G formula based on SCr of 0.86 mg/dL).  Liver Function Tests: Recent Labs  Lab 06/03/20 1318 06/03/20 2305 06/04/20 0403 06/05/20 0455 06/06/20 0448  AST 296* 288* 144* 32 21  ALT 88* 167* 142* 79* 61*  ALKPHOS 130* 158* 155* 123 128*  BILITOT 1.1 1.3* 0.9 0.7 1.3*  PROT 7.4 6.6 6.5 6.5 6.9  ALBUMIN 4.0 3.5 3.5 3.6 3.7    CBG: No results for input(s): GLUCAP in the last 168 hours.   Recent Results (from the past 240 hour(s))  SARS CORONAVIRUS 2 (TAT 6-24 HRS) Nasopharyngeal Nasopharyngeal Swab     Status: None   Collection Time: 06/03/20  4:08 PM   Specimen: Nasopharyngeal Swab  Result Value Ref Range Status   SARS Coronavirus 2 NEGATIVE NEGATIVE Final    Comment: (NOTE) SARS-CoV-2 target nucleic acids are NOT DETECTED.  The SARS-CoV-2 RNA is generally  detectable in upper and lower respiratory specimens during the acute phase of infection. Negative results do not preclude SARS-CoV-2 infection, do not rule out co-infections with other pathogens, and should not be used as the sole basis for treatment or other patient management decisions. Negative results must be combined with clinical observations, patient history, and epidemiological information. The expected result is Negative.  Fact Sheet for Patients: HairSlick.no  Fact Sheet for Healthcare Providers: quierodirigir.com  This test is not yet approved or cleared by the [place] FDA and  has been authorized for detection and/or diagnosis of SARS-CoV-2 by FDA under an Emergency Use Authorization (EUA). This EUA will remain  in effect (meaning this test can be used) for the duration of the COVID-19 declaration under Se ction 564(b)(1) of the Act, 21 U.S.C. section 360bbb-3(b)(1), unless the authorization is terminated or revoked sooner.  Performed at [facility], [address], [place], [place] [address]          Radiology Studies: DG C-Arm 1-60 Min-No Report  Result Date: 06/05/2020 Fluoroscopy was utilized by the requesting physician.  No radiographic interpretation.        Scheduled Meds: . cholecalciferol  1,000 Units Oral Daily  . enoxaparin (LOVENOX) injection  40 mg Subcutaneous Q2200  . fluticasone furoate-vilanterol  1 puff Inhalation Daily  . metoprolol tartrate  5 mg Intravenous Q8H  . oxybutynin  5 mg Oral QHS  . pantoprazole (PROTONIX) IV  40 mg Intravenous Q12H  . thiamine  100 mg Oral Daily   Or  . thiamine  100 mg Intravenous Daily   Continuous Infusions: . sodium chloride 125 mL/hr at 06/05/20 1738  . folic acid (FOLVITE) IVPB 1 mg (06/06/20 1141)     LOS: 1 day    Time spent: 40 minutes    Ramiro Harvest, MD [facility]   To contact the attending provider  between 7A-7P or the covering provider during after hours 7P-7A, please log into the web site [email or web address] and access using universal [place] password for that web site. If you do not have the password, please call the hospital operator.  06/06/2020, 12:57 PM

## 2020-06-06 NOTE — Plan of Care (Signed)
Shift Summary: Pt with continued abdominal pain and N/V overnight, to the point when placing a piece of gum in mouth pt began vomiting. NS infusing per MAR. PRN morphine and zofran utilized per [facility]. Ambulatory to bathroom, adequate UO. Refused PO medications overnight d/t nausea and inability to take PO at this time. Fall/safety precautions in place, rounding performed, needs/concerns addressed during shift.   Problem: Education: Goal: Knowledge of General Education information will improve Description: Including pain rating scale, medication(s)/side effects and non-pharmacologic comfort measures Outcome: Progressing   Problem: Health Behavior/Discharge Planning: Goal: Ability to manage health-related needs will improve Outcome: Progressing   Problem: Clinical Measurements: Goal: Ability to maintain clinical measurements within normal limits will improve Outcome: Progressing Goal: Will remain free from infection Outcome: Progressing Goal: Diagnostic test results will improve Outcome: Progressing Goal: Respiratory complications will improve Outcome: Progressing Goal: Cardiovascular complication will be avoided Outcome: Progressing   Problem: Nutrition: Goal: Adequate nutrition will be maintained Outcome: Progressing   Problem: Coping: Goal: Level of anxiety will decrease Outcome: Progressing   Problem: Elimination: Goal: Will not experience complications related to bowel motility Outcome: Progressing Goal: Will not experience complications related to urinary retention Outcome: Progressing   Problem: Pain Managment: Goal: General experience of comfort will improve Outcome: Progressing   Problem: Safety: Goal: Ability to remain free from injury will improve Outcome: Progressing   Problem: Skin Integrity: Goal: Risk for impaired skin integrity will decrease Outcome: Progressing

## 2020-06-06 NOTE — TOC Progression Note (Signed)
Transition of Care [facility]) - Progression Note    Patient Details  Name: Yolande Skoda MRN: [id number] Date of Birth: [birth year]  Transition of Care [facility]) CM/SW Contact  Shelbie Hutching, RN Phone Number: 06/06/2020, 3:27 PM  Clinical Narrative:    RNCM met with patient at the bedside, patient's daughter, Lexine Baton, is also at the bedside.  Patient gives permission to speak in front of her daughter.  Patient is from home and independent at home.  She works for [employer].   Patient verbalizes that she will abstain from alcohol, as recommended by physicians, and that this should not be difficult for her.   RNCM did provide patient with resources on substance abuse and cone financial assistance, as they are worried about medical costs.  No other needs identified at this time.   Expected Discharge Plan: Home/Self Care Barriers to Discharge: Continued Medical Work up  Expected Discharge Plan and Services Expected Discharge Plan: Home/Self Care   Discharge Planning Services: CM Consult   Living arrangements for the past 2 months: Single Family Home                 DME Arranged: N/A DME Agency: NA       HH Arranged: NA           Social Determinants of Health (SDOH) Interventions    Readmission Risk Interventions No flowsheet data found.

## 2020-06-06 NOTE — Progress Notes (Signed)
Samantha Darby, MD [address]  [place]  [place], [place] [address]  Main: [phone]  Fax: [phone] Pager: [phone]   Subjective: Patient underwent EGD and ERCP yesterday with biliary sphincterotomy and bile duct stone extraction.  She was also found to have peptic ulcer as well as AVMs which were treated.  After the procedure, she developed severe right upper quadrant pain, nausea and vomiting after she had Jell-O and gum.  She was kept n.p.o., started on IV fluids.  Her serum lipase was greater than 1000.  Patient denied any fever, chills.  When I saw the patient, her children were in the room.  Patient reports that her symptoms have significantly improved, does report mild epigastric discomfort.  She denies any nausea or vomiting.  Currently on ice chips only.  Her LFTs are fairly stable, mildly elevated alkaline phosphatase and total bilirubin  Hepatic Function Latest Ref Rng & Units 06/06/2020 06/05/2020 06/04/2020  Total Protein 6.5 - 8.1 g/dL 6.9 6.5 6.5  Albumin 3.5 - 5.0 g/dL 3.7 3.6 3.5  AST 15 - 41 U/L 21 32 144(H)  ALT 0 - 44 U/L 61(H) 79(H) 142(H)  Alk Phosphatase 38 - 126 U/L 128(H) 123 155(H)  Total Bilirubin 0.3 - 1.2 mg/dL 1.3(H) 0.7 0.9  Bilirubin, Direct 0.0 - 0.2 mg/dL - <0.1 <0.1     Objective: Vital signs in last 24 hours: Vitals:   06/05/20 1945 06/05/20 2337 06/06/20 0736 06/06/20 1129  BP: (!) 159/79 (!) 144/66 135/69 (!) 145/69  Pulse: 74 75 84 78  Resp: _0 Temp: 97.6 F (36.4 C) 98.2 F (36.8 C) 98.1 F (36.7 C) 98.8 F (37.1 C)  TempSrc:      SpO2: 100% 100% 97% 98%  Weight:      Height:       Weight change:   Intake/Output Summary (Last 24 hours) at 06/06/2020 1405 Last data filed at 06/06/2020 0400 Gross per 24 hour  Intake 1050 ml  Output 150 ml  Net 900 ml     Exam: Heart:: Regular rate and rhythm, S1S2 present or without murmur or extra heart sounds Lungs: normal and clear to auscultation Abdomen: Mild  epigastric discomfort, soft   Lab Results: CBC Latest Ref Rng & Units 06/06/2020 06/05/2020 06/04/2020  WBC 4.0 - 10.5 K/uL 9.5 3.6(L) 3.3(L)  Hemoglobin 12.0 - 15.0 g/dL 10.9(L) 9.8(L) 10.1(L)  Hematocrit 36.0 - 46.0 % 33.0(L) 30.5(L) 30.3(L)  Platelets 150 - 400 K/uL 229 203 221    Micro Results: Recent Results (from the past 240 hour(s))  SARS CORONAVIRUS 2 (TAT 6-24 HRS) Nasopharyngeal Nasopharyngeal Swab     Status: None   Collection Time: 06/03/20  4:08 PM   Specimen: Nasopharyngeal Swab  Result Value Ref Range Status   SARS Coronavirus 2 NEGATIVE NEGATIVE Final    Comment: (NOTE) SARS-CoV-2 target nucleic acids are NOT DETECTED.  The SARS-CoV-2 RNA is generally detectable in upper and lower respiratory specimens during the acute phase of infection. Negative results do not preclude SARS-CoV-2 infection, do not rule out co-infections with other pathogens, and should not be used as the sole basis for treatment or other patient management decisions. Negative results must be combined with clinical observations, patient history, and epidemiological information. The expected result is Negative.  Fact Sheet for Patients: SugarRoll.be  Fact Sheet for Healthcare Providers: https://www.woods-mathews.com/  This test is not yet approved or cleared by the [place] FDA and  has been authorized for  detection and/or diagnosis of SARS-CoV-2 by FDA under an Emergency Use Authorization (EUA). This EUA will remain  in effect (meaning this test can be used) for the duration of the COVID-19 declaration under Se ction 564(b)(1) of the Act, 21 U.S.C. section 360bbb-3(b)(1), unless the authorization is terminated or revoked sooner.  Performed at [facility], [place] [address] [place], [place] [address]    Studies/Results: DG C-Arm 1-60 Min-No Report  Result Date: 06/05/2020 Fluoroscopy was utilized by the requesting physician.  No  radiographic interpretation.   Medications:  I have reviewed the patient's current medications. Prior to Admission:  Facility-Administered Medications Prior to Admission  Medication Dose Route Frequency Provider Last Rate Last Admin  . ipratropium-albuterol (DUONEB) 0.5-2.5 (3) MG/3ML nebulizer solution 3 mL  3 mL Nebulization Once Juline Patch, MD       Medications Prior to Admission  Medication Sig Dispense Refill Last Dose  . albuterol (VENTOLIN HFA) 108 (90 Base) MCG/ACT inhaler Inhale 2 puffs into the lungs every 6 (six) hours as needed for wheezing or shortness of breath. 1 Inhaler 11 prn at prn  . atorvastatin (LIPITOR) 10 MG tablet Take 1 tablet (10 mg total) by mouth daily. 90 tablet 1 06/03/2020 at 0630  . B Complex Vitamins (B COMPLEX PO) Take by mouth daily.   06/03/2020 at 0630  . Biotin 10 MG TABS Take 1 tablet by mouth daily.   06/03/2020 at 0630  . carbamide peroxide (DEBROX) 6.5 % OTIC solution Place 5 drops into both ears 2 (two) times daily. 15 mL 0 prn at prn  . cholecalciferol (VITAMIN D) 1000 UNITS tablet Take 1,000 Units by mouth daily.   06/03/2020 at 0630  . diclofenac Sodium (VOLTAREN) 1 % GEL Apply 2 g topically 4 (four) times daily. 150 g 0 prn at prn  . hydrochlorothiazide (HYDRODIURIL) 12.5 MG tablet Take 1 tablet (12.5 mg total) by mouth daily. 90 tablet 1 06/03/2020 at 0630  . Multiple Vitamins-Minerals (MULTIVITAMIN WOMEN 50+ PO) Take 1 tablet by mouth daily.   06/03/2020 at 0630  . Omega-3 Fatty Acids (FISH OIL) 1000 MG CAPS Take 1 capsule (1,000 mg total) by mouth daily. 100 capsule 3 06/03/2020 at 0630  . oxybutynin (DITROPAN-XL) 5 MG 24 hr tablet Take 1 tablet (5 mg total) by mouth at bedtime. 90 tablet 1 prn at prn  . triamcinolone ointment (KENALOG) 0.5 % Apply 1 application topically 2 (two) times daily. 30 g 0 prn at prn  . vitamin C (ASCORBIC ACID) 250 MG tablet Take 250 mg by mouth daily.   06/03/2020 at 0630  . etodolac (LODINE) 200 MG capsule Take 1  capsule (200 mg total) by mouth 2 (two) times daily. (Patient not taking: No sig reported) 30 capsule 1 Not Taking at Unknown time  . fluticasone furoate-vilanterol (BREO ELLIPTA) 100-25 MCG/INH AEPB Inhale 1 puff into the lungs in the morning. (Patient not taking: No sig reported) 28 each 5 Not Taking at Unknown time  . metoprolol succinate (TOPROL-XL) 25 MG 24 hr tablet Take 1 tablet (25 mg total) by mouth daily. 90 tablet 1    Scheduled: . cholecalciferol  1,000 Units Oral Daily  . enoxaparin (LOVENOX) injection  40 mg Subcutaneous Q2200  . fluticasone furoate-vilanterol  1 puff Inhalation Daily  . metoprolol tartrate  5 mg Intravenous Q8H  . oxybutynin  5 mg Oral QHS  . pantoprazole (PROTONIX) IV  40 mg Intravenous Q12H  . thiamine  100 mg Oral Daily  Or  . thiamine  100 mg Intravenous Daily   Continuous: . sodium chloride 150 mL/hr at 06/06/20 1320  . folic acid (FOLVITE) IVPB 1 mg (06/06/20 1141)   PJK:DTOIZTIWP, LORazepam **OR** LORazepam, melatonin, morphine injection, ondansetron **OR** ondansetron (ZOFRAN) IV, prochlorperazine Anti-infectives (From admission, onward)   None     Scheduled Meds: . cholecalciferol  1,000 Units Oral Daily  . enoxaparin (LOVENOX) injection  40 mg Subcutaneous Q2200  . fluticasone furoate-vilanterol  1 puff Inhalation Daily  . metoprolol tartrate  5 mg Intravenous Q8H  . oxybutynin  5 mg Oral QHS  . pantoprazole (PROTONIX) IV  40 mg Intravenous Q12H  . thiamine  100 mg Oral Daily   Or  . thiamine  100 mg Intravenous Daily   Continuous Infusions: . sodium chloride 150 mL/hr at 06/06/20 1320  . folic acid (FOLVITE) IVPB 1 mg (06/06/20 1141)   PRN Meds:.albuterol, LORazepam **OR** LORazepam, melatonin, morphine injection, ondansetron **OR** ondansetron (ZOFRAN) IV, prochlorperazine   Assessment: Principal Problem:   Elevated LFTs Active Problems:   Essential hypertension   Chronic obstructive pulmonary disease (HCC)   Familial  hypercholesterolemia   Cigarette nicotine dependence without complication   Alcohol abuse   Abdominal pain   Hypokalemia   Iron deficiency anemia due to chronic blood loss   Gastritis without bleeding   Elevated liver enzymes   Abnormal magnetic resonance imaging of liver   Gastric ulcer: Per ERCP 06/05/2020   Angiodysplasia of stomach and duodenum: Per EGD 06/05/2020   Post-ERCP acute pancreatitis   Plan: Dilated CBD secondary to choledocholithiasis s/p ERCP on 4/28 with biliary sphincterotomy and extraction of biliary sludge Patient developed post ERCP pancreatitis, currently doing well with minimal symptoms Continue maintenance IV fluids 150 mL/h for next 24 hours Can start clear liquids later in the day or tomorrow Recommend general surgery consultation to evaluate for cholecystectomy  Pyloric ulcer, clean-based as well as gastric, duodenal AVMs s/p APC Secondary to alcohol use, cause of anemia Recommend complete abstinence from alcohol use H. pylori breath test is negative Recommend Prilosec 40 mg 1-2 times daily for 2 months and then stop upon discharge  Dr. Allen Norris is covering for the weekend   LOS: 1 day   Samantha Irwin 06/06/2020, 2:05 PM

## 2020-06-07 ENCOUNTER — Inpatient Hospital Stay: Payer: BC Managed Care – PPO | Admitting: Anesthesiology

## 2020-06-07 ENCOUNTER — Encounter
Admission: EM | Disposition: A | Discharge status: Home / Self Care | Payer: Self-pay | Source: Home / Self Care | Attending: Internal Medicine

## 2020-06-07 LAB — COMPREHENSIVE METABOLIC PANEL
ALT: 45 U/L — ABNORMAL HIGH (ref 0–44)
AST: 22 U/L (ref 15–41)
Albumin: 3.1 g/dL — ABNORMAL LOW (ref 3.5–5.0)
Alkaline Phosphatase: 114 U/L (ref 38–126)
Anion gap: 8 (ref 5–15)
BUN: 16 mg/dL (ref 8–23)
CO2: 19 mmol/L — ABNORMAL LOW (ref 22–32)
Calcium: 7.8 mg/dL — ABNORMAL LOW (ref 8.9–10.3)
Chloride: 111 mmol/L (ref 98–111)
Creatinine, Ser: 0.81 mg/dL (ref 0.44–1.00)
GFR, Estimated: >60 mL/min (ref >60)
Glucose, Bld: 62 mg/dL — ABNORMAL LOW (ref 70–99)
Potassium: 3.1 mmol/L — ABNORMAL LOW (ref 3.5–5.1)
Sodium: 138 mmol/L (ref 135–145)
Total Bilirubin: 1.2 mg/dL (ref 0.3–1.2)
Total Protein: 6.1 g/dL — ABNORMAL LOW (ref 6.5–8.1)

## 2020-06-07 LAB — CBC WITH DIFFERENTIAL/PLATELET
Abs Immature Granulocytes: 0.02 10*3/uL (ref 0.00–0.07)
Basophils Absolute: 0 10*3/uL (ref 0.0–0.1)
Basophils Relative: 0 %
Eosinophils Absolute: 0.2 10*3/uL (ref 0.0–0.5)
Eosinophils Relative: 5 %
HCT: 28.9 % — ABNORMAL LOW (ref 36.0–46.0)
Hemoglobin: 9.4 g/dL — ABNORMAL LOW (ref 12.0–15.0)
Immature Granulocytes: 0 %
Lymphocytes Relative: 20 %
Lymphs Abs: 0.9 10*3/uL (ref 0.7–4.0)
MCH: 30.1 pg (ref 26.0–34.0)
MCHC: 32.5 g/dL (ref 30.0–36.0)
MCV: 92.6 fL (ref 80.0–100.0)
Monocytes Absolute: 0.5 10*3/uL (ref 0.1–1.0)
Monocytes Relative: 10 %
Neutro Abs: 3 10*3/uL (ref 1.7–7.7)
Neutrophils Relative %: 65 %
Platelets: 170 10*3/uL (ref 150–400)
RBC: 3.12 MIL/uL — ABNORMAL LOW (ref 3.87–5.11)
RDW: 11.7 % (ref 11.5–15.5)
WBC: 4.7 10*3/uL (ref 4.0–10.5)
nRBC: 0 % (ref 0.0–0.2)

## 2020-06-07 LAB — MAGNESIUM: Magnesium: 1.6 mg/dL — ABNORMAL LOW (ref 1.7–2.4)

## 2020-06-07 LAB — LIPASE, BLOOD: Lipase: 132 U/L — ABNORMAL HIGH (ref 11–51)

## 2020-06-07 SURGERY — CHOLECYSTECTOMY, ROBOT-ASSISTED, LAPAROSCOPIC
Anesthesia: General | Site: Abdomen

## 2020-06-07 MED ORDER — PROPOFOL 10 MG/ML IV BOLUS
INTRAVENOUS | Status: DC | PRN
  Administered 2020-06-07: 150 mg via INTRAVENOUS

## 2020-06-07 MED ORDER — LIDOCAINE HCL (CARDIAC) PF 100 MG/5ML IV SOSY
PREFILLED_SYRINGE | INTRAVENOUS | Status: DC | PRN
  Administered 2020-06-07: 80 mg via INTRAVENOUS

## 2020-06-07 MED ORDER — EPHEDRINE SULFATE 50 MG/ML IJ SOLN
INTRAMUSCULAR | Status: DC | PRN
  Administered 2020-06-07: 10 mg via INTRAVENOUS

## 2020-06-07 MED ORDER — ONDANSETRON HCL 4 MG/2ML IJ SOLN: 4.0000 mg | Freq: Once | INTRAMUSCULAR | Status: DC | PRN

## 2020-06-07 MED ORDER — ROCURONIUM BROMIDE 10 MG/ML (PF) SYRINGE
PREFILLED_SYRINGE | INTRAVENOUS | Status: AC
  Filled 2020-06-07: qty 10

## 2020-06-07 MED ORDER — CEFAZOLIN (ANCEF) 1 G IV SOLR
1.0000 g | INTRAVENOUS | Status: DC
  Filled 2020-06-07: qty 1

## 2020-06-07 MED ORDER — FENTANYL CITRATE (PF) 250 MCG/5ML IJ SOLN
INTRAMUSCULAR | Status: AC
  Filled 2020-06-07: qty 5

## 2020-06-07 MED ORDER — METOPROLOL SUCCINATE ER 25 MG PO TB24
25.0000 mg | ORAL_TABLET | Freq: Every day | ORAL | Status: DC
  Administered 2020-06-07 – 2020-06-08 (×2): 25 mg via ORAL
  Filled 2020-06-07 (×2): qty 1

## 2020-06-07 MED ORDER — POTASSIUM CHLORIDE CRYS ER 20 MEQ PO TBCR
20.0000 meq | EXTENDED_RELEASE_TABLET | Freq: Once | ORAL | Status: DC
  Filled 2020-06-07: qty 1

## 2020-06-07 MED ORDER — BUPIVACAINE-EPINEPHRINE 0.25% -1:200000 IJ SOLN
INTRAMUSCULAR | Status: DC | PRN
  Administered 2020-06-07: 30 mL

## 2020-06-07 MED ORDER — PROPOFOL 10 MG/ML IV BOLUS
INTRAVENOUS | Status: AC
  Filled 2020-06-07: qty 20

## 2020-06-07 MED ORDER — LABETALOL HCL 5 MG/ML IV SOLN
INTRAVENOUS | Status: AC
  Filled 2020-06-07: qty 4

## 2020-06-07 MED ORDER — SUGAMMADEX SODIUM 200 MG/2ML IV SOLN
INTRAVENOUS | Status: DC | PRN
  Administered 2020-06-07: 200 mg via INTRAVENOUS

## 2020-06-07 MED ORDER — INDOCYANINE GREEN 25 MG IV SOLR
1.2500 mg | Freq: Once | INTRAVENOUS | Status: AC
  Administered 2020-06-07: 1.25 mg via INTRAVENOUS
  Filled 2020-06-07: qty 0.5

## 2020-06-07 MED ORDER — MIDAZOLAM HCL 2 MG/2ML IJ SOLN
INTRAMUSCULAR | Status: DC | PRN
  Administered 2020-06-07: 2 mg via INTRAVENOUS

## 2020-06-07 MED ORDER — LIDOCAINE HCL (PF) 2 % IJ SOLN
INTRAMUSCULAR | Status: AC
  Filled 2020-06-07: qty 5

## 2020-06-07 MED ORDER — ONDANSETRON HCL 4 MG/2ML IJ SOLN
INTRAMUSCULAR | Status: DC | PRN
  Administered 2020-06-07: 4 mg via INTRAVENOUS

## 2020-06-07 MED ORDER — ROCURONIUM BROMIDE 100 MG/10ML IV SOLN
INTRAVENOUS | Status: DC | PRN
  Administered 2020-06-07: 50 mg via INTRAVENOUS

## 2020-06-07 MED ORDER — FENTANYL CITRATE (PF) 100 MCG/2ML IJ SOLN
INTRAMUSCULAR | Status: AC
  Filled 2020-06-07: qty 2

## 2020-06-07 MED ORDER — DEXAMETHASONE SODIUM PHOSPHATE 10 MG/ML IJ SOLN
INTRAMUSCULAR | Status: DC | PRN
  Administered 2020-06-07: 4 mg via INTRAVENOUS

## 2020-06-07 MED ORDER — LACTATED RINGERS IV SOLN: INTRAVENOUS | Status: DC | PRN

## 2020-06-07 MED ORDER — POTASSIUM CHLORIDE CRYS ER 20 MEQ PO TBCR
40.0000 meq | EXTENDED_RELEASE_TABLET | ORAL | Status: AC
  Administered 2020-06-07: 10:00:00 40 meq via ORAL
  Filled 2020-06-07: qty 2

## 2020-06-07 MED ORDER — FENTANYL CITRATE (PF) 100 MCG/2ML IJ SOLN
INTRAMUSCULAR | Status: DC | PRN
  Administered 2020-06-07: 100 ug via INTRAVENOUS
  Administered 2020-06-07 (×3): 50 ug via INTRAVENOUS

## 2020-06-07 MED ORDER — DEXAMETHASONE SODIUM PHOSPHATE 10 MG/ML IJ SOLN
INTRAMUSCULAR | Status: AC
  Filled 2020-06-07: qty 1

## 2020-06-07 MED ORDER — MAGNESIUM SULFATE 4 GM/100ML IV SOLN
4.0000 g | Freq: Once | INTRAVENOUS | Status: AC
  Administered 2020-06-07: 11:00:00 4 g via INTRAVENOUS
  Filled 2020-06-07: qty 100

## 2020-06-07 MED ORDER — POTASSIUM CHLORIDE CRYS ER 20 MEQ PO TBCR
40.0000 meq | EXTENDED_RELEASE_TABLET | Freq: Once | ORAL | Status: AC
  Administered 2020-06-07: 40 meq via ORAL
  Filled 2020-06-07: qty 2

## 2020-06-07 MED ORDER — MORPHINE SULFATE (PF) 4 MG/ML IV SOLN: 4.0000 mg | INTRAVENOUS | Status: DC | PRN

## 2020-06-07 MED ORDER — FENTANYL CITRATE (PF) 100 MCG/2ML IJ SOLN
25.0000 ug | INTRAMUSCULAR | Status: DC | PRN
  Administered 2020-06-07: 25 ug via INTRAVENOUS

## 2020-06-07 MED ORDER — HYDROCODONE-ACETAMINOPHEN 5-325 MG PO TABS
1.0000 | ORAL_TABLET | Freq: Four times a day (QID) | ORAL | Status: DC | PRN
  Administered 2020-06-07: 1 via ORAL
  Filled 2020-06-07: qty 1

## 2020-06-07 MED ORDER — METOPROLOL TARTRATE 5 MG/5ML IV SOLN
INTRAVENOUS | Status: DC | PRN
  Administered 2020-06-07: 2.5 mg via INTRAVENOUS

## 2020-06-07 MED ORDER — MIDAZOLAM HCL 2 MG/2ML IJ SOLN
INTRAMUSCULAR | Status: AC
  Filled 2020-06-07: qty 2

## 2020-06-07 MED ORDER — ONDANSETRON HCL 4 MG/2ML IJ SOLN
INTRAMUSCULAR | Status: AC
  Filled 2020-06-07: qty 2

## 2020-06-07 MED ORDER — CEFAZOLIN SODIUM-DEXTROSE 2-3 GM-%(50ML) IV SOLR
INTRAVENOUS | Status: DC | PRN
  Administered 2020-06-07: 1 g via INTRAVENOUS

## 2020-06-07 SURGICAL SUPPLY — 50 items
BAG INFUSER PRESSURE 100CC (MISCELLANEOUS) IMPLANT
BLADE SURG SZ11 CARB STEEL (BLADE) ×2 IMPLANT
CANISTER SUCT 1200ML W/VALVE (MISCELLANEOUS) ×2 IMPLANT
CANNULA REDUC XI 12-8 STAPL (CANNULA) ×1
CANNULA REDUCER 12-8 DVNC XI (CANNULA) ×1 IMPLANT
CHLORAPREP W/TINT 26 (MISCELLANEOUS) ×2 IMPLANT
CLIP VESOLOCK MED LG 6/CT (CLIP) ×2 IMPLANT
COVER WAND RF STERILE (DRAPES) ×2 IMPLANT
DECANTER SPIKE VIAL GLASS SM (MISCELLANEOUS) ×4 IMPLANT
DEFOGGER SCOPE WARMER CLEARIFY (MISCELLANEOUS) ×2 IMPLANT
DERMABOND ADVANCED (GAUZE/BANDAGES/DRESSINGS) ×1
DERMABOND ADVANCED .7 DNX12 (GAUZE/BANDAGES/DRESSINGS) ×1 IMPLANT
DRAPE ARM DVNC X/XI (DISPOSABLE) ×4 IMPLANT
DRAPE COLUMN DVNC XI (DISPOSABLE) ×1 IMPLANT
DRAPE DA VINCI XI ARM (DISPOSABLE) ×4
DRAPE DA VINCI XI COLUMN (DISPOSABLE) ×1
ELECT REM PT RETURN 9FT ADLT (ELECTROSURGICAL) ×2
ELECTRODE REM PT RTRN 9FT ADLT (ELECTROSURGICAL) ×1 IMPLANT
GLOVE SURG ENC MOIS LTX SZ6.5 (GLOVE) ×4 IMPLANT
GLOVE SURG UNDER POLY LF SZ6.5 (GLOVE) ×4 IMPLANT
GOWN STRL REUS W/ TWL LRG LVL3 (GOWN DISPOSABLE) ×3 IMPLANT
GOWN STRL REUS W/TWL LRG LVL3 (GOWN DISPOSABLE) ×3
GRASPER SUT TROCAR 14GX15 (MISCELLANEOUS) ×2 IMPLANT
IRRIGATOR SUCT 8 DISP DVNC XI (IRRIGATION / IRRIGATOR) IMPLANT
IRRIGATOR SUCTION 8MM XI DISP (IRRIGATION / IRRIGATOR)
IV NS 1000ML (IV SOLUTION)
IV NS 1000ML BAXH (IV SOLUTION) IMPLANT
KIT PINK PAD W/HEAD ARE REST (MISCELLANEOUS) ×2
KIT PINK PAD W/HEAD ARM REST (MISCELLANEOUS) ×1 IMPLANT
LABEL OR SOLS (LABEL) ×2 IMPLANT
MANIFOLD NEPTUNE II (INSTRUMENTS) ×2 IMPLANT
NEEDLE HYPO 22GX1.5 SAFETY (NEEDLE) ×2 IMPLANT
NEEDLE INSUFFLATION 14GA 120MM (NEEDLE) ×2 IMPLANT
NS IRRIG 500ML POUR BTL (IV SOLUTION) ×2 IMPLANT
OBTURATOR OPTICAL STANDARD 8MM (TROCAR) ×1
OBTURATOR OPTICAL STND 8 DVNC (TROCAR) ×1
OBTURATOR OPTICALSTD 8 DVNC (TROCAR) ×1 IMPLANT
PACK LAP CHOLECYSTECTOMY (MISCELLANEOUS) ×2 IMPLANT
POUCH SPECIMEN RETRIEVAL 10MM (ENDOMECHANICALS) ×2 IMPLANT
SEAL CANN UNIV 5-8 DVNC XI (MISCELLANEOUS) ×3 IMPLANT
SEAL XI 5MM-8MM UNIVERSAL (MISCELLANEOUS) ×3
SET TUBE SMOKE EVAC HIGH FLOW (TUBING) ×2 IMPLANT
SOLUTION ELECTROLUBE (MISCELLANEOUS) ×2 IMPLANT
SPONGE LAP 4X18 RFD (DISPOSABLE) IMPLANT
STAPLER CANNULA SEAL DVNC XI (STAPLE) ×1 IMPLANT
STAPLER CANNULA SEAL XI (STAPLE) ×1
SUT MNCRL 4-0 (SUTURE) ×1
SUT MNCRL 4-0 27XMFL (SUTURE) ×1
SUT VICRYL 0 AB UR-6 (SUTURE) ×2 IMPLANT
SUTURE MNCRL 4-0 27XMF (SUTURE) ×1 IMPLANT

## 2020-06-07 NOTE — Progress Notes (Signed)
PROGRESS NOTE    Samantha Irwin  YPP:[id number] DOB: 1960/08/10 DOA: 06/03/2020 PCP: Duanne Limerick, MD    Chief Complaint  Patient presents with  . Abdominal Pain    Brief Narrative:  Patient 60 year old female history of hyperlipidemia, hypertension, daily alcohol use, tobacco use, postmenopausal presented to the ED with concerns of abdominal pain.  Patient noted to have a transaminitis.  CT abdomen and pelvis was done.  MRCP done.  GI consulted for further evaluation and management. Patient underwent EGD/ERCP which was consistent with choledocholithiasis, angiodysplasia status post APC, gastritis, peptic ulcer disease.  Patient placed on IV PPI.  General surgery consulted and patient for laparoscopic cholecystectomy 06/07/2020.   Assessment & Plan:   Principal Problem:   Elevated LFTs Active Problems:   Post-ERCP acute pancreatitis   Essential hypertension   Chronic obstructive pulmonary disease (HCC)   Familial hypercholesterolemia   Cigarette nicotine dependence without complication   Alcohol abuse   Abdominal pain   Hypokalemia   Iron deficiency anemia due to chronic blood loss   Gastritis without bleeding   Elevated liver enzymes   Abnormal magnetic resonance imaging of liver   Gastric ulcer: Per ERCP 06/05/2020   Angiodysplasia of stomach and duodenum: Per EGD 06/05/2020  1 abdominal pain/transaminitis likely secondary to choledocholithiasis -Patient presented with abdominal pain noted to have a transaminitis with a AST to ALT ratio of 2:1. -Patient with history of daily alcohol use, and on statin. -Improving clinically. -CT abdomen and pelvis with no evidence of bowel obstruction or bowel pathology, diverticulosis without evidence of diverticulitis, somewhat prominent common bile duct measuring up to 9 mm in diameter but no obstructing lesion in the distal duct or ampullary region noted. -MRCP done with mild diffuse biliary ductal dilatation with CBD measuring  11 mm, no radiographic evidence of choledocholithiasis or other obstructing etiology. -LFTs trended down quickly. -Concern for possible passed stone. -Acute hepatitis panel negative.  -Patient seen in consultation by GI status post EGD(06/05/2020) with gastritis, angiodysplasia status post APC x3.   -ERCP done (06/05/2020) with biliary sphincterotomy and extraction of biliary sludge.  -General surgery consulted and patient for probable laparoscopic cholecystectomy today.  . -General surgery and GI following and appreciate input and recommendations.  2.  Post ERCP pancreatitis -Patient post ERCP with nausea, vomiting, epigastric and right upper quadrant abdominal pain with inability to keep anything down. -Lipase levels trending down currently at 132 from 1035. -Patient with clinical improvement.   -Continue n.p.o. status as patient for cholecystectomy this morning.   -Decrease IV fluids to normal saline at 125 cc an hour  -Pain management, bowel rest, supportive care.  3.  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 -Likely secondary to chronic alcohol use and likely etiology of patient's anemia. -Alcohol cessation stressed to patient. -Continue IV PPI twice daily and when tolerating oral intake will transition to oral PPI twice daily.   -GI following.    4.  Alcohol abuse -Patient states drinks about 3 to 4 glasses of wine per day. -No signs of withdrawal.  -Alcohol cessation stressed to patient. -Continue thiamine, folic acid, multivitamin.   -Ativan withdrawal protocol.   5.  Normocytic anemia/ -Patient with no overt bleeding. -Anemia panel consistent with anemia of chronic disease. -Vitamin B12 level at 469. -We will likely need vitamin B12 supplementation on discharge.   -Patient underwent upper endoscopy noted to have gastritis, nonbleeding ulcer, angiodysplasia status post APC.   -Continue IV PPI every  12 hours.    -Transfusion threshold hemoglobin <  7.  -Follow H&H.    6.  Hypokalemia/hypomagnesemia -Potassium at 3.1, magnesium at 1.6.   -K. Dur 40 mEq p.o. every 4 hours x2 doses.  Magnesium sulfate 4 g IV x1.   -Repeat labs in the morning.   7.  Hyperlipidemia -Continue to hold statin likely resume post discharge in the outpatient setting.    8.  Hypertension Patient with post ERCP pancreatitis.  HCTZ and metoprolol discontinued.  - -Continue IV Lopressor.      DVT prophylaxis: Lovenox Code Status: Full Family Communication: Updated patient and daughter at bedside. Disposition:   Status is: Inpatient    Dispo: The patient is from: Home              Anticipated d/c is to: Home              Patient currently with post ERCP pancreatitis, currently n.p.o., on IV fluids, choledocholithiasis, for cholecystectomy today.  Not stable for discharge.    Difficult to place patient no       Consultants:   Gastroenterology: Dr. Allegra Lai 06/03/2020  General surgery: Dr. Hazle Quant 06/06/2020  Procedures:   CT abdomen and pelvis 06/03/2020  MRCP 06/03/2020  EGD/ERCP 06/05/2020 per Dr. Servando Snare  Antimicrobials:   None   Subjective: Sitting up in bed.  Feeling better.  Denies any further nausea or vomiting.  States significant improvement with abdominal pain.  No bleeding.  No chest pain.  No shortness of breath.  Awaiting surgery today.    Objective: Vitals:   06/06/20 2037 06/07/20 0012 06/07/20 0456 06/07/20 0728  BP: (!) 158/84 (!) 147/70 132/68 (!) 158/85  Pulse: 81 72 68 75  Resp: 17 15 19 16   Temp: 98.1 F (36.7 C) 98.2 F (36.8 C) 97.8 F (36.6 C) 98.1 F (36.7 C)  TempSrc:  Oral    SpO2: 99% 98% 97% 97%  Weight:      Height:        Intake/Output Summary (Last 24 hours) at 06/07/2020 0948 Last data filed at 06/07/2020 0500 Gross per 24 hour  Intake 3618.75 ml  Output --  Net 3618.75 ml   Filed Weights   06/03/20 1257  Weight: 60.8 kg     Examination:  General exam: : NAD Respiratory system: Lungs clear to auscultation bilaterally.  No wheezes, no crackles, no rhonchi.  Normal respiratory effort.  Speaking in full sentences.  Cardiovascular system: Regular rate and rhythm no murmurs rubs or gallops.  No JVD.  No lower extremity edema.  Gastrointestinal system: Abdomen soft, nontender, nondistended, positive bowel sounds.  No rebound.  No guarding. Central nervous system: Alert and oriented. No focal neurological deficits. Extremities: Symmetric 5 x 5 power. Skin: No rashes, lesions or ulcers Psychiatry: Judgement and insight appear normal. Mood & affect appropriate.    Data Reviewed: I have personally reviewed following labs and imaging studies  CBC: Recent Labs  Lab 06/03/20 1318 06/04/20 0403 06/05/20 0455 06/06/20 0448 06/07/20 0429  WBC 5.4 3.3* 3.6* 9.5 4.7  NEUTROABS  --   --  1.8  --  3.0  HGB 10.6* 10.1* 9.8* 10.9* 9.4*  HCT 32.7* 30.3* 30.5* 33.0* 28.9*  MCV 91.6 91.0 92.1 91.7 92.6  PLT 253 221 203 229 170    Basic Metabolic Panel: Recent Labs  Lab 06/03/20 2305 06/04/20 0403 06/05/20 0455 06/06/20 0448 06/07/20 0429  NA 135 139 145 140 138  K 3.2* 3.4* 4.1 3.7 3.1*  CL 100 104 109 107 111  CO2 25 28 27  21* 19*  GLUCOSE 143* 88 110* 109* 62*  BUN 25* 23 13 14 16   CREATININE 1.22* 0.94 0.87 0.86 0.81  CALCIUM 8.8* 8.9 9.2 8.8* 7.8*  MG 1.7 1.9 2.0  --  1.6*  PHOS 4.4  --   --   --   --     GFR: Estimated Creatinine Clearance: 61 mL/min (by C-G formula based on SCr of 0.81 mg/dL).  Liver Function Tests: Recent Labs  Lab 06/03/20 2305 06/04/20 0403 06/05/20 0455 06/06/20 0448 06/07/20 0429  AST 288* 144* 32 21 22  ALT 167* 142* 79* 61* 45*  ALKPHOS 158* 155* 123 128* 114  BILITOT 1.3* 0.9 0.7 1.3* 1.2  PROT 6.6 6.5 6.5 6.9 6.1*  ALBUMIN 3.5 3.5 3.6 3.7 3.1*    CBG: No results for input(s): GLUCAP in the last 168 hours.   Recent Results (from the past 240 hour(s))   SARS CORONAVIRUS 2 (TAT 6-24 HRS) Nasopharyngeal Nasopharyngeal Swab     Status: None   Collection Time: 06/03/20  4:08 PM   Specimen: Nasopharyngeal Swab  Result Value Ref Range Status   SARS Coronavirus 2 NEGATIVE NEGATIVE Final    Comment: (NOTE) SARS-CoV-2 target nucleic acids are NOT DETECTED.  The SARS-CoV-2 RNA is generally detectable in upper and lower respiratory specimens during the acute phase of infection. Negative results do not preclude SARS-CoV-2 infection, do not rule out co-infections with other pathogens, and should not be used as the sole basis for treatment or other patient management decisions. Negative results must be combined with clinical observations, patient history, and epidemiological information. The expected result is Negative.  Fact Sheet for Patients: HairSlick.nohttps://www.fda.gov/media/138098/download  Fact Sheet for Healthcare Providers: quierodirigir.comhttps://www.fda.gov/media/138095/download  This test is not yet approved or cleared by the [place] FDA and  has been authorized for detection and/or diagnosis of SARS-CoV-2 by FDA under an Emergency Use Authorization (EUA). This EUA will remain  in effect (meaning this test can be used) for the duration of the COVID-19 declaration under Se ction 564(b)(1) of the Act, 21 U.S.C. section 360bbb-3(b)(1), unless the authorization is terminated or revoked sooner.  Performed at [facility], [address] [place], [place] [address]          Radiology Studies: DG C-Arm 1-60 Min-No Report  Result Date: 06/05/2020 Fluoroscopy was utilized by the requesting physician.  No radiographic interpretation.        Scheduled Meds: . ceFAZolin  1 g Other To OR  . cholecalciferol  1,000 Units Oral Daily  . enoxaparin (LOVENOX) injection  40 mg Subcutaneous Q2200  . fluticasone furoate-vilanterol  1 puff Inhalation Daily  . indocyanine green  1.25 mg Intravenous Once  . metoprolol tartrate  5 mg Intravenous  Q8H  . oxybutynin  5 mg Oral QHS  . pantoprazole (PROTONIX) IV  40 mg Intravenous Q12H  . potassium chloride  40 mEq Oral Q4H  . thiamine  100 mg Oral Daily   Or  . thiamine  100 mg Intravenous Daily   Continuous Infusions: . sodium chloride 150 mL/hr at 06/06/20 1640  . folic acid (FOLVITE) IVPB 1 mg (06/06/20 1141)  . magnesium sulfate bolus IVPB       LOS: 2 days    Time spent: 40 minutes    Ramiro Harvestaniel Jaylan Hinojosa, MD [facility]   To contact the attending provider between 7A-7P or the covering provider during after hours 7P-7A, please log  into the web site [email or web address] and access using universal [place] password for that web site. If you do not have the password, please call the hospital operator.  06/07/2020, 9:48 AM

## 2020-06-07 NOTE — Anesthesia Postprocedure Evaluation (Signed)
Anesthesia Post Note  Patient: Samantha Irwin  Procedure(s) Performed: XI ROBOTIC ASSISTED LAPAROSCOPIC CHOLECYSTECTOMY (N/A Abdomen)  Patient location during evaluation: PACU Anesthesia Type: General Level of consciousness: awake and alert Pain management: pain level controlled Vital Signs Assessment: post-procedure vital signs reviewed and stable Respiratory status: spontaneous breathing and respiratory function stable Cardiovascular status: stable Anesthetic complications: no   No complications documented.   Last Vitals:  Vitals:   06/07/20 1340 06/07/20 1345  BP:  (!) 143/76  Pulse:  (!) 103  Resp: 16 (!) 26  Temp: 36.7 C   SpO2:  100%    Last Pain:  Vitals:   06/07/20 1345  TempSrc:   PainSc: 0-No pain                 Naiyana Barbian K

## 2020-06-07 NOTE — Anesthesia Preprocedure Evaluation (Signed)
Anesthesia Evaluation  Patient identified by MRN, date of birth, ID band Patient awake    Reviewed: Allergy & Precautions, NPO status , Patient's Chart, lab work & pertinent test results  History of Anesthesia Complications Negative for: history of anesthetic complications  Airway Mallampati: II       Dental  (+) Upper Dentures, Partial Lower   Pulmonary neg sleep apnea, COPD,  COPD inhaler, Current Smoker and Patient abstained from smoking.,           Cardiovascular hypertension, Pt. on medications (-) Past MI and (-) CHF (-) dysrhythmias (-) Valvular Problems/Murmurs     Neuro/Psych neg Seizures    GI/Hepatic Neg liver ROS, PUD, (+)     substance abuse  alcohol use,   Endo/Other  neg diabetes  Renal/GU negative Renal ROS     Musculoskeletal   Abdominal   Peds  Hematology  (+) anemia ,   Anesthesia Other Findings   Reproductive/Obstetrics                             Anesthesia Physical Anesthesia Plan  ASA: III  Anesthesia Plan: General   Post-op Pain Management:    Induction: Intravenous  PONV Risk Score and Plan: 2 and Ondansetron and Dexamethasone  Airway Management Planned: Oral ETT  Additional Equipment:   Intra-op Plan:   Post-operative Plan:   Informed Consent: I have reviewed the patients History and Physical, chart, labs and discussed the procedure including the risks, benefits and alternatives for the proposed anesthesia with the patient or authorized representative who has indicated his/her understanding and acceptance.       Plan Discussed with:   Anesthesia Plan Comments:         Anesthesia Quick Evaluation

## 2020-06-07 NOTE — Op Note (Signed)
Preoperative diagnosis: Choledocholithiasis with cholecystitis  Postoperative diagnosis: Choledocholithiasis with cholecystitis  Procedure: Robotic Assisted Laparoscopic Cholecystectomy.   Anesthesia: GETA   Surgeon: Dr. Hazle Quant  Wound Classification: Clean Contaminated  Indications: Patient is a 60 y.o. female developed right upper quadrant pain and on workup was found to have dilated common bile duct.  MRCP unable to appreciate cholelithiasis or choledocholithiasis but due to the dilated common bile duct of 11 millimeter, ERCP was indicated.  ERCP was performed and showed choledocholithiasis with a large.  Robotic Assisted Laparoscopic cholecystectomy was elected.  Findings: Pericholecystic edema and ascites   Critical view of safety achieved   Cystic duct and artery identified, ligated and divided Adequate hemostasis  Description of procedure: The patient was placed on the operating table in the supine position. General anesthesia was induced. A time-out was completed verifying correct patient, procedure, site, positioning, and implant(s) and/or special equipment prior to beginning this procedure. An orogastric tube was placed. The abdomen was prepped and draped in the usual sterile fashion.  An incision was made in a natural skin line below the umbilicus.  The fascia was elevated and the Veress needle inserted. Proper position was confirmed by aspiration and saline meniscus test.  The abdomen was insufflated with carbon dioxide to a pressure of 15 mmHg. The patient tolerated insufflation well. A 8-mm trocar was then inserted in optiview fashion.  The laparoscope was inserted and the abdomen inspected. No injuries from initial trocar placement were noted. Additional trocars were then inserted in the following locations: an 8-mm trocar in the left lateral abdomen, and another two 8-mm trocars to the right side of the abdomen 5 cm appart. The umbilical trocar was changed to a 12 mm  trocar all under direct visualization. The abdomen was inspected and no abnormalities were found. The table was placed in the reverse Trendelenburg position with the right side up. The robotic arms were docked and target anatomy identified. Instrument inserted under direct visualization.  Filmy adhesions between the gallbladder and omentum, duodenum and transverse colon were lysed with electrocautery. The dome of the gallbladder was grasped with a prograsp and retracted over the dome of the liver. The infundibulum was also grasped with an atraumatic grasper and retracted toward the right lower quadrant. This maneuver exposed Calot's triangle. The peritoneum overlying the gallbladder infundibulum was then incised and the cystic duct and cystic artery identified and circumferentially dissected. Critical view of safety reviewed before ligating any structure. Firefly images taken to visualize biliary ducts. The cystic duct and cystic artery were then doubly clipped and divided close to the gallbladder.  The gallbladder was then dissected from its peritoneal attachments by electrocautery. Hemostasis was checked and the gallbladder and contained stones were removed using an endoscopic retrieval bag. The gallbladder was passed off the table as a specimen.  There was no evidence of bleeding from the gallbladder fossa or cystic artery or leakage of the bile from the cystic duct stump. Secondary trocars were removed under direct vision. No bleeding was noted. The robotic arms were undoked. The scope was withdrawn and the umbilical trocar removed. The abdomen was allowed to collapse. The fascia of the 38mm trocar sites was closed with figure-of-eight 0 vicryl sutures. The skin was closed with subcuticular sutures of 4-0 monocryl and topical skin adhesive. The orogastric tube was removed.  The patient tolerated the procedure well and was taken to the postanesthesia care unit in stable condition.   Specimen:  Gallbladder  Complications: None  EBL: 5  mL

## 2020-06-07 NOTE — Transfer of Care (Signed)
Immediate Anesthesia Transfer of Care Note  Patient: Samantha Irwin  Procedure(s) Performed: XI ROBOTIC ASSISTED LAPAROSCOPIC CHOLECYSTECTOMY (N/A Abdomen)  Patient Location: PACU  Anesthesia Type:General  Level of Consciousness: drowsy, patient cooperative and responds to stimulation  Airway & Oxygen Therapy: Patient Spontanous Breathing and Patient connected to nasal cannula oxygen  Post-op Assessment: Report given to RN and Post -op Vital signs reviewed and stable  Post vital signs: Reviewed and stable  Last Vitals:  Vitals Value Taken Time  BP 143/76 06/07/20 1345  Temp 36.7 C 06/07/20 1340  Pulse 102 06/07/20 1348  Resp 16 06/07/20 1348  SpO2 100 % 06/07/20 1348  Vitals shown include unvalidated device data.  Last Pain:  Vitals:   06/07/20 1340  TempSrc: Temporal  PainSc:          Complications: No complications documented.

## 2020-06-07 NOTE — Anesthesia Procedure Notes (Signed)
Procedure Name: Intubation Date/Time: 06/07/2020 12:26 PM Performed by: Jeronimo Norma, CRNA Pre-anesthesia Checklist: Patient identified, Emergency Drugs available, Suction available, Patient being monitored and Timeout performed Patient Re-evaluated:Patient Re-evaluated prior to induction Oxygen Delivery Method: Circle system utilized Preoxygenation: Pre-oxygenation with 100% oxygen Induction Type: IV induction Ventilation: Oral airway inserted - appropriate to patient size and Mask ventilation without difficulty Laryngoscope Size: McGraph and 3 Grade View: Grade I Tube type: Oral Tube size: 7.0 mm Number of attempts: 1 Airway Equipment and Method: Stylet Placement Confirmation: ETT inserted through vocal cords under direct vision,  positive ETCO2 and breath sounds checked- equal and bilateral Secured at: 21 cm Tube secured with: Tape Dental Injury: Teeth and Oropharynx as per pre-operative assessment

## 2020-06-07 NOTE — Plan of Care (Signed)
Shift Summary: Pt orientedx4, independent in room, VSS. No c/o pain overnight, appears much more comfortable than previous nights. No nausea or vomiting. NS infusing per MAR. Adequate UO, no BM this shift. Tolerated sips of water and ice chips. NPO at midnight per Surgery for potential cholecystectomy today. Safety precautions in place, rounding performed, needs/concerns addressed during shift.   Problem: Education: Goal: Knowledge of General Education information will improve Description: Including pain rating scale, medication(s)/side effects and non-pharmacologic comfort measures Outcome: Progressing   Problem: Health Behavior/Discharge Planning: Goal: Ability to manage health-related needs will improve Outcome: Progressing   Problem: Clinical Measurements: Goal: Ability to maintain clinical measurements within normal limits will improve Outcome: Progressing Goal: Will remain free from infection Outcome: Progressing Goal: Diagnostic test results will improve Outcome: Progressing Goal: Respiratory complications will improve Outcome: Progressing Goal: Cardiovascular complication will be avoided Outcome: Progressing   Problem: Activity: Goal: Risk for activity intolerance will decrease Outcome: Progressing   Problem: Nutrition: Goal: Adequate nutrition will be maintained Outcome: Progressing   Problem: Coping: Goal: Level of anxiety will decrease Outcome: Progressing   Problem: Elimination: Goal: Will not experience complications related to bowel motility Outcome: Progressing Goal: Will not experience complications related to urinary retention Outcome: Progressing   Problem: Pain Managment: Goal: General experience of comfort will improve Outcome: Progressing   Problem: Safety: Goal: Ability to remain free from injury will improve Outcome: Progressing   Problem: Skin Integrity: Goal: Risk for impaired skin integrity will decrease Outcome: Progressing

## 2020-06-07 NOTE — Progress Notes (Signed)
Samantha Minium, MD [facility]   [address] [place], [place] [address] Phone: [phone] Fax : [phone]   Subjective: Patient status post ERCP with some ERCP pancreatitis.  The patient states that she is doing well now without any abdominal pain.  The patient is planned to have a cholecystectomy today.  The patient's liver enzymes have come down as well as her lipase which over 100 today when was over thousand yesterday.   Objective: Vital signs in last 24 hours: Vitals:   06/06/20 2037 06/07/20 0012 06/07/20 0456 06/07/20 0728  BP: (!) 158/84 (!) 147/70 132/68 (!) 158/85  Pulse: 81 72 68 75  Resp: 17 15 19 16   Temp: 98.1 F (36.7 C) 98.2 F (36.8 C) 97.8 F (36.6 C) 98.1 F (36.7 C)  TempSrc:  Oral    SpO2: 99% 98% 97% 97%  Weight:      Height:       Weight change:   Intake/Output Summary (Last 24 hours) at 06/07/2020 0810 Last data filed at 06/07/2020 0500 Gross per 24 hour  Intake 3618.75 ml  Output --  Net 3618.75 ml     Exam: General: Patient sitting in bed without any issues alert and orientated without abdominal pain   Lab Results: @LABTEST2 @ Micro Results: Recent Results (from the past 240 hour(s))  SARS CORONAVIRUS 2 (TAT 6-24 HRS) Nasopharyngeal Nasopharyngeal Swab     Status: None   Collection Time: 06/03/20  4:08 PM   Specimen: Nasopharyngeal Swab  Result Value Ref Range Status   SARS Coronavirus 2 NEGATIVE NEGATIVE Final    Comment: (NOTE) SARS-CoV-2 target nucleic acids are NOT DETECTED.  The SARS-CoV-2 RNA is generally detectable in upper and lower respiratory specimens during the acute phase of infection. Negative results do not preclude SARS-CoV-2 infection, do not rule out co-infections with other pathogens, and should not be used as the sole basis for treatment or other patient management decisions. Negative results must be combined with clinical observations, patient history, and epidemiological information. The expected result  is Negative.  Fact Sheet for Patients:  Fact Sheet for [place] Providers: 06/05/20  This test is not yet approved or cleared by the [employer] FDA and  has been authorized for detection and/or diagnosis of SARS-CoV-2 by FDA under an Emergency Use Authorization (EUA). This EUA will remain  in effect (meaning this test can be used) for the duration of the COVID-19 declaration under Se ction 564(b)(1) of the Act, 21 U.S.C. section 360bbb-3(b)(1), unless the authorization is terminated or revoked sooner.  Performed at [facility], [address], [place], [address] [place]    Studies/Results: DG C-Arm 1-60 Min-No Report  Result Date: 06/05/2020 Fluoroscopy was utilized by the requesting physician.  No radiographic interpretation.   Medications: I have reviewed the patient's current medications. Scheduled Meds: . ceFAZolin  1 g Other To OR  . cholecalciferol  1,000 Units Oral Daily  . enoxaparin (LOVENOX) injection  40 mg Subcutaneous Q2200  . fluticasone furoate-vilanterol  1 puff Inhalation Daily  . indocyanine green  1.25 mg Intravenous Once  . metoprolol tartrate  5 mg Intravenous Q8H  . oxybutynin  5 mg Oral QHS  . pantoprazole (PROTONIX) IV  40 mg Intravenous Q12H  . thiamine  100 mg Oral Daily   Or  . thiamine  100 mg Intravenous Daily   Continuous Infusions: . sodium chloride 150 mL/hr at 06/06/20 1640  . folic acid (FOLVITE) IVPB 1 mg (06/06/20 1141)  PRN Meds:.albuterol, melatonin, morphine injection, ondansetron **OR** ondansetron (ZOFRAN) IV, prochlorperazine   Assessment: Principal Problem:   Elevated LFTs Active Problems:   Essential hypertension   Chronic obstructive pulmonary disease (HCC)   Familial hypercholesterolemia   Cigarette nicotine dependence without complication   Alcohol abuse   Abdominal pain   Hypokalemia   Iron deficiency anemia due to chronic  blood loss   Gastritis without bleeding   Elevated liver enzymes   Abnormal magnetic resonance imaging of liver   Gastric ulcer: Per ERCP 06/05/2020   Angiodysplasia of stomach and duodenum: Per EGD 06/05/2020   Post-ERCP acute pancreatitis    Plan: The patient is going for a cholecystectomy today.  The post ERCP pancreatitis has resolved.  The patient's liver enzymes have come down.  Nothing further to do from a GI point of view.  I will sign off.  Please call if any further GI concerns or questions.  We would like to thank you for the opportunity to participate in the care of Samantha Irwin.     LOS: 2 days   Sherlyn Hay 06/07/2020, 8:10 AM Pager [phone] 7am-5pm  Check AMION for 5pm -7am coverage and on weekends

## 2020-06-08 ENCOUNTER — Other Ambulatory Visit: Payer: Self-pay | Admitting: Family Medicine

## 2020-06-08 DIAGNOSIS — J449 Chronic obstructive pulmonary disease, unspecified: Secondary | ICD-10-CM

## 2020-06-08 DIAGNOSIS — K8050 Calculus of bile duct without cholangitis or cholecystitis without obstruction: Secondary | ICD-10-CM | POA: Diagnosis present

## 2020-06-08 DIAGNOSIS — E7801 Familial hypercholesterolemia: Secondary | ICD-10-CM

## 2020-06-08 DIAGNOSIS — I10 Essential (primary) hypertension: Secondary | ICD-10-CM

## 2020-06-08 HISTORY — DX: Calculus of bile duct without cholangitis or cholecystitis without obstruction: K80.50

## 2020-06-08 LAB — CBC WITH DIFFERENTIAL/PLATELET
Abs Immature Granulocytes: 0.03 10*3/uL (ref 0.00–0.07)
Basophils Absolute: 0 10*3/uL (ref 0.0–0.1)
Basophils Relative: 0 %
Eosinophils Absolute: 0 10*3/uL (ref 0.0–0.5)
Eosinophils Relative: 0 %
HCT: 26.6 % — ABNORMAL LOW (ref 36.0–46.0)
Hemoglobin: 8.8 g/dL — ABNORMAL LOW (ref 12.0–15.0)
Immature Granulocytes: 0 %
Lymphocytes Relative: 9 %
Lymphs Abs: 0.6 10*3/uL — ABNORMAL LOW (ref 0.7–4.0)
MCH: 30.1 pg (ref 26.0–34.0)
MCHC: 33.1 g/dL (ref 30.0–36.0)
MCV: 91.1 fL (ref 80.0–100.0)
Monocytes Absolute: 0.7 10*3/uL (ref 0.1–1.0)
Monocytes Relative: 9 %
Neutro Abs: 6 10*3/uL (ref 1.7–7.7)
Neutrophils Relative %: 82 %
Platelets: 188 10*3/uL (ref 150–400)
RBC: 2.92 MIL/uL — ABNORMAL LOW (ref 3.87–5.11)
RDW: 11.4 % — ABNORMAL LOW (ref 11.5–15.5)
WBC: 7.3 10*3/uL (ref 4.0–10.5)
nRBC: 0.3 % — ABNORMAL HIGH (ref 0.0–0.2)

## 2020-06-08 LAB — COMPREHENSIVE METABOLIC PANEL
ALT: 42 U/L (ref 0–44)
AST: 25 U/L (ref 15–41)
Albumin: 3.1 g/dL — ABNORMAL LOW (ref 3.5–5.0)
Alkaline Phosphatase: 102 U/L (ref 38–126)
Anion gap: 9 (ref 5–15)
BUN: 9 mg/dL (ref 8–23)
CO2: 19 mmol/L — ABNORMAL LOW (ref 22–32)
Calcium: 8 mg/dL — ABNORMAL LOW (ref 8.9–10.3)
Chloride: 110 mmol/L (ref 98–111)
Creatinine, Ser: 0.62 mg/dL (ref 0.44–1.00)
GFR, Estimated: >60 mL/min (ref >60)
Glucose, Bld: 114 mg/dL — ABNORMAL HIGH (ref 70–99)
Potassium: 4.4 mmol/L (ref 3.5–5.1)
Sodium: 138 mmol/L (ref 135–145)
Total Bilirubin: 0.9 mg/dL (ref 0.3–1.2)
Total Protein: 6.3 g/dL — ABNORMAL LOW (ref 6.5–8.1)

## 2020-06-08 LAB — LIPASE, BLOOD: Lipase: 32 U/L (ref 11–51)

## 2020-06-08 LAB — MAGNESIUM: Magnesium: 2.4 mg/dL (ref 1.7–2.4)

## 2020-06-08 MED ORDER — ATORVASTATIN CALCIUM 10 MG PO TABS: 10.0000 mg | ORAL_TABLET | Freq: Every day | ORAL | 1 refills | Status: DC

## 2020-06-08 MED ORDER — HYDROCODONE-ACETAMINOPHEN 5-325 MG PO TABS: 1.0000 | ORAL_TABLET | ORAL | 0 refills | Status: AC | PRN

## 2020-06-08 MED ORDER — VITAMIN B-12 1000 MCG PO TABS: 1000.0000 ug | ORAL_TABLET | Freq: Every day | ORAL | Status: AC

## 2020-06-08 MED ORDER — THIAMINE HCL 100 MG PO TABS: 100.0000 mg | ORAL_TABLET | Freq: Every day | ORAL | Status: AC

## 2020-06-08 MED ORDER — PANTOPRAZOLE SODIUM 40 MG PO TBEC: 40.0000 mg | DELAYED_RELEASE_TABLET | Freq: Two times a day (BID) | ORAL | 3 refills | Status: DC

## 2020-06-08 MED ORDER — SODIUM BICARBONATE 650 MG PO TABS
650.0000 mg | ORAL_TABLET | Freq: Two times a day (BID) | ORAL | Status: DC
  Administered 2020-06-08: 10:00:00 650 mg via ORAL
  Filled 2020-06-08 (×2): qty 1

## 2020-06-08 NOTE — Progress Notes (Signed)
Pt d/c to home via daughter. IV removed intact. VSS. Education complete. All questions answered. All belongings sent with pt. Work note given to pt.

## 2020-06-08 NOTE — Telephone Encounter (Signed)
Requested Prescriptions  Pending Prescriptions Disp Refills  . atorvastatin (LIPITOR) 10 MG tablet [Pharmacy Med Name: Atorvastatin Calcium 10 MG Oral Tablet] 30 tablet     Sig: Take 1 tablet by mouth once daily     Cardiovascular:  Antilipid - Statins Failed - 06/08/2020  2:43 PM      Failed - Total Cholesterol in normal range and within 360 days    Cholesterol, Total  Date Value Ref Range Status  03/19/2020 265 (H) 100 - 199 mg/dL Final         Failed - LDL in normal range and within 360 days    LDL Chol Calc (NIH)  Date Value Ref Range Status  03/19/2020 170 (H) 0 - 99 mg/dL Final         Passed - HDL in normal range and within 360 days    HDL  Date Value Ref Range Status  03/19/2020 78 >39 mg/dL Final         Passed - Triglycerides in normal range and within 360 days    Triglycerides  Date Value Ref Range Status  03/19/2020 101 0 - 149 mg/dL Final         Passed - Patient is not pregnant      Passed - Valid encounter within last 12 months    Recent Outpatient Visits          2 months ago Essential hypertension   [facility] Duanne Limerick, MD   2 months ago Annual physical exam   [facility] Duanne Limerick, MD   4 months ago Chronic cough   [facility] Duanne Limerick, MD   4 months ago Acute non-recurrent maxillary sinusitis   [facility] Duanne Limerick, MD   5 months ago Acute non-recurrent maxillary sinusitis   [facility] Duanne Limerick, MD      Future Appointments            In 3 months Duanne Limerick, MD [facility]           . metoprolol succinate (TOPROL-XL) 25 MG 24 hr tablet [Pharmacy Med Name: Metoprolol Succinate ER 25 MG Oral Tablet Extended Release 24 Hour] 90 tablet     Sig: Take 1 tablet by mouth once daily     Cardiovascular:  Beta Blockers Passed - 06/08/2020  2:43 PM      Passed - Last BP in normal range    BP Readings from Last 1 Encounters:  06/08/20 122/69          Passed - Last Heart Rate in normal range    Pulse Readings from Last 1 Encounters:  06/08/20 66         Passed - Valid encounter within last 6 months    Recent Outpatient Visits          2 months ago Essential hypertension   [facility] Duanne Limerick, MD   2 months ago Annual physical exam   [facility] Duanne Limerick, MD   4 months ago Chronic cough   [facility] Duanne Limerick, MD   4 months ago Acute non-recurrent maxillary sinusitis   [facility] Duanne Limerick, MD   5 months ago Acute non-recurrent maxillary sinusitis   [facility] Duanne Limerick, MD      Future Appointments            In 3  months Juline Patch, MD [place]

## 2020-06-08 NOTE — Discharge Instructions (Signed)

## 2020-06-08 NOTE — Discharge Summary (Signed)
Physician Discharge Summary  Samantha Irwin ZCH:[id number] DOB: [birth year] DOA: 06/03/2020  PCP: Duanne Limerick, MD  Admit date: 06/03/2020 Discharge date: 06/08/2020  Time spent: 60 minutes  Recommendations for Outpatient Follow-up:  1. Follow-up with Dr. Hazle Quant in 2 weeks. 2. Follow-up with Dr. Allegra Lai, gastroenterology in 2 to 3 weeks.  On follow-up patient will need a comprehensive metabolic profile done to follow-up on electrolytes and renal function, CBC with differential, follow-up on peptic ulcer disease/gastritis and angiodysplasias in iron deficiency anemia. 3. Follow-up with Duanne Limerick, MD in 2 to 3 weeks.  On follow-up patient will need a comprehensive metabolic profile done to follow-up on electrolytes and renal function.  Patient will need a CBC done to follow-up on iron deficiency anemia.  Patient may benefit from IV iron.   Discharge Diagnoses:  Principal Problem:   Elevated LFTs Active Problems:   Post-ERCP acute pancreatitis   Choledocholithiasis   Essential hypertension   Chronic obstructive pulmonary disease (HCC)   Familial hypercholesterolemia   Cigarette nicotine dependence without complication   Alcohol abuse   Abdominal pain   Hypokalemia   Iron deficiency anemia due to chronic blood loss   Gastritis without bleeding   Elevated liver enzymes   Abnormal magnetic resonance imaging of liver   Gastric ulcer: Per ERCP 06/05/2020   Angiodysplasia of stomach and duodenum: Per EGD 06/05/2020   Discharge Condition: Stable and improved  Diet recommendation: Heart healthy  Filed Weights   06/03/20 1257  Weight: 60.8 kg    History of present illness:  HPI per Dr. Margart Sickles Samantha Irwin is a 60 y.o. female with medical history significant for hyperlipidemia, hypertension, daily alcohol use, postmenasal, tobacco use, presents to the emergency department for chief concerns of abdominal pain.  Patient states that the abdominal pain started  around 11/11:20 AM on day of admission.  She states she has never had this pain before.  She describes the pain as sharp stabbing pain and palpation makes it worse.  She states that the location of the pain is periumbilical and epigastric.    She states the pain is 10/10 and now improved to a 5/10. She states the pain comes and go, she endorses radiation to her back. She states the pain lasts seconds and is intermittent, this lasted about 1 hour.  She states her last BM was normal and it was 06/03/2020 and nausea and vomiting.   She denies regular ibuprofen, NSAID use.  Social history: lives alone, smokes 3 cigarettes per day, started at age 36, she drinks 2-3 glasses of wine per day, a 750 bottle lasts two days per day. No recreational drug use.  Health maintenance - last colonoscopy was 5-6 years, she has had two colonoscopy  Vaccinations: Patient is fully vaccinated for COVID-19.  Hospital Course:  1 abdominal pain/transaminitis secondary to choledocholithiasis -Patient presented with abdominal pain noted to have a transaminitis with a AST to ALT ratio of 2:1. -Patient with history of daily alcohol use, and on statin. -CT abdomen and pelvis with no evidence of bowel obstruction or bowel pathology, diverticulosis without evidence of diverticulitis, somewhat prominent common bile duct measuring up to 9 mm in diameter but no obstructing lesion in the distal duct or ampullary region noted. -MRCP done with mild diffuse biliary ductal dilatation with CBD measuring 11 mm, no radiographic evidence of choledocholithiasis or other obstructing etiology. -LFTs trended down quickly. -Concern for possible passed stone. -Acute hepatitis panel negative.  -Patient seen in consultation by  GI, status post EGD(06/05/2020) with gastritis, angiodysplasia status post APC x3.   -ERCP done (06/05/2020) with biliary sphincterotomy and extraction of biliary sludge.  -General surgery consulted and patient  underwent laparoscopic cholecystectomy on 06/07/2020 without any complications.  -Patient improved clinically was started on a diet and diet advanced to a soft diet which he tolerated.  -Abdominal pain improved significantly by day of discharge -Outpatient follow-up with general surgery and GI.  2.  Post ERCP pancreatitis -Patient post ERCP with nausea, vomiting, epigastric and right upper quadrant abdominal pain with inability to keep anything down. -Lipase levels trended down and had normalized to 32 by day of discharge from as high as 1035.   -Patient initially placed on bowel rest, aggressive fluid resuscitation, pain management, antiemetics.   -Patient improved clinically and post ERCP pancreatitis had resolved by day of discharge.  -Patient tolerating soft diet by day of discharge.  3.  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 -Likely secondary to chronic alcohol use and likely etiology of patient's anemia. -Alcohol cessation stressed to patient. -Patient maintained on IV PPI twice daily will be discharged home on oral PPI twice daily x2 months and then daily.   -Outpatient follow-up with GI, Dr. Allegra Lai in 2 to 3 weeks.   4.  Alcohol abuse -Patient stated drinks about 3 to 4 glasses of wine per day. -No signs of withdrawal.  -Alcohol cessation stressed to patient. -Patient maintained on thiamine, folic acid, multivitamin and Ativan withdrawal protocol during the hospitalization.   -Patient had no signs of withdrawal during the hospitalization and remained in stable condition.  5.  Normocytic anemia/ -Patient with no overt bleeding. -Anemia panel consistent with anemia of chronic disease. -Vitamin B12 level at 469. -Patient was placed on vitamin B12 supplementation during the hospitalization and will be discharged on oral vitamin B12 supplementation . -Patient underwent upper endoscopy noted to have gastritis, nonbleeding  ulcer, angiodysplasia status post APC.   -Patient placed on IV PPI twice daily during the hospitalization will be discharged home on oral PPI twice daily x2 months and then daily.   -Outpatient follow-up with GI.  6.  Hypokalemia/hypomagnesemia -Repleted during the hospitalization.  Outpatient follow-up.  7.  Hyperlipidemia -Statin held and will be resumed 2 to 3 weeks post discharge.    8.  Hypertension Patient with post ERCP pancreatitis.  HCTZ and metoprolol discontinued.  - -Patient maintained on IV Lopressor and home antihypertensive medications will be resumed on discharge..     Procedures:  CT abdomen and pelvis 06/03/2020  MRCP 06/03/2020  EGD/ERCP 06/05/2020 per Dr. Servando Snare  Laparoscopic cholecystectomy 06/07/2020 per general surgery, Dr. Hazle Quant   Consultations:  Gastroenterology: Dr. Allegra Lai 06/03/2020  General surgery: Dr. Hazle Quant 06/06/2020   Discharge Exam: Vitals:   06/08/20 0808 06/08/20 1201  BP: (!) 141/80 122/69  Pulse: 74 66  Resp: 16 16  Temp: 97.9 F (36.6 C) 97.8 F (36.6 C)  SpO2: 99% 100%    General: NAD Cardiovascular: RRR Respiratory: CTAB  Discharge Instructions   Discharge Instructions    Diet - low sodium heart healthy   Complete by: As directed    Discharge instructions   Complete by: As directed    Please stop drinking alcohol   Increase activity slowly   Complete by: As directed      Allergies as of 06/08/2020   No Known Allergies     Medication List    STOP taking these medications   etodolac 200  MG capsule Commonly known as: LODINE     TAKE these medications   albuterol 108 (90 Base) MCG/ACT inhaler Commonly known as: VENTOLIN HFA Inhale 2 puffs into the lungs every 6 (six) hours as needed for wheezing or shortness of breath.   atorvastatin 10 MG tablet Commonly known as: LIPITOR Take 1 tablet (10 mg total) by mouth daily. Start taking on: Jun 22, 2020 What changed: These instructions start on  Jun 22, 2020. If you are unsure what to do until then, ask your doctor or other care provider.   B COMPLEX PO Take by mouth daily.   Biotin 10 MG Tabs Take 1 tablet by mouth daily.   carbamide peroxide 6.5 % OTIC solution Commonly known as: DEBROX Place 5 drops into both ears 2 (two) times daily.   cholecalciferol 1000 units tablet Commonly known as: VITAMIN D Take 1,000 Units by mouth daily.   diclofenac Sodium 1 % Gel Commonly known as: Voltaren Apply 2 g topically 4 (four) times daily.   Fish Oil 1000 MG Caps Take 1 capsule (1,000 mg total) by mouth daily.   fluticasone furoate-vilanterol 100-25 MCG/INH Aepb Commonly known as: BREO ELLIPTA Inhale 1 puff into the lungs in the morning.   hydrochlorothiazide 12.5 MG tablet Commonly known as: HYDRODIURIL Take 1 tablet (12.5 mg total) by mouth daily.   HYDROcodone-acetaminophen 5-325 MG tablet Commonly known as: Norco Take 1 tablet by mouth every 4 (four) hours as needed for up to 3 days for moderate pain.   metoprolol succinate 25 MG 24 hr tablet Commonly known as: TOPROL-XL Take 1 tablet (25 mg total) by mouth daily.   MULTIVITAMIN WOMEN 50+ PO Take 1 tablet by mouth daily.   oxybutynin 5 MG 24 hr tablet Commonly known as: DITROPAN-XL Take 1 tablet (5 mg total) by mouth at bedtime.   pantoprazole 40 MG tablet Commonly known as: Protonix Take 1 tablet (40 mg total) by mouth 2 (two) times daily before a meal.   thiamine 100 MG tablet Take 1 tablet (100 mg total) by mouth daily. Start taking on: Jun 09, 2020   triamcinolone ointment 0.5 % Commonly known as: KENALOG Apply 1 application topically 2 (two) times daily.   vitamin B-12 1000 MCG tablet Commonly known as: CYANOCOBALAMIN Take 1 tablet (1,000 mcg total) by mouth daily.   vitamin C 250 MG tablet Commonly known as: ASCORBIC ACID Take 250 mg by mouth daily.      No Known Allergies  Follow-up Information    Carolan Shiver, MD Follow up in 2  week(s).   Specialty: General Surgery Why: Status post cholecystectomy Contact information: [address] [place] [address] [phone]        Duanne Limerick, MD. Schedule an appointment as soon as possible for a visit in 2 week(s).   Specialty: Family Medicine Why: f/u in 2-3 weeks. Contact information: [address] [place] [address] [phone]        Toney Reil, MD. Schedule an appointment as soon as possible for a visit in 2 week(s).   Specialty: Gastroenterology Why: f/u in 2-3 weeks. Contact information: [address] [place] [address] [phone]                The results of significant diagnostics from this hospitalization (including imaging, microbiology, ancillary and laboratory) are listed below for reference.    Significant Diagnostic Studies: CT ABDOMEN PELVIS W CONTRAST  Result Date: 06/03/2020 CLINICAL DATA:  Epigastric  pain over the last several days. Bowel obstruction suspected. EXAM: CT ABDOMEN AND PELVIS WITH CONTRAST TECHNIQUE: Multidetector CT imaging of the abdomen and pelvis was performed using the standard protocol following bolus administration of intravenous contrast. CONTRAST:  OMNIPAQUE IOHEXOL 300 MG/ML  SOLN COMPARISON:  None. FINDINGS: Lower chest: Normal Hepatobiliary: The liver has a normal appearance without focal lesions or intrahepatic biliary ductal dilatation. Common bile duct is somewhat prominent, measuring up to 9 mm in diameter, but I do not see an obstructing lesion in the distal duct or ampullary region. No calcified gallstones. Pancreas: Normal Spleen: Normal Adrenals/Urinary Tract: Adrenal glands are normal. Kidneys are normal. Bladder is normal. Stomach/Bowel: Stomach and small intestine are normal. The appendix is normal. No colon pathology is evident. Amount of fecal matter within normal limits. Diverticulosis of the left colon without evidence of diverticulitis.  Vascular/Lymphatic: Aortic atherosclerosis. No aneurysm. IVC is normal. No retroperitoneal adenopathy. Reproductive: Normal.  No pelvic mass. Other: No free fluid or air.  No hernia. Musculoskeletal: Normal.  No significant degenerative changes. IMPRESSION: 1. No acute finding to explain the clinical presentation. No evidence of bowel obstruction or other bowel pathology. Diverticulosis of the left colon without evidence of diverticulitis. 2. Aortic atherosclerosis. 3. Common bile duct is somewhat prominent, measuring up to 9 mm in diameter, but I do not see an obstructing lesion in the distal duct or ampullary region. Aortic Atherosclerosis (ICD10-I70.0). Electronically Signed   By: Paulina Fusi M.D.   On: 06/03/2020 14:39   MR ABDOMEN MRCP WO CONTRAST  Result Date: 06/03/2020 CLINICAL DATA:  Epigastric pain for several days. Biliary ductal dilatation on recent CT. EXAM: MRI ABDOMEN WITHOUT CONTRAST  (INCLUDING MRCP) TECHNIQUE: Multiplanar multisequence MR imaging of the abdomen was performed. Heavily T2-weighted images of the biliary and pancreatic ducts were obtained, and three-dimensional MRCP images were rendered by post processing. COMPARISON:  CT on 06/03/2020 and ultrasound on 10/09/2013 FINDINGS: Lower chest: No acute findings. Hepatobiliary: No masses visualized on this unenhanced exam. Gallbladder is unremarkable. Mild diffuse biliary ductal dilatation is seen with common bile duct measuring 11 mm. No evidence of choledocholithiasis or biliary stricture. Pancreas: No mass or inflammatory process visualized on this unenhanced exam. No evidence of pancreatic ductal dilatation or pancreas divisum. Spleen:  Within normal limits in size. Adrenals/Urinary tract: Unremarkable. No evidence of nephrolithiasis or hydronephrosis. Stomach/Bowel: Visualized portion unremarkable. Vascular/Lymphatic: No pathologically enlarged lymph nodes identified. No evidence of abdominal aortic aneurysm. Other:  None.  Musculoskeletal:  No suspicious bone lesions identified. IMPRESSION: Mild diffuse biliary ductal dilatation, with common bile duct measuring 11 mm. No radiographic evidence of choledocholithiasis or other obstructing etiology. Electronically Signed   By: Danae Orleans M.D.   On: 06/03/2020 18:55   MR 3D Recon At Scanner  Result Date: 06/03/2020 CLINICAL DATA:  Epigastric pain for several days. Biliary ductal dilatation on recent CT. EXAM: MRI ABDOMEN WITHOUT CONTRAST  (INCLUDING MRCP) TECHNIQUE: Multiplanar multisequence MR imaging of the abdomen was performed. Heavily T2-weighted images of the biliary and pancreatic ducts were obtained, and three-dimensional MRCP images were rendered by post processing. COMPARISON:  CT on 06/03/2020 and ultrasound on 10/09/2013 FINDINGS: Lower chest: No acute findings. Hepatobiliary: No masses visualized on this unenhanced exam. Gallbladder is unremarkable. Mild diffuse biliary ductal dilatation is seen with common bile duct measuring 11 mm. No evidence of choledocholithiasis or biliary stricture. Pancreas: No mass or inflammatory process visualized on this unenhanced exam. No evidence of pancreatic ductal dilatation or pancreas divisum. Spleen:  Within normal limits in size. Adrenals/Urinary tract: Unremarkable. No evidence of nephrolithiasis or hydronephrosis. Stomach/Bowel: Visualized portion unremarkable. Vascular/Lymphatic: No pathologically enlarged lymph nodes identified. No evidence of abdominal aortic aneurysm. Other:  None. Musculoskeletal:  No suspicious bone lesions identified. IMPRESSION: Mild diffuse biliary ductal dilatation, with common bile duct measuring 11 mm. No radiographic evidence of choledocholithiasis or other obstructing etiology. Electronically Signed   By: Danae OrleansJohn A Stahl M.D.   On: 06/03/2020 18:55   DG C-Arm 1-60 Min-No Report  Result Date: 06/05/2020 Fluoroscopy was utilized by the requesting physician.  No radiographic interpretation.     Microbiology: Recent Results (from the past 240 hour(s))  SARS CORONAVIRUS 2 (TAT 6-24 HRS) Nasopharyngeal Nasopharyngeal Swab     Status: None   Collection Time: 06/03/20  4:08 PM   Specimen: Nasopharyngeal Swab  Result Value Ref Range Status   SARS Coronavirus 2 NEGATIVE NEGATIVE Final    Comment: (NOTE) SARS-CoV-2 target nucleic acids are NOT DETECTED.  The SARS-CoV-2 RNA is generally detectable in upper and lower respiratory specimens during the acute phase of infection. Negative results do not preclude SARS-CoV-2 infection, do not rule out co-infections with other pathogens, and should not be used as the sole basis for treatment or other patient management decisions. Negative results must be combined with clinical observations, patient history, and epidemiological information. The expected result is Negative.  Fact Sheet for Patients: HairSlick.nohttps://www.fda.gov/media/138098/download  Fact Sheet for Healthcare Providers: quierodirigir.comhttps://www.fda.gov/media/138095/download  This test is not yet approved or cleared by the [place] FDA and  has been authorized for detection and/or diagnosis of SARS-CoV-2 by FDA under an Emergency Use Authorization (EUA). This EUA will remain  in effect (meaning this test can be used) for the duration of the COVID-19 declaration under Se ction 564(b)(1) of the Act, 21 U.S.C. section 360bbb-3(b)(1), unless the authorization is terminated or revoked sooner.  Performed at [facility], [address] [place], [place] [address]      Labs: Basic Metabolic Panel: Recent Labs  Lab 06/03/20 2305 06/04/20 0403 06/05/20 0455 06/06/20 0448 06/07/20 0429 06/08/20 0550  NA 135 139 145 140 138 138  K 3.2* 3.4* 4.1 3.7 3.1* 4.4  CL 100 104 109 107 111 110  CO2 25 28 27  21* 19* 19*  GLUCOSE 143* 88 110* 109* 62* 114*  BUN 25* 23 13 14 16 9   CREATININE 1.22* 0.94 0.87 0.86 0.81 0.62  CALCIUM 8.8* 8.9 9.2 8.8* 7.8* 8.0*  MG 1.7 1.9 2.0  --   1.6* 2.4  PHOS 4.4  --   --   --   --   --    Liver Function Tests: Recent Labs  Lab 06/04/20 0403 06/05/20 0455 06/06/20 0448 06/07/20 0429 06/08/20 0550  AST 144* 32 21 22 25   ALT 142* 79* 61* 45* 42  ALKPHOS 155* 123 128* 114 102  BILITOT 0.9 0.7 1.3* 1.2 0.9  PROT 6.5 6.5 6.9 6.1* 6.3*  ALBUMIN 3.5 3.6 3.7 3.1* 3.1*   Recent Labs  Lab 06/03/20 1318 06/06/20 0448 06/07/20 0429 06/08/20 0550  LIPASE 33 1,035* 132* 32   No results for input(s): AMMONIA in the last 168 hours. CBC: Recent Labs  Lab 06/04/20 0403 06/05/20 0455 06/06/20 0448 06/07/20 0429 06/08/20 0550  WBC 3.3* 3.6* 9.5 4.7 7.3  NEUTROABS  --  1.8  --  3.0 6.0  HGB 10.1* 9.8* 10.9* 9.4* 8.8*  HCT 30.3* 30.5* 33.0* 28.9* 26.6*  MCV 91.0 92.1 91.7 92.6 91.1  PLT  221 203 229 170 188   Cardiac Enzymes: No results for input(s): CKTOTAL, CKMB, CKMBINDEX, TROPONINI in the last 168 hours. BNP: BNP (last 3 results) No results for input(s): BNP in the last 8760 hours.  ProBNP (last 3 results) No results for input(s): PROBNP in the last 8760 hours.  CBG: No results for input(s): GLUCAP in the last 168 hours.     Signed:  Ramiro Harvest MD.  [facility] 06/08/2020, 2:05 PM

## 2020-06-08 NOTE — Plan of Care (Signed)

## 2020-06-08 NOTE — Progress Notes (Signed)
Patient ID: Samantha Irwin, female   DOB: 1960-12-05, 60 y.o.   MRN: [id number]     SURGICAL PROGRESS NOTE   Hospital Day(s): 3.   Post op day(s): 1 Day Post-Op.   Interval History: Patient seen and examined, no acute events or new complaints overnight. Patient reports feeling well.  She reports mild soreness on the left side of the abdomen.  She denies any fever or chills.  She has been able to tolerate diet.  She has been ambulating.  Vital signs in last 24 hours: [min-max] current  Temp:  [97.7 F (36.5 C)-98.5 F (36.9 C)] 97.9 F (36.6 C) (05/01 0808) Pulse Rate:  [72-103] 74 (05/01 0808) Resp:  [13-26] 16 (05/01 0808) BP: (125-164)/(70-87) 141/80 (05/01 0808) SpO2:  [95 %-100 %] 99 % (05/01 0808)     Height: 5\' 1"  (154.9 cm) Weight: 60.8 kg BMI (Calculated): 25.33   Physical Exam:  Constitutional: alert, cooperative and no distress  Respiratory: breathing non-labored at rest  Cardiovascular: regular rate and sinus rhythm  Gastrointestinal: soft, non-tender, and non-distended  Labs:  CBC Latest Ref Rng & Units 06/08/2020 06/07/2020 06/06/2020  WBC 4.0 - 10.5 K/uL 7.3 4.7 9.5  Hemoglobin 12.0 - 15.0 g/dL 06/08/2020) 5.2(D) 10.9(L)  Hematocrit 36.0 - 46.0 % 26.6(L) 28.9(L) 33.0(L)  Platelets 150 - 400 K/uL 188 170 229   CMP Latest Ref Rng & Units 06/08/2020 06/07/2020 06/06/2020  Glucose 70 - 99 mg/dL 06/08/2020) 423(N) 36(R)  BUN 8 - 23 mg/dL 9 16 14   Creatinine 0.44 - 1.00 mg/dL 443(X 5.40  Sodium 135 - 145 mmol/L 138 138 140  Potassium 3.5 - 5.1 mmol/L 4.4 3.1(L) 3.7  Chloride 98 - 111 mmol/L 110 111 107  CO2 22 - 32 mmol/L 19(L) 19(L) 21(L)  Calcium 8.9 - 10.3 mg/dL 8.0(L) 7.8(L) 8.8(L)  Total Protein 6.5 - 8.1 g/dL 6.3(L) 6.1(L) 6.9  Total Bilirubin 0.3 - 1.2 mg/dL 0.9 1.2 0.86)  Alkaline Phos 38 - 126 U/L 102 114 128(H)  AST 15 - 41 U/L 25 22 21   ALT 0 - 44 U/L 42 45(H) 61(H)    Imaging studies: No new pertinent imaging studies   Assessment/Plan:  60 y.o. female  with choledocholithiasis 1 Day Post-Op s/p robotic assisted laparoscopic cholecystectomy.  Patient recovering well.  Pain control.  Wounds looking good.  Labs shows improved lipase and bilirubin.  No sign of complication from surgical perspective.  From the surgical standpoint patient can be discharged.  I discussed with the patient instruction regarding diet, wound care.  I sent prescription for postop pain medication at home.  I will follow patient in my office in 2 weeks.  9.5(K, MD

## 2020-06-09 ENCOUNTER — Telehealth: Payer: Self-pay | Admitting: Family Medicine

## 2020-06-09 ENCOUNTER — Other Ambulatory Visit: Payer: Self-pay

## 2020-06-09 ENCOUNTER — Telehealth: Payer: Self-pay

## 2020-06-09 DIAGNOSIS — I10 Essential (primary) hypertension: Secondary | ICD-10-CM

## 2020-06-09 MED ORDER — METOPROLOL SUCCINATE ER 25 MG PO TB24: 25.0000 mg | ORAL_TABLET | Freq: Every day | ORAL | 0 refills | Status: DC

## 2020-06-09 NOTE — Telephone Encounter (Signed)
Pt is calling back to ask who else does she need to follow up with after get out of the hospital? She believes it is the GI doctor but is not sure. Please advise [phone]

## 2020-06-09 NOTE — Telephone Encounter (Signed)
Spoke to pt- gave her info on Dr Maia Plan, Doyle Askew and Mychart to get set back up.

## 2020-06-09 NOTE — Telephone Encounter (Cosign Needed)
Transition Care Management Follow-up Telephone Call  Date of discharge and from where: 06/08/20 [facility]  How have you been since you were released from the hospital? Pt states she is feeling okay, feels weak and sore and appetite is still decreased  Any questions or concerns? No  Items Reviewed:  Did the pt receive and understand the discharge instructions provided? Yes   Medications obtained and verified? Yes   Other? No   Any new allergies since your discharge? No   Dietary orders reviewed? Yes  Do you have support at home? Yes   Home Care and Equipment/Supplies: Were home health services ordered? no Were any new equipment or medical supplies ordered?  No  Functional Questionnaire: (I = Independent and D = Dependent) ADLs: I  Bathing/Dressing- I  Meal Prep- I  Eating- I  Maintaining continence- I  Transferring/Ambulation- I  Managing Meds- I  Follow up appointments reviewed:   PCP Hospital f/u appt confirmed? Yes  Scheduled to see Dr. Yetta Barre on 06/19/20 @ 1:20.  Specialist Hospital f/u appt confirmed? No  pt to call and schedule appt  Are transportation arrangements needed? No   If their condition worsens, is the pt aware to call PCP or go to the Emergency Dept.? Yes  Was the patient provided with contact information for the PCP's office or ED? Yes  Was to pt encouraged to call back with questions or concerns? Yes

## 2020-06-10 LAB — SURGICAL PATHOLOGY

## 2020-06-19 ENCOUNTER — Ambulatory Visit: Payer: BC Managed Care – PPO | Admitting: Family Medicine

## 2020-06-19 ENCOUNTER — Other Ambulatory Visit: Payer: Self-pay

## 2020-06-19 ENCOUNTER — Encounter: Payer: Self-pay | Admitting: Family Medicine

## 2020-06-19 ENCOUNTER — Telehealth: Payer: Self-pay | Admitting: Family Medicine

## 2020-06-19 VITALS — BP 120/80 | HR 64 | Ht 61.0 in | Wt 121.0 lb

## 2020-06-19 DIAGNOSIS — Z09 Encounter for follow-up examination after completed treatment for conditions other than malignant neoplasm: Secondary | ICD-10-CM | POA: Diagnosis not present

## 2020-06-19 DIAGNOSIS — E7801 Familial hypercholesterolemia: Secondary | ICD-10-CM | POA: Diagnosis not present

## 2020-06-19 DIAGNOSIS — E876 Hypokalemia: Secondary | ICD-10-CM

## 2020-06-19 DIAGNOSIS — D500 Iron deficiency anemia secondary to blood loss (chronic): Secondary | ICD-10-CM | POA: Diagnosis not present

## 2020-06-19 DIAGNOSIS — R7989 Other specified abnormal findings of blood chemistry: Secondary | ICD-10-CM

## 2020-06-19 DIAGNOSIS — I10 Essential (primary) hypertension: Secondary | ICD-10-CM | POA: Diagnosis not present

## 2020-06-19 NOTE — Telephone Encounter (Signed)
In pt's chart- noted

## 2020-06-19 NOTE — Patient Instructions (Signed)

## 2020-06-19 NOTE — Telephone Encounter (Signed)
Pt was taken off of Atorvastatin 10mg  and she is back home now and she is suppose to start back on this med May 15th / pt wanted to let May 17 know/ please advise

## 2020-06-19 NOTE — Progress Notes (Signed)
Date:  06/19/2020   Name:  Samantha Irwin   DOB:  03-18-1960   MRN:  [id number]   Chief Complaint: Hospitalization Follow-up (Discharged on 5/1 for gallbladder removal)  Pt was recently admitted to [facility] on 06/03/20 for choledocholithiasis and was discharged on 5/1/ 22. Transition of care call placed on 5/2 22.Marland Kitchen   Hypertension This is a chronic problem. The current episode started more than 1 year ago. The problem has been gradually improving since onset. The problem is controlled. Pertinent negatives include no anxiety, blurred vision, chest pain, headaches, malaise/fatigue, neck pain, orthopnea, palpitations, peripheral edema, PND, shortness of breath or sweats. There are no associated agents to hypertension. Risk factors for coronary artery disease include smoking/tobacco exposure. Past treatments include beta blockers. The current treatment provides moderate improvement. There are no compliance problems.  There is no history of angina, kidney disease, CAD/MI, CVA, heart failure, left ventricular hypertrophy, PVD or retinopathy. There is no history of chronic renal disease, a hypertension causing med or renovascular disease.  Anemia Presents for follow-up visit. There has been no abdominal pain, anorexia, bruising/bleeding easily, confusion, fever, leg swelling, light-headedness, malaise/fatigue, pallor, palpitations, paresthesias, pica or weight loss. Signs of blood loss that are not present include hematemesis, hematochezia and melena. There is no history of chronic renal disease, heart failure or hypothyroidism. There are no compliance problems.   Hyperlipidemia This is a chronic problem. The current episode started more than 1 year ago. The problem is controlled. Recent lipid tests were reviewed and are normal. She has no history of chronic renal disease, diabetes, hypothyroidism, liver disease, obesity or nephrotic syndrome. Factors aggravating her hyperlipidemia include  thiazides. Pertinent negatives include no chest pain, focal sensory loss, focal weakness, leg pain, myalgias or shortness of breath. Current antihyperlipidemic treatment includes diet change.    Lab Results  Component Value Date   CREATININE 0.62 06/08/2020   BUN 9 06/08/2020   NA 138 06/08/2020   K 4.4 06/08/2020   CL 110 06/08/2020   CO2 19 (L) 06/08/2020   Lab Results  Component Value Date   CHOL 265 (H) 03/19/2020   HDL 78 03/19/2020   LDLCALC 170 (H) 03/19/2020   TRIG 101 03/19/2020   CHOLHDL 3.2 05/30/2018   No results found for: TSH Lab Results  Component Value Date   HGBA1C 5.2 09/13/2016   Lab Results  Component Value Date   WBC 7.3 06/08/2020   HGB 8.8 (L) 06/08/2020   HCT 26.6 (L) 06/08/2020   MCV 91.1 06/08/2020   PLT 188 06/08/2020   Lab Results  Component Value Date   ALT 42 06/08/2020   AST 25 06/08/2020   ALKPHOS 102 06/08/2020   BILITOT 0.9 06/08/2020     Review of Systems  Constitutional: Negative for fever, malaise/fatigue and weight loss.  Eyes: Negative for blurred vision.  Respiratory: Negative for shortness of breath.   Cardiovascular: Negative for chest pain, palpitations, orthopnea and PND.  Gastrointestinal: Negative for abdominal pain, anorexia, hematemesis, hematochezia and melena.  Musculoskeletal: Negative for myalgias and neck pain.  Skin: Negative for pallor.  Neurological: Negative for focal weakness, light-headedness, headaches and paresthesias.  Hematological: Does not bruise/bleed easily.  Psychiatric/Behavioral: Negative for confusion.    Patient Active Problem List   Diagnosis Date Noted  . Choledocholithiasis 06/08/2020  . Gastric ulcer: Per ERCP 06/05/2020 06/06/2020  . Angiodysplasia of stomach and duodenum: Per EGD 06/05/2020 06/06/2020  . Post-ERCP acute pancreatitis 06/06/2020  . Iron deficiency anemia due  to chronic blood loss   . Gastritis without bleeding   . Elevated liver enzymes   . Abnormal magnetic  resonance imaging of liver   . Abdominal pain   . Hypokalemia   . Elevated LFTs 06/03/2020  . Alcohol abuse 06/03/2020  . Overactive bladder 05/30/2018  . Chronic obstructive pulmonary disease (HCC) 05/30/2018  . Familial hypercholesterolemia 05/30/2018  . Flexural eczema 05/30/2018  . Cigarette nicotine dependence without complication 05/30/2018  . Atherosclerosis of aorta (HCC) 05/12/2017  . Screening for colon cancer   . Essential hypertension 06/12/2015  . Annual physical exam 08/05/2014    No Known Allergies  Past Surgical History:  Procedure Laterality Date  . CESAREAN SECTION    . COLONOSCOPY  2012   cleared for 5 yrs- [place]  . COLONOSCOPY WITH PROPOFOL N/A 11/02/2016   Procedure: COLONOSCOPY WITH PROPOFOL;  Surgeon: Toney Reil, MD;  Location: [facility];  Service: Gastroenterology;  Laterality: N/A;  . ERCP N/A 06/05/2020   Procedure: ENDOSCOPIC RETROGRADE CHOLANGIOPANCREATOGRAPHY (ERCP);  Surgeon: Midge Minium, MD;  Location: [facility] END[facility]COPY;  Service: Endoscopy;  Laterality: N/A;  . ESOPHAGOGASTRODUODENOSCOPY (EGD) WITH PROPOFOL N/A 06/05/2020   Procedure: ESOPHAGOGASTRODUODENOSCOPY (EGD) WITH PROPOFOL;  Surgeon: Midge Minium, MD;  Location: [facility] ENDOSCOPY;  Service: Endoscopy;  Laterality: N/A;    Social History   Tobacco Use  . Smoking status: Former Smoker    Packs/day: 0.00    Types: Cigarettes    Quit date: 06/09/2020    Years since quitting: 0.0  . Smokeless tobacco: Never Used  . Tobacco comment: gave info on patches and pills  Vaping Use  . Vaping Use: Never used  Substance Use Topics  . Alcohol use: Not Currently    Alcohol/week: 3.0 standard drinks    Types: 3 Glasses of wine per week  . Drug use: No     Medication list has been reviewed and updated.  Current Meds  Medication Sig  . albuterol (VENTOLIN HFA) 108 (90 Base) MCG/ACT inhaler Inhale 2 puffs into the lungs every 6 (six) hours as needed for wheezing or shortness of  breath.  Melene Muller ON 06/22/2020] atorvastatin (LIPITOR) 10 MG tablet Take 1 tablet (10 mg total) by mouth daily.  . B Complex Vitamins (B COMPLEX PO) Take by mouth daily.  . Biotin 10 MG TABS Take 1 tablet by mouth daily.  . carbamide peroxide (DEBROX) 6.5 % OTIC solution Place 5 drops into both ears 2 (two) times daily.  . cholecalciferol (VITAMIN D) 1000 UNITS tablet Take 1,000 Units by mouth daily.  . diclofenac Sodium (VOLTAREN) 1 % GEL Apply 2 g topically 4 (four) times daily.  . fluticasone furoate-vilanterol (BREO ELLIPTA) 100-25 MCG/INH AEPB Inhale 1 puff into the lungs in the morning.  . hydrochlorothiazide (HYDRODIURIL) 12.5 MG tablet Take 1 tablet (12.5 mg total) by mouth daily.  . metoprolol succinate (TOPROL-XL) 25 MG 24 hr tablet Take 1 tablet (25 mg total) by mouth daily.  . Multiple Vitamins-Minerals (MULTIVITAMIN WOMEN 50+ PO) Take 1 tablet by mouth daily.  . Omega-3 Fatty Acids (FISH OIL) 1000 MG CAPS Take 1 capsule (1,000 mg total) by mouth daily.  Marland Kitchen oxybutynin (DITROPAN-XL) 5 MG 24 hr tablet Take 1 tablet (5 mg total) by mouth at bedtime.  . pantoprazole (PROTONIX) 40 MG tablet Take 1 tablet (40 mg total) by mouth 2 (two) times daily before a meal.  . thiamine 100 MG tablet Take 1 tablet (100 mg total) by mouth daily.  Marland Kitchen triamcinolone ointment (  KENALOG) 0.5 % Apply 1 application topically 2 (two) times daily.  . vitamin B-12 (CYANOCOBALAMIN) 1000 MCG tablet Take 1 tablet (1,000 mcg total) by mouth daily.  . vitamin C (ASCORBIC ACID) 250 MG tablet Take 250 mg by mouth daily.    PHQ 2/9 Scores 06/19/2020 03/19/2020 02/04/2020 01/28/2020  PHQ - 2 Score 0 0 0 0  PHQ- 9 Score 0 0 0 0    GAD 7 : Generalized Anxiety Score 06/19/2020 03/19/2020 02/04/2020 01/28/2020  Nervous, Anxious, on Edge 0 0 0 0  Control/stop worrying 0 0 0 0  Worry too much - different things 0 0 0 0  Trouble relaxing 0 0 0 0  Restless 0 0 0 0  Easily annoyed or irritable 0 0 0 0  Afraid - awful might  happen 0 0 0 0  Total GAD 7 Score 0 0 0 0  Anxiety Difficulty - - Not difficult at all -    BP Readings from Last 3 Encounters:  06/19/20 120/80  06/08/20 122/69  04/08/20 120/80    Physical Exam  Wt Readings from Last 3 Encounters:  06/19/20 121 lb (54.9 kg)  06/03/20 134 lb (60.8 kg)  04/08/20 131 lb (59.4 kg)    BP 120/80   Pulse 64   Ht  (1.549 m)   Wt 121 lb (54.9 kg)   BMI 22.86 kg/m   Assessment and Plan: [facility]                                   Transitional Care [facility] Discharge Acute Issues Care Follow Up                                                                        Patient Demographics  Zan Triska, is a 60 y.o. female  DOB 12-08-1960  MRN [id number].  Primary MD  Duanne Limerick, MD   Reason for TCC follow Up -as noted above our responsibility was her anemia and correction by iron if necessary.  Also we are to check her electrolytes and GFR to see if they have returned to normal.  We have also have responsibility to look at her lipid concerns and make suggestions as far as her dietary approach.   Past Medical History:  Diagnosis Date  . Hypertension   . Wears dentures    full upper, partial lower    Past Surgical History:  Procedure Laterality Date  . CESAREAN SECTION    . COLONOSCOPY  2012   cleared for 5 yrs- [place]  . COLONOSCOPY WITH PROPOFOL N/A 11/02/2016   Procedure: COLONOSCOPY WITH PROPOFOL;  Surgeon: Toney Reil, MD;  Location[facility];  Service: Gastroenterology;  Laterality: N/A;  . ERCP N/A 06/05/2020   Procedure: ENDOSCOPIC RETROGRADE CHOLANGIOPANCREATOGRAPHY (ERCP);  Surgeon: Midge Minium, MD;  Location: [facility] END[facility]COPY;  Service: Endoscopy;  Laterality: N/A;  . ESOPHAGOGASTRODUODENOSCOPY (EGD) WITH PROPOFOL N/A 06/05/2020   Procedure: ESOPHAGOGASTRODUODENOSCOPY (EGD) WITH PROPOFOL;  Surgeon: Midge Minium, MD;  Location: [facility] ENDOSCOPY;   Service: Endoscopy;  Laterality: N/A;       Recent HPI and  Hospital Course  Patient underwent surgical procedure for removal gallbladder and is doing relatively well with mild tenderness on examination.  There is no change in her bowel movements other than she is suggested that they look like we will check her hepatic function to assess whether or not there is any bilirubin concerns.  Post Hospital Acute Care Issue to be followed in the Clinic   Acute care as discussed above involves her anemia, lipids, and hypertension.   Subjective:   Nigel Bridgeman today has, No headache, No chest pain, No abdominal pain - No Nausea, No new weakness tingling or numbness, No Cough - SOB.   Assessment & Plan    1. Hospital discharge follow-up Patient seen for hospital discharge follow-up 2 weeks after discharge.  Patient was admitted for choledocho lithiasis which underwent robotic surgery and is doing relatively well.  I responsibilities are to recheck her as far as her anemia, hypertension, and hyperlipidemia.  2. Familial hypercholesterolemia Chronic.  Controlled.  Stable.  Patient has only been eating soups and has been afraid to eat things that may be cholesterol laden.  Patient has been given a guidelines sheet for cholesterol and high triglyceride eating plan.  We will check a lipid panel.  I think she probably is holding her atorvastatin before starting it upon recheck of her lipids will dictate a to what extent we will resume this. - Lipid Panel With LDL/HDL Ratio  3. Essential hypertension Chronic.  Controlled.  Stable.  Electrolytes in particular potassium and GFR were affected during her hospital admission.  We will check a renal function panel for current status. - Renal Function Panel  4. Iron deficiency anemia due to chronic blood loss Chronic.  Controlled.  Stable.  Patient has had a history of iron deficiency anemia and is being followed by GI as far as gastritis and possible  peptic ulcer concerns.  We will check a CBC with differential as well as serum ferritin for current status of iron deficiency anemia. - CBC with Differential/Platelet - Ferritin  5. Elevated LFTs Elevated transaminases in the past and we will check a hepatic function panel to see if these have returned to normal since surgery. - Hepatic Function Panel (6)  6. Hypomagnesemia Patient has noted to have had decreased magnesium levels and current levels will be checked today- Magnesium  7. Hypokalemia Electrolytes noted that there was a decrease in potassium and renal function panel will be done today - Renal Function Panel    Reason for frequent admissions/ER visits none has been recorded at this time.      Objective:   Vitals:   06/19/20 1327  BP: 120/80  Pulse: 64  Weight: 121 lb (54.9 kg)  Height: 5\' 1"  (1.549 m)    Wt Readings from Last 3 Encounters:  06/19/20 121 lb (54.9 kg)  06/03/20 134 lb (60.8 kg)  04/08/20 131 lb (59.4 kg)    Allergies as of 06/19/2020   No Known Allergies     Medication List       Accurate as of Jun 19, 2020  2:23 PM. If you have any questions, ask your nurse or doctor.        albuterol 108 (90 Base) MCG/ACT inhaler Commonly known as: VENTOLIN HFA Inhale 2 puffs into the lungs every 6 (six) hours as needed for wheezing or shortness of breath.   atorvastatin 10 MG tablet Commonly known as: LIPITOR Take 1 tablet (10 mg total) by mouth daily. Start taking on: Jun 22, 2020   B COMPLEX PO Take by mouth daily.   Biotin 10 MG Tabs Take 1 tablet by mouth daily.   carbamide peroxide 6.5 % OTIC solution Commonly known as: DEBROX Place 5 drops into both ears 2 (two) times daily.   cholecalciferol 1000 units tablet Commonly known as: VITAMIN D Take 1,000 Units by mouth daily.   diclofenac Sodium 1 % Gel Commonly known as: Voltaren Apply 2 g topically 4 (four) times daily.   Fish Oil 1000 MG Caps Take 1 capsule (1,000 mg total) by  mouth daily.   fluticasone furoate-vilanterol 100-25 MCG/INH Aepb Commonly known as: BREO ELLIPTA Inhale 1 puff into the lungs in the morning.   hydrochlorothiazide 12.5 MG tablet Commonly known as: HYDRODIURIL Take 1 tablet (12.5 mg total) by mouth daily.   metoprolol succinate 25 MG 24 hr tablet Commonly known as: TOPROL-XL Take 1 tablet (25 mg total) by mouth daily.   MULTIVITAMIN WOMEN 50+ PO Take 1 tablet by mouth daily.   oxybutynin 5 MG 24 hr tablet Commonly known as: DITROPAN-XL Take 1 tablet (5 mg total) by mouth at bedtime.   pantoprazole 40 MG tablet Commonly known as: Protonix Take 1 tablet (40 mg total) by mouth 2 (two) times daily before a meal.   thiamine 100 MG tablet Take 1 tablet (100 mg total) by mouth daily.   triamcinolone ointment 0.5 % Commonly known as: KENALOG Apply 1 application topically 2 (two) times daily.   vitamin B-12 1000 MCG tablet Commonly known as: CYANOCOBALAMIN Take 1 tablet (1,000 mcg total) by mouth daily.   vitamin C 250 MG tablet Commonly known as: ASCORBIC ACID Take 250 mg by mouth daily.        Physical Exam: Constitutional: Patient appears well-developed and well-nourished. Not in obvious distress. HENT: Normocephalic, atraumatic, External right and left ear normal. Oropharynx is clear and moist.  Eyes: Conjunctivae and EOM are normal. PERRLA, no scleral icterus. Neck: Normal ROM. Neck supple. No JVD. No tracheal deviation. No thyromegaly. CVS: RRR, S1/S2 +, no murmurs, no gallops, no carotid bruit.  Pulmonary: Effort and breath sounds normal, no stridor, rhonchi, wheezes, rales.  Abdominal: Soft. BS +, no distension, tenderness, rebound or guarding.  Musculoskeletal: Normal range of motion. No edema and no tenderness.  Lymphadenopathy: No lymphadenopathy noted, cervical, inguinal or axillary Neuro: Alert. Normal reflexes, muscle tone coordination. No cranial nerve deficit. Skin: Skin is warm and dry. No rash noted.  Not diaphoretic. No erythema. No pallor. Psychiatric: Normal mood and affect. Behavior, judgment, thought content normal.   Data Review   Micro Results No results found for this or any previous visit (from the past 240 hour(s)).   CBC No results for input(s): WBC, HGB, HCT, PLT, MCV, MCH, MCHC, RDW, LYMPHSABS, MONOABS, EOSABS, BASOSABS, BANDABS in the last 168 hours.  Invalid input(s): NEUTRABS, BANDSABD  Chemistries  No results for input(s): NA, K, CL, CO2, GLUCOSE, BUN, CREATININE, CALCIUM, MG, AST, ALT, ALKPHOS, BILITOT in the last 168 hours.  Invalid input(s): GFRCGP ------------------------------------------------------------------------------------------------------------------ estimated creatinine clearance is 55.7 mL/min (by C-G formula based on SCr of 0.62 mg/dL). ------------------------------------------------------------------------------------------------------------------ No results for input(s): HGBA1C in the last 72 hours. ------------------------------------------------------------------------------------------------------------------ No results for input(s): CHOL, HDL, LDLCALC, TRIG, CHOLHDL, LDLDIRECT in the last 72 hours. ------------------------------------------------------------------------------------------------------------------ No results for input(s): TSH, T4TOTAL, T3FREE, THYROIDAB in the last 72 hours.  Invalid input(s): FREET3 ------------------------------------------------------------------------------------------------------------------ No results for input(s): VITAMINB12, FOLATE, FERRITIN, TIBC, IRON, RETICCTPCT in the last 72 hours.  Coagulation profile No results  for input(s): INR, PROTIME in the last 168 hours.  No results for input(s): DDIMER in the last 72 hours.  Cardiac Enzymes No results for input(s): CKMB, TROPONINI, MYOGLOBIN in the last 168 hours.  Invalid input(s):  CK ------------------------------------------------------------------------------------------------------------------ Invalid input(s): POCBNP   Time Spent in minutes  45   Elizabeth Sauer M.D on 06/19/2020 at 2:23 PM

## 2020-06-20 LAB — LIPID PANEL WITH LDL/HDL RATIO
Cholesterol, Total: 297 mg/dL — ABNORMAL HIGH (ref 100–199)
HDL: 49 mg/dL (ref >39)
LDL Chol Calc (NIH): 227 mg/dL — ABNORMAL HIGH (ref 0–99)
LDL/HDL Ratio: 4.6 ratio — ABNORMAL HIGH (ref 0.0–3.2)
Triglycerides: 119 mg/dL (ref 0–149)
VLDL Cholesterol Cal: 21 mg/dL (ref 5–40)

## 2020-06-20 LAB — CBC WITH DIFFERENTIAL/PLATELET
Basophils Absolute: 0.1 10*3/uL (ref 0.0–0.2)
Basos: 1 %
EOS (ABSOLUTE): 0.3 10*3/uL (ref 0.0–0.4)
Eos: 6 %
Hematocrit: 35.1 % (ref 34.0–46.6)
Hemoglobin: 11.9 g/dL (ref 11.1–15.9)
Immature Grans (Abs): 0 10*3/uL (ref 0.0–0.1)
Immature Granulocytes: 0 %
Lymphocytes Absolute: 1.2 10*3/uL (ref 0.7–3.1)
Lymphs: 22 %
MCH: 29.2 pg (ref 26.6–33.0)
MCHC: 33.9 g/dL (ref 31.5–35.7)
MCV: 86 fL (ref 79–97)
Monocytes Absolute: 0.5 10*3/uL (ref 0.1–0.9)
Monocytes: 10 %
Neutrophils Absolute: 3.2 10*3/uL (ref 1.4–7.0)
Neutrophils: 61 %
Platelets: 428 10*3/uL (ref 150–450)
RBC: 4.07 x10E6/uL (ref 3.77–5.28)
RDW: 10.6 % — ABNORMAL LOW (ref 11.7–15.4)
WBC: 5.3 10*3/uL (ref 3.4–10.8)

## 2020-06-20 LAB — RENAL FUNCTION PANEL
Albumin: 4.8 g/dL (ref 3.8–4.8)
BUN/Creatinine Ratio: 21 (ref 12–28)
BUN: 19 mg/dL (ref 8–27)
CO2: 24 mmol/L (ref 20–29)
Calcium: 10.9 mg/dL — ABNORMAL HIGH (ref 8.7–10.3)
Chloride: 95 mmol/L — ABNORMAL LOW (ref 96–106)
Creatinine, Ser: 0.92 mg/dL (ref 0.57–1.00)
Glucose: 93 mg/dL (ref 65–99)
Phosphorus: 5.5 mg/dL — ABNORMAL HIGH (ref 3.0–4.3)
Potassium: 5.1 mmol/L (ref 3.5–5.2)
Sodium: 137 mmol/L (ref 134–144)
eGFR: 71 mL/min/{1.73_m2} (ref >59)

## 2020-06-20 LAB — HEPATIC FUNCTION PANEL (6)
ALT: 17 IU/L (ref 0–32)
AST: 12 IU/L (ref 0–40)
Alkaline Phosphatase: 118 IU/L (ref 44–121)
Bilirubin Total: 0.3 mg/dL (ref 0.0–1.2)
Bilirubin, Direct: 0.14 mg/dL (ref 0.00–0.40)

## 2020-06-20 LAB — MAGNESIUM: Magnesium: 1.9 mg/dL (ref 1.6–2.3)

## 2020-06-20 LAB — FERRITIN: Ferritin: 244 ng/mL — ABNORMAL HIGH (ref 15–150)

## 2020-07-04 ENCOUNTER — Encounter: Payer: Self-pay | Admitting: Gastroenterology

## 2020-07-04 ENCOUNTER — Other Ambulatory Visit: Payer: Self-pay

## 2020-07-04 ENCOUNTER — Ambulatory Visit (INDEPENDENT_AMBULATORY_CARE_PROVIDER_SITE_OTHER): Payer: BC Managed Care – PPO | Admitting: Gastroenterology

## 2020-07-04 VITALS — BP 123/79 | HR 83 | Temp 97.7°F | Ht 61.0 in | Wt 122.0 lb

## 2020-07-04 DIAGNOSIS — Z9889 Other specified postprocedural states: Secondary | ICD-10-CM

## 2020-07-04 DIAGNOSIS — K8043 Calculus of bile duct with acute cholecystitis with obstruction: Secondary | ICD-10-CM | POA: Diagnosis not present

## 2020-07-04 DIAGNOSIS — Z9049 Acquired absence of other specified parts of digestive tract: Secondary | ICD-10-CM | POA: Diagnosis not present

## 2020-07-04 DIAGNOSIS — K31819 Angiodysplasia of stomach and duodenum without bleeding: Secondary | ICD-10-CM

## 2020-07-04 NOTE — Progress Notes (Signed)
Arlyss Repress, MD [address]  [address]  [place], [place] [address]  Main: [phone]  Fax: [phone]    Gastroenterology Consultation  Referring Provider:     Duanne Limerick, MD Primary Care Physician:  Duanne Limerick, MD Primary Gastroenterologist:  Dr. Arlyss Repress Reason for Consultation:     History of choledocholithiasis, hospital follow-up        HPI:   Samantha Irwin is a 60 y.o. female referred by Dr. Duanne Limerick, MD  for consultation & management of recent history of choledocholithiasis requiring hospital admission.  Patient underwent ERCP with sphincterotomy and stone extraction followed by mild post ERCP pancreatitis.  She underwent laparoscopic cholecystectomy.  Her LFTs returned to normal after ERCP and cholecystectomy.  Patient is doing well since discharge.  She stopped drinking alcohol.  She was also noted to have gastric and duodenal nonbleeding AVM which was cauterized as well.  There was also a small clean-based prepyloric ulcer.  She is currently taking Protonix 40 mg twice daily.  Patient denies any symptoms.  Her anemia has resolved.  NSAIDs: None  Antiplts/Anticoagulants/Anti thrombotics: None  GI Procedures: Colonoscopy 2018 normal No family history of GI malignancy  Past Medical History:  Diagnosis Date  . Angiodysplasia of stomach and duodenum: Per EGD 06/05/2020 06/06/2020  . Choledocholithiasis 06/08/2020  . Gastric ulcer: Per ERCP 06/05/2020 06/06/2020  . Gastritis without bleeding   . Hypertension   . Post-ERCP acute pancreatitis 06/06/2020  . Wears dentures    full upper, partial lower    Past Surgical History:  Procedure Laterality Date  . CESAREAN SECTION    . COLONOSCOPY  2012   cleared for 5 yrs- [place]  . COLONOSCOPY WITH PROPOFOL N/A 11/02/2016   Procedure: COLONOSCOPY WITH PROPOFOL;  Surgeon: Toney Reil, MD;  Location: [facility];  Service: Gastroenterology;  Laterality: N/A;  . ERCP N/A  06/05/2020   Procedure: ENDOSCOPIC RETROGRADE CHOLANGIOPANCREATOGRAPHY (ERCP);  Surgeon: Midge Minium, MD;  Location: [facility] END[facility]COPY;  Service: Endoscopy;  Laterality: N/A;  . ESOPHAGOGASTRODUODENOSCOPY (EGD) WITH PROPOFOL N/A 06/05/2020   Procedure: ESOPHAGOGASTRODUODENOSCOPY (EGD) WITH PROPOFOL;  Surgeon: Midge Minium, MD;  Location: [facility] ENDOSCOPY;  Service: Endoscopy;  Laterality: N/A;    Current Outpatient Medications:  .  albuterol (VENTOLIN HFA) 108 (90 Base) MCG/ACT inhaler, Inhale 2 puffs into the lungs every 6 (six) hours as needed for wheezing or shortness of breath., Disp: 1 Inhaler, Rfl: 11 .  atorvastatin (LIPITOR) 10 MG tablet, Take 1 tablet (10 mg total) by mouth daily., Disp: 90 tablet, Rfl: 1 .  B Complex Vitamins (B COMPLEX PO), Take by mouth daily., Disp: , Rfl:  .  Biotin 10 MG TABS, Take 1 tablet by mouth daily., Disp: , Rfl:  .  carbamide peroxide (DEBROX) 6.5 % OTIC solution, Place 5 drops into both ears 2 (two) times daily., Disp: 15 mL, Rfl: 0 .  cholecalciferol (VITAMIN D) 1000 UNITS tablet, Take 1,000 Units by mouth daily., Disp: , Rfl:  .  diclofenac Sodium (VOLTAREN) 1 % GEL, Apply 2 g topically 4 (four) times daily., Disp: 150 g, Rfl: 0 .  fluticasone furoate-vilanterol (BREO ELLIPTA) 100-25 MCG/INH AEPB, Inhale 1 puff into the lungs in the morning., Disp: 28 each, Rfl: 5 .  hydrochlorothiazide (HYDRODIURIL) 12.5 MG tablet, Take 1 tablet (12.5 mg total) by mouth daily., Disp: 90 tablet, Rfl: 1 .  metoprolol succinate (TOPROL-XL) 25 MG 24 hr tablet, Take 1 tablet (25 mg total) by  mouth daily., Disp: 90 tablet, Rfl: 0 .  Multiple Vitamins-Minerals (MULTIVITAMIN WOMEN 50+ PO), Take 1 tablet by mouth daily., Disp: , Rfl:  .  Omega-3 Fatty Acids (FISH OIL) 1000 MG CAPS, Take 1 capsule (1,000 mg total) by mouth daily., Disp: 100 capsule, Rfl: 3 .  oxybutynin (DITROPAN-XL) 5 MG 24 hr tablet, Take 1 tablet (5 mg total) by mouth at bedtime., Disp: 90 tablet, Rfl: 1 .   pantoprazole (PROTONIX) 40 MG tablet, Take 1 tablet (40 mg total) by mouth 2 (two) times daily before a meal., Disp: 60 tablet, Rfl: 3 .  thiamine 100 MG tablet, Take 1 tablet (100 mg total) by mouth daily., Disp: , Rfl:  .  triamcinolone ointment (KENALOG) 0.5 %, Apply 1 application topically 2 (two) times daily., Disp: 30 g, Rfl: 0 .  vitamin B-12 (CYANOCOBALAMIN) 1000 MCG tablet, Take 1 tablet (1,000 mcg total) by mouth daily., Disp: , Rfl:  .  vitamin C (ASCORBIC ACID) 250 MG tablet, Take 250 mg by mouth daily., Disp: , Rfl:    Family History  Problem Relation Age of Onset  . Diabetes Mother   . Breast cancer Neg Hx      Social History   Tobacco Use  . Smoking status: Former Smoker    Packs/day: 0.00    Types: Cigarettes    Quit date: 06/09/2020    Years since quitting: 0.0  . Smokeless tobacco: Never Used  . Tobacco comment: gave info on patches and pills  Vaping Use  . Vaping Use: Never used  Substance Use Topics  . Alcohol use: Not Currently    Alcohol/week: 3.0 standard drinks    Types: 3 Glasses of wine per week  . Drug use: No    Allergies as of 07/04/2020  . (No Known Allergies)    Review of Systems:    All systems reviewed and negative except where noted in HPI.   Physical Exam:  BP 123/79 (BP Location: Left Arm, Patient Position: Sitting, Cuff Size: Normal)   Pulse 83   Temp 97.7 F (36.5 C) (Oral)   Ht 5\' 1"  (1.549 m)   Wt 122 lb (55.3 kg)   BMI 23.05 kg/m  No LMP recorded. Patient is postmenopausal.  General:   Alert,  Well-developed, well-nourished, pleasant and cooperative in NAD Head:  Normocephalic and atraumatic. Eyes:  Sclera clear, no icterus.   Conjunctiva pink. Ears:  Normal auditory acuity. Nose:  No deformity, discharge, or lesions. Mouth:  No deformity or lesions,oropharynx pink & moist. Neck:  Supple; no masses or thyromegaly. Lungs:  Respirations even and unlabored.  Clear throughout to auscultation.   No wheezes, crackles, or  rhonchi. No acute distress. Heart:  Regular rate and rhythm; no murmurs, clicks, rubs, or gallops. Abdomen:  Normal bowel sounds. Soft, non-tender and non-distended without masses, hepatosplenomegaly or hernias noted.  No guarding or rebound tenderness.   Rectal: Not performed Msk:  Symmetrical without gross deformities. Good, equal movement & strength bilaterally. Pulses:  Normal pulses noted. Extremities:  No clubbing or edema.  No cyanosis. Neurologic:  Alert and oriented x3;  grossly normal neurologically. Skin:  Intact without significant lesions or rashes. No jaundice. Psych:  Alert and cooperative. Normal mood and affect.  Imaging Studies: Reviewed  Assessment and Plan:   Samantha Irwin is a 60 y.o. pleasant African-American female with hypertension, history of choledocholithiasis, acute cholecystitis s/p ERCP with biliary sphincterotomy and bile duct stone extraction and laparoscopic cholecystectomy.  History of gastric and  duodenal AVMs, s/p APC, history of small clean-based prepyloric ulcer, most likely secondary to alcohol use  Symptoms have currently resolved, LFTs normal Continue to remain abstinent from heavy alcohol use  No evidence of anemia, no further work-up is indicated at this time Decrease Protonix 40 mg to once a day for 1 month and then stop  Follow up as needed   Arlyss Repress, MD

## 2020-07-21 ENCOUNTER — Emergency Department
Admission: EM | Admit: 2020-07-21 | Discharge: 2020-07-21 | Disposition: A | Discharge status: Home / Self Care | Payer: BC Managed Care – PPO | Attending: Emergency Medicine | Admitting: Emergency Medicine

## 2020-07-21 ENCOUNTER — Emergency Department: Payer: BC Managed Care – PPO

## 2020-07-21 ENCOUNTER — Other Ambulatory Visit: Payer: Self-pay

## 2020-07-21 ENCOUNTER — Encounter: Payer: Self-pay | Admitting: Emergency Medicine

## 2020-07-21 DIAGNOSIS — W1839XA Other fall on same level, initial encounter: Secondary | ICD-10-CM | POA: Diagnosis not present

## 2020-07-21 DIAGNOSIS — Z79899 Other long term (current) drug therapy: Secondary | ICD-10-CM | POA: Diagnosis not present

## 2020-07-21 DIAGNOSIS — S82831A Other fracture of upper and lower end of right fibula, initial encounter for closed fracture: Secondary | ICD-10-CM | POA: Diagnosis not present

## 2020-07-21 DIAGNOSIS — Z87891 Personal history of nicotine dependence: Secondary | ICD-10-CM | POA: Diagnosis not present

## 2020-07-21 DIAGNOSIS — S99911A Unspecified injury of right ankle, initial encounter: Secondary | ICD-10-CM | POA: Diagnosis present

## 2020-07-21 DIAGNOSIS — Y92009 Unspecified place in unspecified non-institutional (private) residence as the place of occurrence of the external cause: Secondary | ICD-10-CM | POA: Diagnosis not present

## 2020-07-21 DIAGNOSIS — S93401A Sprain of unspecified ligament of right ankle, initial encounter: Secondary | ICD-10-CM

## 2020-07-21 DIAGNOSIS — R52 Pain, unspecified: Secondary | ICD-10-CM

## 2020-07-21 DIAGNOSIS — I10 Essential (primary) hypertension: Secondary | ICD-10-CM | POA: Diagnosis not present

## 2020-07-21 MED ORDER — OXYCODONE HCL 5 MG PO TABS: 5.0000 mg | ORAL_TABLET | Freq: Three times a day (TID) | ORAL | 0 refills | Status: AC | PRN

## 2020-07-21 MED ORDER — OXYCODONE HCL 5 MG PO TABS
5.0000 mg | ORAL_TABLET | Freq: Once | ORAL | Status: AC
Start: 2020-07-21 — End: 2020-07-21
  Administered 2020-07-21: 5 mg via ORAL
  Filled 2020-07-21: qty 1

## 2020-07-21 MED ORDER — HYDROCODONE-ACETAMINOPHEN 5-325 MG PO TABS: 1.0000 | ORAL_TABLET | Freq: Once | ORAL | Status: DC

## 2020-07-21 MED ORDER — ONDANSETRON 4 MG PO TBDP
4.0000 mg | ORAL_TABLET | Freq: Once | ORAL | Status: AC
  Administered 2020-07-21: 4 mg via ORAL
  Filled 2020-07-21: qty 1

## 2020-07-21 MED ORDER — CYCLOBENZAPRINE HCL 10 MG PO TABS
5.0000 mg | ORAL_TABLET | Freq: Once | ORAL | Status: AC
  Administered 2020-07-21: 5 mg via ORAL
  Filled 2020-07-21: qty 1

## 2020-07-21 NOTE — ED Provider Notes (Signed)
[facility] Emergency Department Provider Note ____________________________________________  Time seen: 0745  I have reviewed the triage vital signs and the nursing notes.  HISTORY  Chief Complaint  Ankle Injury   HPI Samantha Irwin is a 60 y.o. female with the below medical history, presents to the ED for evaluation of acute right ankle pain. She describes a mechanical fall at home. She tripped after a bathroom visit, this morning. She denies any head injury, LOC, or other injury at this time.  Patient has taken Tylenol prior to arrival.  Past Medical History:  Diagnosis Date   Angiodysplasia of stomach and duodenum: Per EGD 06/05/2020 06/06/2020   Choledocholithiasis 06/08/2020   Gastric ulcer: Per ERCP 06/05/2020 06/06/2020   Gastritis without bleeding    Hypertension    Post-ERCP acute pancreatitis 06/06/2020   Wears dentures    full upper, partial lower    Patient Active Problem List   Diagnosis Date Noted   Overactive bladder 05/30/2018   Chronic obstructive pulmonary disease (HCC) 05/30/2018   Familial hypercholesterolemia 05/30/2018   Flexural eczema 05/30/2018   Cigarette nicotine dependence without complication 05/30/2018   Atherosclerosis of aorta (HCC) 05/12/2017   Essential hypertension 06/12/2015    Past Surgical History:  Procedure Laterality Date   CESAREAN SECTION     COLONOSCOPY  2012   cleared for 5 yrs- [place]   COLONOSCOPY WITH PROPOFOL N/A 11/02/2016   Procedure: COLONOSCOPY WITH PROPOFOL;  Surgeon: Toney Reil, MD;  Location: [facility];  Service: Gastroenterology;  Laterality: N/A;   ERCP N/A 06/05/2020   Procedure: ENDOSCOPIC RETROGRADE CHOLANGIOPANCREATOGRAPHY (ERCP);  Surgeon: Midge Minium, MD;  Location: [facility] END[facility];  Service: Endoscopy;  Laterality: N/A;   ESOPHAGOGASTRODUODENOSCOPY (EGD) WITH PROPOFOL N/A 06/05/2020   Procedure: ESOPHAGOGASTRODUODENOSCOPY (EGD) WITH PROPOFOL;  Surgeon: Midge Minium,  MD;  Location: [facility] ENDOSCOPY;  Service: Endoscopy;  Laterality: N/A;    Prior to Admission medications   Medication Sig Start Date End Date Taking? Authorizing Provider  oxyCODONE (ROXICODONE) 5 MG immediate release tablet Take 1 tablet (5 mg total) by mouth every 8 (eight) hours as needed for up to 5 days. 07/21/20 07/26/20 Yes Rendell Thivierge, Charlesetta Ivory, PA-C  albuterol (VENTOLIN HFA) 108 (90 Base) MCG/ACT inhaler Inhale 2 puffs into the lungs every 6 (six) hours as needed for wheezing or shortness of breath. 05/30/18   Duanne Limerick, MD  atorvastatin (LIPITOR) 10 MG tablet Take 1 tablet (10 mg total) by mouth daily. 06/22/20   Rodolph Bong, MD  B Complex Vitamins (B COMPLEX PO) Take by mouth daily.    [provider]  Biotin 10 MG TABS Take 1 tablet by mouth daily.    [provider]  carbamide peroxide (DEBROX) 6.5 % OTIC solution Place 5 drops into both ears 2 (two) times daily. 02/27/19   Duanne Limerick, MD  cholecalciferol (VITAMIN D) 1000 UNITS tablet Take 1,000 Units by mouth daily.    [provider]  diclofenac Sodium (VOLTAREN) 1 % GEL Apply 2 g topically 4 (four) times daily. 03/19/20   Duanne Limerick, MD  fluticasone furoate-vilanterol (BREO ELLIPTA) 100-25 MCG/INH AEPB Inhale 1 puff into the lungs in the morning. 02/04/20   Duanne Limerick, MD  hydrochlorothiazide (HYDRODIURIL) 12.5 MG tablet Take 1 tablet (12.5 mg total) by mouth daily. 04/08/20   Duanne Limerick, MD  metoprolol succinate (TOPROL-XL) 25 MG 24 hr tablet Take 1 tablet (25 mg total) by mouth daily. 06/09/20   Yetta Barre,  Deanna C, MD  Multiple Vitamins-Minerals (MULTIVITAMIN WOMEN 50+ PO) Take 1 tablet by mouth daily.    [provider]  Omega-3 Fatty Acids (FISH OIL) 1000 MG CAPS Take 1 capsule (1,000 mg total) by mouth daily. 04/08/20   Duanne Limerick, MD  oxybutynin (DITROPAN-XL) 5 MG 24 hr tablet Take 1 tablet (5 mg total) by mouth at bedtime. 08/24/19   Duanne Limerick, MD  pantoprazole  (PROTONIX) 40 MG tablet Take 1 tablet (40 mg total) by mouth 2 (two) times daily before a meal. 06/08/20 06/08/21  Rodolph Bong, MD  thiamine 100 MG tablet Take 1 tablet (100 mg total) by mouth daily. 06/09/20   Rodolph Bong, MD  triamcinolone ointment (KENALOG) 0.5 % Apply 1 application topically 2 (two) times daily. 05/30/18   Duanne Limerick, MD  vitamin B-12 (CYANOCOBALAMIN) 1000 MCG tablet Take 1 tablet (1,000 mcg total) by mouth daily. 06/08/20   Rodolph Bong, MD  vitamin C (ASCORBIC ACID) 250 MG tablet Take 250 mg by mouth daily.    [provider]    Allergies Patient has no known allergies.  Family History  Problem Relation Age of Onset   Diabetes Mother    Breast cancer Neg Hx     Social History Social History   Tobacco Use   Smoking status: Former    Packs/day: 0.00    Pack years: 0.00    Types: Cigarettes    Quit date: 06/09/2020    Years since quitting: 0.1   Smokeless tobacco: Never   Tobacco comments:    gave info on patches and pills  Vaping Use   Vaping Use: Never used  Substance Use Topics   Alcohol use: Not Currently    Alcohol/week: 3.0 standard drinks    Types: 3 Glasses of wine per week   Drug use: No    Review of Systems  Constitutional: Negative for fever. Eyes: Negative for visual changes. ENT: Negative for sore throat. Cardiovascular: Negative for chest pain. Respiratory: Negative for shortness of breath. Gastrointestinal: Negative for abdominal pain, vomiting and diarrhea. Genitourinary: Negative for dysuria. Musculoskeletal: Negative for back pain. Right ankle pain as above Skin: Negative for rash. Neurological: Negative for headaches, focal weakness or numbness. ____________________________________________  PHYSICAL EXAM:  VITAL SIGNS: ED Triage Vitals  Enc Vitals Group     BP 07/21/20 0725 139/90     Pulse Rate 07/21/20 0725 94     Resp 07/21/20 0725 18     Temp 07/21/20 0725 97.6 F (36.4 C)     Temp Source  07/21/20 0725 Oral     SpO2 07/21/20 0725 97 %     Weight 07/21/20 0723 121 lb 14.6 oz (55.3 kg)     Height 07/21/20 0723 5\' 1"  (1.549 m)     Head Circumference --      Peak Flow --      Pain Score 07/21/20 0723 7     Pain Loc --      Pain Edu? --      Excl. in GC? --     Constitutional: Alert and oriented. Well appearing and in no distress. Head: Normocephalic and atraumatic. Eyes: Conjunctivae are normal. Normal extraocular movements Cardiovascular: Normal rate, regular rhythm. Normal distal pulses. Respiratory: Normal respiratory effort. No wheezes/rales/rhonchi. Gastrointestinal: Soft and nontender. No distention. Musculoskeletal: Right ankle with lateral soft tissue swelling at the malleolus.  Patient with decreased ankle range of motion secondary to pain.  No significant calf or  Achilles tenderness is elicited.  Normal flexion of the toes distally.  Nontender with normal range of motion in all extremities.  Neurologic:  Normal gross sensation.  Normal speech and language. No gross focal neurologic deficits are appreciated. Skin:  Skin is warm, dry and intact. No rash noted. ____________________________________________   RADIOLOGY  DG Right Ankle  IMPRESSION: Nondisplaced fracture of the distal fibula.  I, Lissa Hoard, personally viewed and evaluated these images (plain radiographs) as part of my medical decision making, as well as reviewing the written report by the radiologist.  ____________________________________________  PROCEDURES  Roxicodone 5 mg PO Flexeril 5 mg PO Standard Walker  .Splint Application  Date/Time: 07/21/2020 9:47 AM Performed by: Fayrene Helper, NT Authorized by: Lissa Hoard, PA-C   Consent:    Consent obtained:  Verbal   Consent given by:  Patient   Risks, benefits, and alternatives were discussed: yes     Risks discussed:  Pain Universal protocol:    Procedure explained and questions answered to patient or  proxy's satisfaction: yes     Imaging studies available: yes     Site/side marked: yes     Immediately prior to procedure a time out was called: yes     Patient identity confirmed:  Verbally with patient Pre-procedure details:    Distal neurologic exam:  Normal   Distal perfusion: distal pulses strong   Procedure details:    Location:  Ankle   Ankle location:  R ankle   Strapping: no     Splint type:  Short leg   Supplies:  Cotton padding, elastic bandage and fiberglass   Attestation: Splint applied and adjusted personally by me   Post-procedure details:    Distal neurologic exam:  Normal   Distal perfusion: distal pulses strong     Procedure completion:  Tolerated well, no immediate complications   Post-procedure imaging: not applicable   ____________________________________________   INITIAL IMPRESSION / ASSESSMENT AND PLAN / ED COURSE  As part of my medical decision making, I reviewed the following data within the electronic MEDICAL RECORD NUMBER Radiograph reviewed as above and Notes from prior ED visits   Geriatric patient with ED evaluation and initial fracture care for a closed nondisplaced distal fibula fracture.  Patient was evaluated for complaints following mechanical fall at home.  She was placed into appropriate OCL splint, discharged with referral to podiatry for further fracture care.  Return precautions have been   Samantha Irwin was evaluated in Emergency Department on 07/21/2020 for the symptoms described in the history of present illness. She was evaluated in the context of the global COVID-19 pandemic, which necessitated consideration that the patient might be at risk for infection with the SARS-CoV-2 virus that causes COVID-19. Institutional protocols and algorithms that pertain to the evaluation of patients at risk for COVID-19 are in a state of rapid change based on information released by regulatory bodies including the CDC and federal and state organizations.  These policies and algorithms were followed during the patient's care in the ED. ____________________________________________  FINAL CLINICAL IMPRESSION(S) / ED DIAGNOSES  Final diagnoses:  Sprain of right ankle, unspecified ligament, initial encounter  Closed fracture of distal end of right fibula, unspecified fracture morphology, initial encounter      Lissa Hoard, PA-C 07/21/20 0949    Shaune Pollack, MD 07/21/20 [phone]

## 2020-07-21 NOTE — ED Triage Notes (Signed)
Fall this morning, twisted right ankle. C/O right ankle pain.

## 2020-07-21 NOTE — Discharge Instructions (Signed)
You are being treated for a non-displaced ankle fracture. Wear the custom brace until you are evaluated by Podiatry. Rest with the foot elevated and apply ice over the splint. Take th pain medicine as needed. Use the walker to get around with just touching the toes for balance.

## 2020-07-21 NOTE — ED Notes (Signed)
Patient arrived from xray. 10+/10 pain in left ankle s/p fall at home 04:00 ankle is swollen with limited movement of extremity secondary to pain.  Patient unable to bear weight on left foot. Patient took 3 tylenol thinks they were regular strength from time of fall to transport to ED

## 2020-07-21 NOTE — ED Notes (Signed)
Ortho glass splint applied to left ankle by laura, ED tech

## 2020-09-24 ENCOUNTER — Other Ambulatory Visit: Payer: Self-pay

## 2020-09-24 ENCOUNTER — Ambulatory Visit: Payer: BC Managed Care – PPO | Admitting: Family Medicine

## 2020-09-24 ENCOUNTER — Encounter: Payer: Self-pay | Admitting: Family Medicine

## 2020-09-24 VITALS — BP 120/70 | HR 76 | Ht 61.0 in | Wt 119.0 lb

## 2020-09-24 DIAGNOSIS — H6123 Impacted cerumen, bilateral: Secondary | ICD-10-CM | POA: Diagnosis not present

## 2020-09-24 DIAGNOSIS — N3281 Overactive bladder: Secondary | ICD-10-CM | POA: Diagnosis not present

## 2020-09-24 DIAGNOSIS — E7801 Familial hypercholesterolemia: Secondary | ICD-10-CM | POA: Diagnosis not present

## 2020-09-24 DIAGNOSIS — I10 Essential (primary) hypertension: Secondary | ICD-10-CM | POA: Diagnosis not present

## 2020-09-24 DIAGNOSIS — K219 Gastro-esophageal reflux disease without esophagitis: Secondary | ICD-10-CM

## 2020-09-24 DIAGNOSIS — F5101 Primary insomnia: Secondary | ICD-10-CM

## 2020-09-24 MED ORDER — METOPROLOL SUCCINATE ER 25 MG PO TB24: 25.0000 mg | ORAL_TABLET | Freq: Every day | ORAL | 1 refills | Status: DC

## 2020-09-24 MED ORDER — HYDROCHLOROTHIAZIDE 12.5 MG PO TABS: 12.5000 mg | ORAL_TABLET | Freq: Every day | ORAL | 1 refills | Status: DC

## 2020-09-24 MED ORDER — CARBAMIDE PEROXIDE 6.5 % OT SOLN: 5.0000 [drp] | Freq: Two times a day (BID) | OTIC | 0 refills | Status: DC

## 2020-09-24 MED ORDER — PANTOPRAZOLE SODIUM 40 MG PO TBEC: 40.0000 mg | DELAYED_RELEASE_TABLET | Freq: Two times a day (BID) | ORAL | 1 refills | Status: DC

## 2020-09-24 MED ORDER — TRAZODONE HCL 50 MG PO TABS: 25.0000 mg | ORAL_TABLET | Freq: Every day | ORAL | 3 refills | Status: DC

## 2020-09-24 MED ORDER — ATORVASTATIN CALCIUM 10 MG PO TABS: 10.0000 mg | ORAL_TABLET | Freq: Every day | ORAL | 1 refills | Status: DC

## 2020-09-24 MED ORDER — OXYBUTYNIN CHLORIDE ER 5 MG PO TB24: 5.0000 mg | ORAL_TABLET | Freq: Every day | ORAL | 1 refills | Status: DC

## 2020-09-24 NOTE — Progress Notes (Signed)
Date:  09/24/2020   Name:  Samantha Irwin   DOB:  27-Dec-1960   MRN:  [id number]   Chief Complaint: Gastroesophageal Reflux, Hypertension, overactive bladder, Hyperlipidemia, and Insomnia (Trouble sleeping- not getting but approx 3 hours a night)  Gastroesophageal Reflux She reports no abdominal pain, no belching, no chest pain, no choking, no coughing, no dysphagia, no heartburn, no hoarse voice, no nausea, no sore throat or no stridor. This is a chronic problem. The current episode started more than 1 year ago. The problem occurs rarely. The problem has been gradually improving. The symptoms are aggravated by certain foods. Pertinent negatives include no anemia, fatigue, melena, muscle weakness or orthopnea. She has tried a PPI for the symptoms.  Hypertension This is a chronic problem. The current episode started more than 1 year ago. The problem has been gradually improving since onset. The problem is controlled. Pertinent negatives include no anxiety, blurred vision, chest pain, headaches, malaise/fatigue, neck pain, orthopnea, palpitations, peripheral edema, PND or shortness of breath. Past treatments include beta blockers and diuretics. The current treatment provides mild improvement. There are no compliance problems.  There is no history of angina, kidney disease, CAD/MI, CVA, heart failure, left ventricular hypertrophy, PVD or retinopathy. There is no history of chronic renal disease, a hypertension causing med or renovascular disease.  Hyperlipidemia This is a chronic problem. The current episode started more than 1 year ago. The problem is controlled. Recent lipid tests were reviewed and are normal. She has no history of chronic renal disease, diabetes, hypothyroidism, liver disease or obesity. Factors aggravating her hyperlipidemia include thiazides. Pertinent negatives include no chest pain, leg pain, myalgias or shortness of breath. Current antihyperlipidemic treatment includes  statins. The current treatment provides moderate improvement of lipids. There are no compliance problems.   Insomnia Primary symptoms: no fragmented sleep, sleep disturbance, difficulty falling asleep, frequent awakening, premature morning awakening, no malaise/fatigue, no napping.   The current episode started more than one month. The onset quality is gradual. The problem occurs nightly. The problem is unchanged. How many beverages per day that contain caffeine: 0 - 1.  Nothing relieves the symptoms. Typical bedtime:  8-10 P.M..  How long after going to bed to you fall asleep: 30-60 minutes.     Lab Results  Component Value Date   CREATININE 0.92 06/19/2020   BUN 19 06/19/2020   NA 137 06/19/2020   K 5.1 06/19/2020   CL 95 (L) 06/19/2020   CO2 24 06/19/2020   Lab Results  Component Value Date   CHOL 297 (H) 06/19/2020   HDL 49 06/19/2020   LDLCALC 227 (H) 06/19/2020   TRIG 119 06/19/2020   CHOLHDL 3.2 05/30/2018   No results found for: TSH Lab Results  Component Value Date   HGBA1C 5.2 09/13/2016   Lab Results  Component Value Date   WBC 5.3 06/19/2020   HGB 11.9 06/19/2020   HCT 35.1 06/19/2020   MCV 86 06/19/2020   PLT 428 06/19/2020   Lab Results  Component Value Date   ALT 17 06/19/2020   AST 12 06/19/2020   ALKPHOS 118 06/19/2020   BILITOT 0.3 06/19/2020     Review of Systems  Constitutional:  Negative for fatigue and malaise/fatigue.  HENT:  Negative for hoarse voice and sore throat.   Eyes:  Negative for blurred vision.  Respiratory:  Negative for cough, choking and shortness of breath.   Cardiovascular:  Negative for chest pain, palpitations, orthopnea and PND.  Gastrointestinal:  Negative for abdominal pain, dysphagia, heartburn, melena and nausea.  Musculoskeletal:  Negative for myalgias, muscle weakness and neck pain.  Neurological:  Negative for headaches.  Psychiatric/Behavioral:  Positive for sleep disturbance. The patient has insomnia.    Patient  Active Problem List   Diagnosis Date Noted   Overactive bladder 05/30/2018   Chronic obstructive pulmonary disease (HCC) 05/30/2018   Familial hypercholesterolemia 05/30/2018   Flexural eczema 05/30/2018   Cigarette nicotine dependence without complication 05/30/2018   Atherosclerosis of aorta (HCC) 05/12/2017   Essential hypertension 06/12/2015    No Known Allergies  Past Surgical History:  Procedure Laterality Date   CESAREAN SECTION     COLONOSCOPY  2012   cleared for 5 yrs- [facility]   COLONOSCOPY WITH PROPOFOL N/A 11/02/2016   Procedure: COLONOSCOPY WITH PROPOFOL;  Surgeon: Toney Reil, MD;  Location: [facility];  Service: Gastroenterology;  Laterality: N/A;   ERCP N/A 06/05/2020   Procedure: ENDOSCOPIC RETROGRADE CHOLANGIOPANCREATOGRAPHY (ERCP);  Surgeon: Midge Minium, MD;  Location: [facility];  Service: Endoscopy;  Laterality: N/A;   ESOPHAGOGASTRODUODENOSCOPY (EGD) WITH PROPOFOL N/A 06/05/2020   Procedure: ESOPHAGOGASTRODUODENOSCOPY (EGD) WITH PROPOFOL;  Surgeon: Midge Minium, MD;  Location: [facility] END[facility];  Service: Endoscopy;  Laterality: N/A;    Social History   Tobacco Use   Smoking status: Former    Packs/day: 0.00    Types: Cigarettes    Quit date: 06/09/2020    Years since quitting: 0.2   Smokeless tobacco: Never   Tobacco comments:    gave info on patches and pills  Vaping Use   Vaping Use: Never used  Substance Use Topics   Alcohol use: Not Currently    Alcohol/week: 3.0 standard drinks    Types: 3 Glasses of wine per week   Drug use: No     Medication list has been reviewed and updated.  Current Meds  Medication Sig   albuterol (VENTOLIN HFA) 108 (90 Base) MCG/ACT inhaler Inhale 2 puffs into the lungs every 6 (six) hours as needed for wheezing or shortness of breath.   atorvastatin (LIPITOR) 10 MG tablet Take 1 tablet (10 mg total) by mouth daily.   B Complex Vitamins (B COMPLEX PO) Take by mouth daily.   Biotin 10 MG TABS Take 1  tablet by mouth daily.   carbamide peroxide (DEBROX) 6.5 % OTIC solution Place 5 drops into both ears 2 (two) times daily.   cholecalciferol (VITAMIN D) 1000 UNITS tablet Take 1,000 Units by mouth daily.   diclofenac Sodium (VOLTAREN) 1 % GEL Apply 2 g topically 4 (four) times daily.   fluticasone furoate-vilanterol (BREO ELLIPTA) 100-25 MCG/INH AEPB Inhale 1 puff into the lungs in the morning.   hydrochlorothiazide (HYDRODIURIL) 12.5 MG tablet Take 1 tablet (12.5 mg total) by mouth daily.   metoprolol succinate (TOPROL-XL) 25 MG 24 hr tablet Take 1 tablet (25 mg total) by mouth daily.   Multiple Vitamins-Minerals (MULTIVITAMIN WOMEN 50+ PO) Take 1 tablet by mouth daily.   Omega-3 Fatty Acids (FISH OIL) 1000 MG CAPS Take 1 capsule (1,000 mg total) by mouth daily.   oxybutynin (DITROPAN-XL) 5 MG 24 hr tablet Take 1 tablet (5 mg total) by mouth at bedtime.   pantoprazole (PROTONIX) 40 MG tablet Take 1 tablet (40 mg total) by mouth 2 (two) times daily before a meal.   thiamine 100 MG tablet Take 1 tablet (100 mg total) by mouth daily.   triamcinolone ointment (KENALOG) 0.5 % Apply 1 application topically 2 (two) times daily.  vitamin B-12 (CYANOCOBALAMIN) 1000 MCG tablet Take 1 tablet (1,000 mcg total) by mouth daily.   vitamin C (ASCORBIC ACID) 250 MG tablet Take 250 mg by mouth daily.    PHQ 2/9 Scores 09/24/2020 06/19/2020 03/19/2020 02/04/2020  PHQ - 2 Score 0 0 0 0  PHQ- 9 Score 7 0 0 0    GAD 7 : Generalized Anxiety Score 09/24/2020 06/19/2020 03/19/2020 02/04/2020  Nervous, Anxious, on Edge 1 0 0 0  Control/stop worrying 2 0 0 0  Worry too much - different things 2 0 0 0  Trouble relaxing 2 0 0 0  Restless 0 0 0 0  Easily annoyed or irritable 0 0 0 0  Afraid - awful might happen 0 0 0 0  Total GAD 7 Score 7 0 0 0  Anxiety Difficulty Not difficult at all - - Not difficult at all    BP Readings from Last 3 Encounters:  07/21/20 131/60  07/04/20 123/79  06/19/20 120/80    Physical  Exam Vitals and nursing note reviewed.  Constitutional:      Appearance: She is well-developed.  HENT:     Head: Normocephalic.     Right Ear: External ear normal. There is impacted cerumen.     Left Ear: External ear normal. There is impacted cerumen.  Eyes:     General: Lids are everted, no foreign bodies appreciated. No scleral icterus.       Left eye: No foreign body or hordeolum.     Conjunctiva/sclera: Conjunctivae normal.     Right eye: Right conjunctiva is not injected.     Left eye: Left conjunctiva is not injected.     Pupils: Pupils are equal, round, and reactive to light.  Neck:     Thyroid: No thyromegaly.     Vascular: No JVD.     Trachea: No tracheal deviation.  Cardiovascular:     Rate and Rhythm: Normal rate and regular rhythm.     Heart sounds: Normal heart sounds. No murmur heard.   No friction rub. No gallop.  Pulmonary:     Effort: Pulmonary effort is normal. No respiratory distress.     Breath sounds: Normal breath sounds. No wheezing or rales.  Abdominal:     General: Bowel sounds are normal.     Palpations: Abdomen is soft. There is no mass.     Tenderness: There is no abdominal tenderness. There is no guarding or rebound.  Musculoskeletal:        General: No tenderness. Normal range of motion.     Cervical back: Normal range of motion and neck supple.  Lymphadenopathy:     Cervical: No cervical adenopathy.  Skin:    General: Skin is warm.     Findings: No rash.  Neurological:     Mental Status: She is alert and oriented to person, place, and time.     Cranial Nerves: No cranial nerve deficit.     Deep Tendon Reflexes: Reflexes normal.  Psychiatric:        Mood and Affect: Mood is not anxious or depressed.    Wt Readings from Last 3 Encounters:  09/24/20 119 lb (54 kg)  07/21/20 121 lb 14.6 oz (55.3 kg)  07/04/20 122 lb (55.3 kg)    Ht 5\' 1"  (1.549 m)   Wt 119 lb (54 kg)   BMI 22.48 kg/m   Assessment and Plan:  1. Familial  hypercholesterolemia Chronic.  Controlled.  Stable.  Will check lipid panel today  referral status of LDL and continue on atorvastatin 10 mg 10 mg once a day with the possibility of increasing. - atorvastatin (LIPITOR) 10 MG tablet; Take 1 tablet (10 mg total) by mouth daily.  Dispense: 90 tablet; Refill: 1 - Lipid Panel With LDL/HDL Ratio  2. Essential hypertension .  Controlled.  Stable.  Blood pressure 120/70.  Continue hydrochlorothiazide 12.5 mg once a day and - hydrochlorothiazide (HYDRODIURIL) 12.5 MG tablet; Take 1 tablet (12.5 mg total) by mouth daily.  Dispense: 90 tablet; Refill: 1 - metoprolol succinate (25 mg once a day.  Will check renal function panel for electrolytes and GFR.) 25 MG 24 hr tablet; Take 1 tablet (25 mg total) by mouth daily.  Dispense: 90 tablet; Refill: 1 - Renal Function Panel  3. Overactive bladder Chronic.  Controlled.  Stable.  Continue oxybutynin XL 5 mg nightly - oxybutynin (DITROPAN-XL) 5 MG 24 hr tablet; Take 1 tablet (5 mg total) by mouth at bedtime.  Dispense: 90 tablet; Refill: 1  4. Bilateral impacted cerumen 11 impaction of cerumen and we will refill Debrox use twice a day and bulb syringe irrigation - carbamide peroxide (DEBROX) 6.5 % OTIC solution; Place 5 drops into both ears 2 (two) times daily.  Dispense: 15 mL; Refill: 0  5. Gastroesophageal reflux disease, unspecified whether esophagitis present .  Controlled.  Stable.  Continue pantoprazole 40 mg once a day. - pantoprazole (PROTONIX) 40 MG tablet; Take 1 tablet (40 mg total) by mouth 2 (two) times daily before a meal.  Dispense: 180 tablet; Refill: 1  6. Primary insomnia New onset.  Difficulty initiating sleep.  Unable to get the rest by using behavioral means.  We will initiate trazodone 50 mg 1/2 to 1 tablet nightly and refill accordingly. - traZODone (DESYREL) 50 MG tablet; Take 0.5-1 tablets (25-50 mg total) by mouth at bedtime.  Dispense: 30 tablet; Refill: 3

## 2020-09-25 LAB — RENAL FUNCTION PANEL
Albumin: 4.7 g/dL (ref 3.8–4.8)
BUN/Creatinine Ratio: 17 (ref 12–28)
BUN: 17 mg/dL (ref 8–27)
CO2: 23 mmol/L (ref 20–29)
Calcium: 10.1 mg/dL (ref 8.7–10.3)
Chloride: 101 mmol/L (ref 96–106)
Creatinine, Ser: 0.98 mg/dL (ref 0.57–1.00)
Glucose: 100 mg/dL — ABNORMAL HIGH (ref 65–99)
Phosphorus: 4.4 mg/dL — ABNORMAL HIGH (ref 3.0–4.3)
Potassium: 4.6 mmol/L (ref 3.5–5.2)
Sodium: 144 mmol/L (ref 134–144)
eGFR: 66 mL/min/{1.73_m2} (ref >59)

## 2020-09-25 LAB — LIPID PANEL WITH LDL/HDL RATIO
Cholesterol, Total: 220 mg/dL — ABNORMAL HIGH (ref 100–199)
HDL: 76 mg/dL (ref >39)
LDL Chol Calc (NIH): 131 mg/dL — ABNORMAL HIGH (ref 0–99)
LDL/HDL Ratio: 1.7 ratio (ref 0.0–3.2)
Triglycerides: 76 mg/dL (ref 0–149)
VLDL Cholesterol Cal: 13 mg/dL (ref 5–40)

## 2021-02-17 ENCOUNTER — Encounter: Payer: Self-pay | Admitting: Family Medicine

## 2021-02-17 ENCOUNTER — Ambulatory Visit: Payer: BC Managed Care – PPO | Admitting: Family Medicine

## 2021-02-17 ENCOUNTER — Other Ambulatory Visit: Payer: Self-pay

## 2021-02-17 VITALS — BP 110/80 | HR 76 | Ht 61.0 in | Wt 120.0 lb

## 2021-02-17 DIAGNOSIS — R634 Abnormal weight loss: Secondary | ICD-10-CM

## 2021-02-17 NOTE — Progress Notes (Signed)
Date:  02/17/2021   Name:  Samantha Irwin   DOB:  10/16/1960   MRN:  [id number]   Chief Complaint: weight issue (Nocturia and weight has pretty much stayed the same?)  Patient is a 62 year old female who presents for a concern for weight loss exam. The patient reports the following problems: weight loss/1 lb by our scales. Health maintenance has been reviewed up to date.     Lab Results  Component Value Date   NA 144 09/24/2020   K 4.6 09/24/2020   CO2 23 09/24/2020   GLUCOSE 100 (H) 09/24/2020   BUN 17 09/24/2020   CREATININE 0.98 09/24/2020   CALCIUM 10.1 09/24/2020   EGFR 66 09/24/2020   GFRNONAA >60 06/08/2020   Lab Results  Component Value Date   CHOL 220 (H) 09/24/2020   HDL 76 09/24/2020   LDLCALC 131 (H) 09/24/2020   TRIG 76 09/24/2020   CHOLHDL 3.2 05/30/2018   No results found for: TSH Lab Results  Component Value Date   HGBA1C 5.2 09/13/2016   Lab Results  Component Value Date   WBC 5.3 06/19/2020   HGB 11.9 06/19/2020   HCT 35.1 06/19/2020   MCV 86 06/19/2020   PLT 428 06/19/2020   Lab Results  Component Value Date   ALT 17 06/19/2020   AST 12 06/19/2020   ALKPHOS 118 06/19/2020   BILITOT 0.3 06/19/2020   No results found for: 25OHVITD2, 25OHVITD3, VD25OH   Review of Systems  Constitutional:  Negative for chills and fever.  HENT:  Negative for drooling, ear discharge, ear pain and sore throat.   Respiratory:  Negative for cough, shortness of breath and wheezing.   Cardiovascular:  Negative for chest pain, palpitations and leg swelling.  Gastrointestinal:  Negative for abdominal pain, blood in stool, constipation, diarrhea and nausea.  Endocrine: Negative for polydipsia.  Genitourinary:  Negative for dysuria, frequency, hematuria and urgency.  Musculoskeletal:  Negative for back pain, myalgias and neck pain.  Skin:  Negative for rash.  Allergic/Immunologic: Negative for environmental allergies.  Neurological:  Negative for dizziness  and headaches.  Hematological:  Does not bruise/bleed easily.  Psychiatric/Behavioral:  Negative for suicidal ideas. The patient is not nervous/anxious.    Patient Active Problem List   Diagnosis Date Noted   Overactive bladder 05/30/2018   Chronic obstructive pulmonary disease ([place]) 05/30/2018   Familial hypercholesterolemia 05/30/2018   Flexural eczema 05/30/2018   Cigarette nicotine dependence without complication 47/65/4650   Atherosclerosis of aorta ([place]) 05/12/2017   Essential hypertension 06/12/2015    No Known Allergies  Past Surgical History:  Procedure Laterality Date   CESAREAN SECTION     COLONOSCOPY  2012   cleared for 5 yrs- [place]   COLONOSCOPY WITH PROPOFOL N/A 11/02/2016   Procedure: COLONOSCOPY WITH PROPOFOL;  Surgeon: Lin Landsman, MD;  Location: [place];  Service: Gastroenterology;  Laterality: N/A;   ERCP N/A 06/05/2020   Procedure: ENDOSCOPIC RETROGRADE CHOLANGIOPANCREATOGRAPHY (ERCP);  Surgeon: Lucilla Lame, MD;  Location: [facility]COPY;  Service: Endoscopy;  Laterality: N/A;   ESOPHAGOGASTRODUODENOSCOPY (EGD) WITH PROPOFOL N/A 06/05/2020   Procedure: ESOPHAGOGASTRODUODENOSCOPY (EGD) WITH PROPOFOL;  Surgeon: Lucilla Lame, MD;  Location: [facility] ENDOSCOPY;  Service: Endoscopy;  Laterality: N/A;    Social History   Tobacco Use   Smoking status: Former    Packs/day: 0.00    Types: Cigarettes    Quit date: 06/09/2020    Years since quitting: 0.6   Smokeless tobacco: Never  Tobacco comments:    gave info on patches and pills  Vaping Use   Vaping Use: Never used  Substance Use Topics   Alcohol use: Not Currently    Alcohol/week: 3.0 standard drinks    Types: 3 Glasses of wine per week   Drug use: No     Medication list has been reviewed and updated.  Current Meds  Medication Sig   albuterol (VENTOLIN HFA) 108 (90 Base) MCG/ACT inhaler Inhale 2 puffs into the lungs every 6 (six) hours as needed for wheezing or shortness of breath.    atorvastatin (LIPITOR) 10 MG tablet Take 1 tablet (10 mg total) by mouth daily.   B Complex Vitamins (B COMPLEX PO) Take by mouth daily.   Biotin 10 MG TABS Take 1 tablet by mouth daily.   carbamide peroxide (DEBROX) 6.5 % OTIC solution Place 5 drops into both ears 2 (two) times daily.   cholecalciferol (VITAMIN D) 1000 UNITS tablet Take 1,000 Units by mouth daily.   fluticasone furoate-vilanterol (BREO ELLIPTA) 100-25 MCG/INH AEPB Inhale 1 puff into the lungs in the morning.   hydrochlorothiazide (HYDRODIURIL) 12.5 MG tablet Take 1 tablet (12.5 mg total) by mouth daily.   metoprolol succinate (TOPROL-XL) 25 MG 24 hr tablet Take 1 tablet (25 mg total) by mouth daily.   Multiple Vitamins-Minerals (MULTIVITAMIN WOMEN 50+ PO) Take 1 tablet by mouth daily.   Omega-3 Fatty Acids (FISH OIL) 1000 MG CAPS Take 1 capsule (1,000 mg total) by mouth daily.   oxybutynin (DITROPAN-XL) 5 MG 24 hr tablet Take 1 tablet (5 mg total) by mouth at bedtime.   pantoprazole (PROTONIX) 40 MG tablet Take 1 tablet (40 mg total) by mouth 2 (two) times daily before a meal.   thiamine 100 MG tablet Take 1 tablet (100 mg total) by mouth daily.   traZODone (DESYREL) 50 MG tablet Take 0.5-1 tablets (25-50 mg total) by mouth at bedtime.   vitamin B-12 (CYANOCOBALAMIN) 1000 MCG tablet Take 1 tablet (1,000 mcg total) by mouth daily.   vitamin C (ASCORBIC ACID) 250 MG tablet Take 250 mg by mouth daily.    PHQ 2/9 Scores 02/17/2021 09/24/2020 06/19/2020 03/19/2020  PHQ - 2 Score 0 0 0 0  PHQ- 9 Score 0 7 0 0    GAD 7 : Generalized Anxiety Score 02/17/2021 09/24/2020 06/19/2020 03/19/2020  Nervous, Anxious, on Edge 0 1 0 0  Control/stop worrying 0 2 0 0  Worry too much - different things 0 2 0 0  Trouble relaxing 0 2 0 0  Restless 0 0 0 0  Easily annoyed or irritable 0 0 0 0  Afraid - awful might happen 0 0 0 0  Total GAD 7 Score 0 7 0 0  Anxiety Difficulty Not difficult at all Not difficult at all - -    BP Readings from Last 3  Encounters:  02/17/21 110/80  09/24/20 120/70  07/21/20 131/60    Physical Exam Vitals and nursing note reviewed.  Constitutional:      Appearance: She is well-developed.  HENT:     Head: Normocephalic.     Right Ear: Tympanic membrane and external ear normal.     Left Ear: Tympanic membrane and external ear normal.     Nose: Nose normal. No congestion or rhinorrhea.  Eyes:     General: Lids are everted, no foreign bodies appreciated. No scleral icterus.       Left eye: No foreign body or hordeolum.  Conjunctiva/sclera: Conjunctivae normal.     Right eye: Right conjunctiva is not injected.     Left eye: Left conjunctiva is not injected.     Pupils: Pupils are equal, round, and reactive to light.  Neck:     Thyroid: No thyromegaly.     Vascular: No JVD.     Trachea: No tracheal deviation.  Cardiovascular:     Rate and Rhythm: Normal rate and regular rhythm.     Heart sounds: Normal heart sounds. No murmur heard.   No friction rub. No gallop.  Pulmonary:     Effort: Pulmonary effort is normal. No respiratory distress.     Breath sounds: Normal breath sounds. No wheezing, rhonchi or rales.  Abdominal:     General: Bowel sounds are normal.     Palpations: Abdomen is soft. There is no mass.     Tenderness: There is no abdominal tenderness. There is no guarding or rebound.  Musculoskeletal:        General: No tenderness. Normal range of motion.     Cervical back: Normal range of motion and neck supple.  Lymphadenopathy:     Cervical: No cervical adenopathy.  Skin:    General: Skin is warm.     Findings: No rash.  Neurological:     Mental Status: She is alert and oriented to person, place, and time.     Cranial Nerves: No cranial nerve deficit.     Deep Tendon Reflexes: Reflexes normal.  Psychiatric:        Mood and Affect: Mood is not anxious or depressed.    Wt Readings from Last 3 Encounters:  02/17/21 120 lb (54.4 kg)  09/24/20 119 lb (54 kg)  07/21/20 121 lb  14.6 oz (55.3 kg)    BP 110/80    Pulse 76    Ht $R'5\' 1"'QN$  (1.549 m)    Wt 120 lb (54.4 kg)    BMI 22.67 kg/m   Assessment and Plan:  1. Weight loss Since patient has had her gallbladder surgery she has complained about her close not fitting her and attributes this to weight loss.  Our scales do not really resist Register more than 1 pound weight loss however she is insistent that there is a sizable weight loss that needs to be addressed.  Review of circumstances does not reveal any areas of further concern and that she has colonoscopy on a regular basis a recent upper endoscopy, mammogram surveillance, and lab work surveillance.  We will begin a work-up for weight loss including thyroid/hepatic/glucose/and given the history of smoking inquired the possibility of spiral CT surveillance of the lungs. - Comprehensive metabolic panel - CBC with Differential/Platelet - Thyroid Panel With TSH

## 2021-02-18 LAB — COMPREHENSIVE METABOLIC PANEL
ALT: 20 IU/L (ref 0–32)
AST: 25 IU/L (ref 0–40)
Albumin/Globulin Ratio: 1.6 (ref 1.2–2.2)
Albumin: 4.4 g/dL (ref 3.8–4.8)
Alkaline Phosphatase: 91 IU/L (ref 44–121)
BUN/Creatinine Ratio: 19 (ref 12–28)
BUN: 18 mg/dL (ref 8–27)
Bilirubin Total: 0.3 mg/dL (ref 0.0–1.2)
CO2: 22 mmol/L (ref 20–29)
Calcium: 9.3 mg/dL (ref 8.7–10.3)
Chloride: 98 mmol/L (ref 96–106)
Creatinine, Ser: 0.95 mg/dL (ref 0.57–1.00)
Globulin, Total: 2.8 g/dL (ref 1.5–4.5)
Glucose: 101 mg/dL — ABNORMAL HIGH (ref 70–99)
Potassium: 5.8 mmol/L — ABNORMAL HIGH (ref 3.5–5.2)
Sodium: 137 mmol/L (ref 134–144)
Total Protein: 7.2 g/dL (ref 6.0–8.5)
eGFR: 68 mL/min/{1.73_m2} (ref >59)

## 2021-02-18 LAB — CBC WITH DIFFERENTIAL/PLATELET
Basophils Absolute: 0 10*3/uL (ref 0.0–0.2)
Basos: 1 %
EOS (ABSOLUTE): 0.3 10*3/uL (ref 0.0–0.4)
Eos: 6 %
Hematocrit: 33.9 % — ABNORMAL LOW (ref 34.0–46.6)
Hemoglobin: 11.7 g/dL (ref 11.1–15.9)
Immature Grans (Abs): 0 10*3/uL (ref 0.0–0.1)
Immature Granulocytes: 0 %
Lymphocytes Absolute: 1.1 10*3/uL (ref 0.7–3.1)
Lymphs: 24 %
MCH: 29.8 pg (ref 26.6–33.0)
MCHC: 34.5 g/dL (ref 31.5–35.7)
MCV: 86 fL (ref 79–97)
Monocytes Absolute: 0.5 10*3/uL (ref 0.1–0.9)
Monocytes: 11 %
Neutrophils Absolute: 2.6 10*3/uL (ref 1.4–7.0)
Neutrophils: 58 %
Platelets: 250 10*3/uL (ref 150–450)
RBC: 3.93 x10E6/uL (ref 3.77–5.28)
RDW: 11.1 % — ABNORMAL LOW (ref 11.7–15.4)
WBC: 4.4 10*3/uL (ref 3.4–10.8)

## 2021-02-18 LAB — THYROID PANEL WITH TSH
Free Thyroxine Index: 2.6 (ref 1.2–4.9)
T3 Uptake Ratio: 32 % (ref 24–39)
T4, Total: 8.2 ug/dL (ref 4.5–12.0)
TSH: 0.868 u[IU]/mL (ref 0.450–4.500)

## 2021-02-23 ENCOUNTER — Telehealth: Payer: Self-pay | Admitting: Family Medicine

## 2021-02-23 NOTE — Telephone Encounter (Signed)
Called pt with acceptable labs. Pt verbalized understanding.  KP

## 2021-02-23 NOTE — Telephone Encounter (Signed)
Copied from CRM [id number]. Topic: General - Other >> Feb 23, 2021  9:47 AM Darrick Penna, Hospital doctor R wrote: Reason for CRM: pt was inquiring about her lab results from her appt on 01/10 requesting a call back t discuss

## 2021-03-27 ENCOUNTER — Ambulatory Visit: Payer: BC Managed Care – PPO | Admitting: Family Medicine

## 2021-04-02 ENCOUNTER — Ambulatory Visit: Payer: BC Managed Care – PPO | Admitting: Family Medicine

## 2021-04-02 ENCOUNTER — Other Ambulatory Visit: Payer: Self-pay

## 2021-04-02 ENCOUNTER — Encounter: Payer: Self-pay | Admitting: Family Medicine

## 2021-04-02 VITALS — BP 120/80 | HR 80 | Ht 61.0 in | Wt 120.0 lb

## 2021-04-02 DIAGNOSIS — F5101 Primary insomnia: Secondary | ICD-10-CM | POA: Diagnosis not present

## 2021-04-02 DIAGNOSIS — K219 Gastro-esophageal reflux disease without esophagitis: Secondary | ICD-10-CM | POA: Diagnosis not present

## 2021-04-02 DIAGNOSIS — I10 Essential (primary) hypertension: Secondary | ICD-10-CM | POA: Diagnosis not present

## 2021-04-02 DIAGNOSIS — E7801 Familial hypercholesterolemia: Secondary | ICD-10-CM

## 2021-04-02 DIAGNOSIS — H6123 Impacted cerumen, bilateral: Secondary | ICD-10-CM

## 2021-04-02 MED ORDER — PANTOPRAZOLE SODIUM 40 MG PO TBEC: 40.0000 mg | DELAYED_RELEASE_TABLET | Freq: Two times a day (BID) | ORAL | 1 refills | Status: DC

## 2021-04-02 MED ORDER — ATORVASTATIN CALCIUM 10 MG PO TABS: 10.0000 mg | ORAL_TABLET | Freq: Every day | ORAL | 1 refills | Status: DC

## 2021-04-02 MED ORDER — TRAZODONE HCL 50 MG PO TABS: 25.0000 mg | ORAL_TABLET | Freq: Every day | ORAL | 11 refills | Status: DC

## 2021-04-02 MED ORDER — HYDROCHLOROTHIAZIDE 12.5 MG PO TABS: 12.5000 mg | ORAL_TABLET | Freq: Every day | ORAL | 1 refills | Status: DC

## 2021-04-02 MED ORDER — METOPROLOL SUCCINATE ER 25 MG PO TB24: 25.0000 mg | ORAL_TABLET | Freq: Every day | ORAL | 1 refills | Status: DC

## 2021-04-02 NOTE — Progress Notes (Signed)
Date:  04/02/2021   Name:  Samantha Irwin   DOB:  1960-10-10   MRN:  [id number]   Chief Complaint: Hypertension, Hyperlipidemia, Gastroesophageal Reflux, Insomnia, and overactive bladder  Hypertension This is a chronic problem. The current episode started more than 1 year ago. The problem has been waxing and waning since onset. The problem is controlled. Pertinent negatives include no anxiety, blurred vision, chest pain, headaches, malaise/fatigue, neck pain, orthopnea, palpitations, peripheral edema, PND, shortness of breath or sweats. There are no associated agents to hypertension. There are no known risk factors for coronary artery disease. Past treatments include beta blockers and diuretics. The current treatment provides moderate improvement. There are no compliance problems.  There is no history of angina, kidney disease, CAD/MI, CVA, heart failure, left ventricular hypertrophy, PVD or retinopathy. There is no history of chronic renal disease, a hypertension causing med or renovascular disease.  Hyperlipidemia This is a chronic problem. The current episode started more than 1 year ago. The problem is controlled. Recent lipid tests were reviewed and are normal. She has no history of chronic renal disease. Factors aggravating her hyperlipidemia include thiazides. Pertinent negatives include no chest pain, focal sensory loss, focal weakness, leg pain, myalgias or shortness of breath. Current antihyperlipidemic treatment includes statins. The current treatment provides moderate improvement of lipids. There are no compliance problems.   Gastroesophageal Reflux She reports no abdominal pain, no belching, no chest pain, no choking, no coughing, no dysphagia, no early satiety, no globus sensation, no heartburn, no nausea, no sore throat, no stridor or no wheezing. This is a chronic problem. The current episode started more than 1 year ago. The problem has been gradually improving. The symptoms  are aggravated by certain foods. She has tried a PPI for the symptoms. The treatment provided moderate relief.  Insomnia Primary symptoms: no fragmented sleep, no sleep disturbance, no somnolence, no frequent awakening, no malaise/fatigue.   The current episode started more than one month.   Lab Results  Component Value Date   NA 137 02/17/2021   K 5.8 (H) 02/17/2021   CO2 22 02/17/2021   GLUCOSE 101 (H) 02/17/2021   BUN 18 02/17/2021   CREATININE 0.95 02/17/2021   CALCIUM 9.3 02/17/2021   EGFR 68 02/17/2021   GFRNONAA >60 06/08/2020   Lab Results  Component Value Date   CHOL 220 (H) 09/24/2020   HDL 76 09/24/2020   LDLCALC 131 (H) 09/24/2020   TRIG 76 09/24/2020   CHOLHDL 3.2 05/30/2018   Lab Results  Component Value Date   TSH 0.868 02/17/2021   Lab Results  Component Value Date   HGBA1C 5.2 09/13/2016   Lab Results  Component Value Date   WBC 4.4 02/17/2021   HGB 11.7 02/17/2021   HCT 33.9 (L) 02/17/2021   MCV 86 02/17/2021   PLT 250 02/17/2021   Lab Results  Component Value Date   ALT 20 02/17/2021   AST 25 02/17/2021   ALKPHOS 91 02/17/2021   BILITOT 0.3 02/17/2021   No results found for: 25OHVITD2, 25OHVITD3, VD25OH   Review of Systems  Constitutional:  Negative for chills, fever and malaise/fatigue.  HENT:  Negative for drooling, ear discharge, ear pain and sore throat.   Eyes:  Negative for blurred vision.  Respiratory:  Negative for cough, choking, shortness of breath and wheezing.   Cardiovascular:  Negative for chest pain, palpitations, orthopnea, leg swelling and PND.  Gastrointestinal:  Negative for abdominal pain, blood in stool, constipation, diarrhea, dysphagia,  heartburn and nausea.  Endocrine: Negative for polydipsia.  Genitourinary:  Negative for dysuria, frequency, hematuria and urgency.  Musculoskeletal:  Negative for back pain, myalgias and neck pain.  Skin:  Negative for rash.  Allergic/Immunologic: Negative for environmental  allergies.  Neurological:  Negative for dizziness, focal weakness and headaches.  Hematological:  Does not bruise/bleed easily.  Psychiatric/Behavioral:  Negative for sleep disturbance and suicidal ideas. The patient has insomnia. The patient is not nervous/anxious.    Patient Active Problem List   Diagnosis Date Noted   Overactive bladder 05/30/2018   Chronic obstructive pulmonary disease ([place]) 05/30/2018   Familial hypercholesterolemia 05/30/2018   Flexural eczema 05/30/2018   Cigarette nicotine dependence without complication 49/67/5916   Atherosclerosis of aorta ([place]) 05/12/2017   Essential hypertension 06/12/2015    No Known Allergies  Past Surgical History:  Procedure Laterality Date   CESAREAN SECTION     COLONOSCOPY  2012   cleared for 5 yrs- [facility]   COLONOSCOPY WITH PROPOFOL N/A 11/02/2016   Procedure: COLONOSCOPY WITH PROPOFOL;  Surgeon: Lin Landsman, MD;  Location: [facility];  Service: Gastroenterology;  Laterality: N/A;   ERCP N/A 06/05/2020   Procedure: ENDOSCOPIC RETROGRADE CHOLANGIOPANCREATOGRAPHY (ERCP);  Surgeon: Lucilla Lame, MD;  Location: [facility]COPY;  Service: Endoscopy;  Laterality: N/A;   ESOPHAGOGASTRODUODENOSCOPY (EGD) WITH PROPOFOL N/A 06/05/2020   Procedure: ESOPHAGOGASTRODUODENOSCOPY (EGD) WITH PROPOFOL;  Surgeon: Lucilla Lame, MD;  Location: [facility] ENDOSCOPY;  Service: Endoscopy;  Laterality: N/A;    Social History   Tobacco Use   Smoking status: Former    Packs/day: 0.00    Types: Cigarettes    Quit date: 06/09/2020    Years since quitting: 0.8   Smokeless tobacco: Never   Tobacco comments:    gave info on patches and pills  Vaping Use   Vaping Use: Never used  Substance Use Topics   Alcohol use: Not Currently    Alcohol/week: 3.0 standard drinks    Types: 3 Glasses of wine per week   Drug use: No     Medication list has been reviewed and updated.  Current Meds  Medication Sig   albuterol (VENTOLIN HFA) 108 (90 Base)  MCG/ACT inhaler Inhale 2 puffs into the lungs every 6 (six) hours as needed for wheezing or shortness of breath.   atorvastatin (LIPITOR) 10 MG tablet Take 1 tablet (10 mg total) by mouth daily.   B Complex Vitamins (B COMPLEX PO) Take by mouth daily.   Biotin 10 MG TABS Take 1 tablet by mouth daily.   carbamide peroxide (DEBROX) 6.5 % OTIC solution Place 5 drops into both ears 2 (two) times daily.   cholecalciferol (VITAMIN D) 1000 UNITS tablet Take 1,000 Units by mouth daily.   hydrochlorothiazide (HYDRODIURIL) 12.5 MG tablet Take 1 tablet (12.5 mg total) by mouth daily.   metoprolol succinate (TOPROL-XL) 25 MG 24 hr tablet Take 1 tablet (25 mg total) by mouth daily.   Multiple Vitamins-Minerals (MULTIVITAMIN WOMEN 50+ PO) Take 1 tablet by mouth daily.   Omega-3 Fatty Acids (FISH OIL) 1000 MG CAPS Take 1 capsule (1,000 mg total) by mouth daily.   oxybutynin (DITROPAN-XL) 5 MG 24 hr tablet Take 1 tablet (5 mg total) by mouth at bedtime.   pantoprazole (PROTONIX) 40 MG tablet Take 1 tablet (40 mg total) by mouth 2 (two) times daily before a meal.   thiamine 100 MG tablet Take 1 tablet (100 mg total) by mouth daily.   traZODone (DESYREL) 50 MG tablet Take 0.5-1  tablets (25-50 mg total) by mouth at bedtime.   vitamin B-12 (CYANOCOBALAMIN) 1000 MCG tablet Take 1 tablet (1,000 mcg total) by mouth daily.   vitamin C (ASCORBIC ACID) 250 MG tablet Take 250 mg by mouth daily.    PHQ 2/9 Scores 02/17/2021 09/24/2020 06/19/2020 03/19/2020  PHQ - 2 Score 0 0 0 0  PHQ- 9 Score 0 7 0 0    GAD 7 : Generalized Anxiety Score 02/17/2021 09/24/2020 06/19/2020 03/19/2020  Nervous, Anxious, on Edge 0 1 0 0  Control/stop worrying 0 2 0 0  Worry too much - different things 0 2 0 0  Trouble relaxing 0 2 0 0  Restless 0 0 0 0  Easily annoyed or irritable 0 0 0 0  Afraid - awful might happen 0 0 0 0  Total GAD 7 Score 0 7 0 0  Anxiety Difficulty Not difficult at all Not difficult at all - -    BP Readings from Last  3 Encounters:  04/02/21 120/80  02/17/21 110/80  09/24/20 120/70    Physical Exam Vitals and nursing note reviewed.  Constitutional:      Appearance: She is well-developed.  HENT:     Head: Normocephalic.     Right Ear: Tympanic membrane, ear canal and external ear normal.     Left Ear: Tympanic membrane, ear canal and external ear normal.  Eyes:     General: Lids are everted, no foreign bodies appreciated. No scleral icterus.       Left eye: No foreign body or hordeolum.     Conjunctiva/sclera: Conjunctivae normal.     Right eye: Right conjunctiva is not injected.     Left eye: Left conjunctiva is not injected.     Pupils: Pupils are equal, round, and reactive to light.  Neck:     Thyroid: No thyromegaly.     Vascular: No JVD.     Trachea: No tracheal deviation.  Cardiovascular:     Rate and Rhythm: Normal rate and regular rhythm.     Heart sounds: Normal heart sounds. No murmur heard.   No friction rub. No gallop.  Pulmonary:     Effort: Pulmonary effort is normal. No respiratory distress.     Breath sounds: Normal breath sounds. No wheezing, rhonchi or rales.  Abdominal:     General: Bowel sounds are normal.     Palpations: Abdomen is soft. There is no mass.     Tenderness: There is no abdominal tenderness. There is no guarding or rebound.  Musculoskeletal:        General: No tenderness. Normal range of motion.     Cervical back: Normal range of motion and neck supple.  Lymphadenopathy:     Cervical: No cervical adenopathy.  Skin:    General: Skin is warm.     Findings: No rash.  Neurological:     Mental Status: She is alert and oriented to person, place, and time.     Cranial Nerves: No cranial nerve deficit.     Deep Tendon Reflexes: Reflexes normal.  Psychiatric:        Mood and Affect: Mood is not anxious or depressed.    Wt Readings from Last 3 Encounters:  04/02/21 120 lb (54.4 kg)  02/17/21 120 lb (54.4 kg)  09/24/20 119 lb (54 kg)    BP 120/80     Pulse 80    Ht $R'5\' 1"'nw$  (1.549 m)    Wt 120 lb (54.4 kg)  BMI 22.67 kg/m   Assessment and Plan:  1. Familial hypercholesterolemia Chronic.  Controlled.  Stable.  Continue atorvastatin 10 mg once a day. - atorvastatin (LIPITOR) 10 MG tablet; Take 1 tablet (10 mg total) by mouth daily.  Dispense: 90 tablet; Refill: 1  2. Essential hypertension Chronic.  Controlled.  Stable.  Blood pressure today is 120/80.  Continue hydrochlorothiazide 12.5 mg and metoprolol XL 25 mg once a day.  Review of previous labs last month are acceptable. - hydrochlorothiazide (HYDRODIURIL) 12.5 MG tablet; Take 1 tablet (12.5 mg total) by mouth daily.  Dispense: 90 tablet; Refill: 1 - metoprolol succinate (TOPROL-XL) 25 MG 24 hr tablet; Take 1 tablet (25 mg total) by mouth daily.  Dispense: 90 tablet; Refill: 1  3. Gastroesophageal reflux disease, unspecified whether esophagitis present Chronic.  Controlled.  Stable.  Continue pantoprazole 40 mg once a day. - pantoprazole (PROTONIX) 40 MG tablet; Take 1 tablet (40 mg total) by mouth 2 (two) times daily before a meal.  Dispense: 180 tablet; Refill: 1  4. Primary insomnia Chronic.  Controlled.  Stable.  Continue trazodone 50 mg 1/2 to 1 tablet nightly. - traZODone (DESYREL) 50 MG tablet; Take 0.5-1 tablets (25-50 mg total) by mouth at bedtime.  Dispense: 30 tablet; Refill: 11   5.  Bilateral cerumen impaction.  Patient has hand decreased hearing and sensation of pruritus in both ears.  On evaluation both ears are totally occluded with cerumen.  After loosening with water a curette was used to remove a significant cerumen plug bilateral.  Both tympanic membranes were intact and normal and patient is once again encouraged to use her Debrox and irrigation at least once a week.

## 2021-09-23 ENCOUNTER — Telehealth: Payer: Self-pay | Admitting: Family Medicine

## 2021-09-23 NOTE — Telephone Encounter (Signed)
Copied from CRM [id number]. Topic: General - Inquiry >> Sep 23, 2021  4:04 PM De Blanch wrote: Reason for CRM: Pt stated she is unsure if she is due for a mammogram or not. Pt stated if she is due, please send referral so she can schedule.

## 2021-09-23 NOTE — Telephone Encounter (Signed)
Called pt schedule CPE for 09/24/21. Mammogram will be ordered at the appointment.  KP

## 2021-09-24 ENCOUNTER — Telehealth: Payer: Self-pay

## 2021-09-24 ENCOUNTER — Ambulatory Visit (INDEPENDENT_AMBULATORY_CARE_PROVIDER_SITE_OTHER): Payer: BC Managed Care – PPO | Admitting: Family Medicine

## 2021-09-24 ENCOUNTER — Encounter: Payer: Self-pay | Admitting: Family Medicine

## 2021-09-24 VITALS — BP 132/80 | HR 68 | Ht 61.0 in | Wt 123.0 lb

## 2021-09-24 DIAGNOSIS — N3281 Overactive bladder: Secondary | ICD-10-CM | POA: Diagnosis not present

## 2021-09-24 DIAGNOSIS — E7801 Familial hypercholesterolemia: Secondary | ICD-10-CM

## 2021-09-24 DIAGNOSIS — Z0000 Encounter for general adult medical examination without abnormal findings: Secondary | ICD-10-CM

## 2021-09-24 DIAGNOSIS — F5101 Primary insomnia: Secondary | ICD-10-CM

## 2021-09-24 DIAGNOSIS — Z1231 Encounter for screening mammogram for malignant neoplasm of breast: Secondary | ICD-10-CM

## 2021-09-24 MED ORDER — TRAZODONE HCL 50 MG PO TABS: 25.0000 mg | ORAL_TABLET | Freq: Every day | ORAL | 11 refills | Status: DC

## 2021-09-24 NOTE — Progress Notes (Signed)
Date:  09/24/2021   Name:  Samantha Irwin   DOB:  October 14, 1960   MRN:  [id number]   Chief Complaint: Annual Exam (No pap)  Patient is a 61 year old female who presents for a comprehensive physical exam. The patient reports the following problems: none. Health maintenance has been reviewed up to date.      Lab Results  Component Value Date   NA 137 02/17/2021   K 5.8 (H) 02/17/2021   CO2 22 02/17/2021   GLUCOSE 101 (H) 02/17/2021   BUN 18 02/17/2021   CREATININE 0.95 02/17/2021   CALCIUM 9.3 02/17/2021   EGFR 68 02/17/2021   GFRNONAA >60 06/08/2020   Lab Results  Component Value Date   CHOL 220 (H) 09/24/2020   HDL 76 09/24/2020   LDLCALC 131 (H) 09/24/2020   TRIG 76 09/24/2020   CHOLHDL 3.2 05/30/2018   Lab Results  Component Value Date   TSH 0.868 02/17/2021   Lab Results  Component Value Date   HGBA1C 5.2 09/13/2016   Lab Results  Component Value Date   WBC 4.4 02/17/2021   HGB 11.7 02/17/2021   HCT 33.9 (L) 02/17/2021   MCV 86 02/17/2021   PLT 250 02/17/2021   Lab Results  Component Value Date   ALT 20 02/17/2021   AST 25 02/17/2021   ALKPHOS 91 02/17/2021   BILITOT 0.3 02/17/2021   No results found for: "25OHVITD2", "25OHVITD3", "VD25OH"   Review of Systems  Constitutional:  Negative for chills and fever.  HENT:  Negative for drooling, ear discharge, ear pain and sore throat.   Respiratory:  Negative for cough, shortness of breath and wheezing.   Cardiovascular:  Negative for chest pain, palpitations and leg swelling.  Gastrointestinal:  Negative for abdominal pain, blood in stool, constipation, diarrhea and nausea.  Endocrine: Negative for polydipsia.  Genitourinary:  Negative for dysuria, frequency, hematuria and urgency.  Musculoskeletal:  Negative for back pain, myalgias and neck pain.  Skin:  Negative for rash.  Allergic/Immunologic: Negative for environmental allergies.  Neurological:  Negative for dizziness and headaches.   Hematological:  Does not bruise/bleed easily.  Psychiatric/Behavioral:  Negative for suicidal ideas. The patient is not nervous/anxious.     Patient Active Problem List   Diagnosis Date Noted   Overactive bladder 05/30/2018   Chronic obstructive pulmonary disease ([facility]) 05/30/2018   Familial hypercholesterolemia 05/30/2018   Flexural eczema 05/30/2018   Cigarette nicotine dependence without complication 05/30/2018   Atherosclerosis of aorta ([facility]) 05/12/2017   Essential hypertension 06/12/2015    No Known Allergies  Past Surgical History:  Procedure Laterality Date   CESAREAN SECTION     COLONOSCOPY  2012   cleared for 5 yrs- [facility]   COLONOSCOPY WITH PROPOFOL N/A 11/02/2016   Procedure: COLONOSCOPY WITH PROPOFOL;  Surgeon: Toney Reil, MD;  Location: [facility];  Service: Gastroenterology;  Laterality: N/A;   ERCP N/A 06/05/2020   Procedure: ENDOSCOPIC RETROGRADE CHOLANGIOPANCREATOGRAPHY (ERCP);  Surgeon: Midge Minium, MD;  Location: [facility] END[facility]COPY;  Service: Endoscopy;  Laterality: N/A;   ESOPHAGOGASTRODUODENOSCOPY (EGD) WITH PROPOFOL N/A 06/05/2020   Procedure: ESOPHAGOGASTRODUODENOSCOPY (EGD) WITH PROPOFOL;  Surgeon: Midge Minium, MD;  Location: [facility] ENDOSCOPY;  Service: Endoscopy;  Laterality: N/A;    Social History   Tobacco Use   Smoking status: Former    Packs/day: 0.00    Types: Cigarettes    Quit date: 06/09/2020    Years since quitting: 1.2   Smokeless tobacco: Never   Tobacco  comments:    gave info on patches and pills  Vaping Use   Vaping Use: Never used  Substance Use Topics   Alcohol use: Not Currently    Alcohol/week: 3.0 standard drinks of alcohol    Types: 3 Glasses of wine per week   Drug use: No     Medication list has been reviewed and updated.  Current Meds  Medication Sig   albuterol (VENTOLIN HFA) 108 (90 Base) MCG/ACT inhaler Inhale 2 puffs into the lungs every 6 (six) hours as needed for wheezing or shortness of breath.    atorvastatin (LIPITOR) 10 MG tablet Take 1 tablet (10 mg total) by mouth daily.   B Complex Vitamins (B COMPLEX PO) Take by mouth daily.   Biotin 10 MG TABS Take 1 tablet by mouth daily.   carbamide peroxide (DEBROX) 6.5 % OTIC solution Place 5 drops into both ears 2 (two) times daily.   cholecalciferol (VITAMIN D) 1000 UNITS tablet Take 1,000 Units by mouth daily.   hydrochlorothiazide (HYDRODIURIL) 12.5 MG tablet Take 1 tablet (12.5 mg total) by mouth daily.   metoprolol succinate (TOPROL-XL) 25 MG 24 hr tablet Take 1 tablet (25 mg total) by mouth daily.   Multiple Vitamins-Minerals (MULTIVITAMIN WOMEN 50+ PO) Take 1 tablet by mouth daily.   Omega-3 Fatty Acids (FISH OIL) 1000 MG CAPS Take 1 capsule (1,000 mg total) by mouth daily.   oxybutynin (DITROPAN-XL) 5 MG 24 hr tablet Take 1 tablet (5 mg total) by mouth at bedtime.   pantoprazole (PROTONIX) 40 MG tablet Take 1 tablet (40 mg total) by mouth 2 (two) times daily before a meal.   thiamine 100 MG tablet Take 1 tablet (100 mg total) by mouth daily.   traZODone (DESYREL) 50 MG tablet Take 0.5-1 tablets (25-50 mg total) by mouth at bedtime.   triamcinolone ointment (KENALOG) 0.5 % Apply 1 application topically 2 (two) times daily.   vitamin B-12 (CYANOCOBALAMIN) 1000 MCG tablet Take 1 tablet (1,000 mcg total) by mouth daily.   vitamin C (ASCORBIC ACID) 250 MG tablet Take 250 mg by mouth daily.       09/24/2021   10:13 AM 02/17/2021    8:33 AM 09/24/2020    9:06 AM 06/19/2020    1:34 PM  GAD 7 : Generalized Anxiety Score  Nervous, Anxious, on Edge 0 0 1 0  Control/stop worrying 0 0 2 0  Worry too much - different things 1 0 2 0  Trouble relaxing 1 0 2 0  Restless 0 0 0 0  Easily annoyed or irritable 0 0 0 0  Afraid - awful might happen 0 0 0 0  Total GAD 7 Score 2 0 7 0  Anxiety Difficulty Not difficult at all Not difficult at all Not difficult at all        09/24/2021   10:13 AM 02/17/2021    8:32 AM 09/24/2020    9:04 AM   Depression screen PHQ 2/9  Decreased Interest 1 0 0  Down, Depressed, Hopeless 1 0 0  PHQ - 2 Score 2 0 0  Altered sleeping 2 0 3  Tired, decreased energy 1 0 1  Change in appetite 0 0 2  Feeling bad or failure about yourself  1 0 0  Trouble concentrating 1 0 0  Moving slowly or fidgety/restless 0 0 1  Suicidal thoughts 0 0 0  PHQ-9 Score 7 0 7  Difficult doing work/chores Not difficult at all Not difficult at all Not  difficult at all    BP Readings from Last 3 Encounters:  09/24/21 132/80  04/02/21 120/80  02/17/21 110/80    Physical Exam Vitals and nursing note reviewed. Exam conducted with a chaperone present.  Constitutional:      General: She is not in acute distress.    Appearance: She is well-groomed and normal weight. She is not diaphoretic.  HENT:     Head: Normocephalic and atraumatic.     Right Ear: Hearing, tympanic membrane, ear canal and external ear normal.     Left Ear: Hearing, tympanic membrane, ear canal and external ear normal.     Nose: Nose normal.     Mouth/Throat:     Lips: Pink.     Mouth: Mucous membranes are moist.     Dentition: Normal dentition.     Tongue: No lesions.     Pharynx: Oropharynx is clear. Uvula midline. No pharyngeal swelling or posterior oropharyngeal erythema.  Eyes:     General: Lids are normal. Vision grossly intact. Gaze aligned appropriately.        Right eye: No discharge.        Left eye: No discharge.     Extraocular Movements: Extraocular movements intact.     Conjunctiva/sclera: Conjunctivae normal.     Pupils: Pupils are equal, round, and reactive to light.     Funduscopic exam:    Right eye: Red reflex present.        Left eye: Red reflex present. Neck:     Thyroid: No thyroid mass, thyromegaly or thyroid tenderness.     Vascular: Normal carotid pulses. No carotid bruit, hepatojugular reflux or JVD.     Trachea: Trachea and phonation normal.  Cardiovascular:     Rate and Rhythm: Normal rate and regular  rhythm.     Chest Wall: PMI is not displaced.     Pulses: Normal pulses.     Heart sounds: Normal heart sounds, S1 normal and S2 normal. No murmur heard.    No systolic murmur is present.     No diastolic murmur is present.     No friction rub. No gallop. No S3 or S4 sounds.  Pulmonary:     Effort: Pulmonary effort is normal.     Breath sounds: Normal breath sounds. No decreased breath sounds, wheezing, rhonchi or rales.  Chest:  Breasts:    Right: Normal. No swelling, bleeding, inverted nipple, mass, nipple discharge, skin change or tenderness.     Left: Normal. No swelling, bleeding, inverted nipple, mass, nipple discharge, skin change or tenderness.  Abdominal:     General: Bowel sounds are normal.     Palpations: Abdomen is soft. There is no hepatomegaly, splenomegaly or mass.     Tenderness: There is no abdominal tenderness. There is no guarding.     Hernia: No hernia is present. There is no hernia in the left inguinal area or right inguinal area.  Genitourinary:    Exam position: Knee-chest position.     Labia:        Right: No rash.        Left: No rash.   Musculoskeletal:        General: Normal range of motion.     Cervical back: Normal, normal range of motion and neck supple.     Thoracic back: Normal.     Lumbar back: Normal.     Right lower leg: No edema.     Left lower leg: No edema.  Lymphadenopathy:  Head:     Right side of head: No submental, submandibular or tonsillar adenopathy.     Left side of head: No submental, submandibular or tonsillar adenopathy.     Cervical: No cervical adenopathy.     Right cervical: No superficial, deep or posterior cervical adenopathy.    Left cervical: No superficial, deep or posterior cervical adenopathy.     Upper Body:     Right upper body: No supraclavicular or axillary adenopathy.     Left upper body: No supraclavicular or axillary adenopathy.     Lower Body: No right inguinal adenopathy. No left inguinal adenopathy.   Skin:    General: Skin is warm and dry.     Capillary Refill: Capillary refill takes less than 2 seconds.  Neurological:     Mental Status: She is alert.     Cranial Nerves: Cranial nerves 2-12 are intact.     Sensory: Sensation is intact.     Motor: Motor function is intact.     Deep Tendon Reflexes: Reflexes are normal and symmetric.  Psychiatric:        Behavior: Behavior is cooperative.     Wt Readings from Last 3 Encounters:  09/24/21 123 lb (55.8 kg)  04/02/21 120 lb (54.4 kg)  02/17/21 120 lb (54.4 kg)    BP 132/80   Pulse 68   Ht $R'5\' 1"'dx$  (1.549 m)   Wt 123 lb (55.8 kg)   BMI 23.24 kg/m   Assessment and Plan:  1. Annual physical exam Hlee Fringer is a 61 y.o. female who presents today for her Complete Annual Exam. She feels well. She reports exercising . She reports she is sleeping well.  Immunizations are reviewed and recommendations provided.   Age appropriate screening tests are discussed. Counseling given for risk factor reduction interventions.  No subjective/objective concerns noted at [facility] concerns, review of systems, review of past medical history and medications, and physical exam.  In addition to renal function panel we will also check lipid panel. - Renal Function Panel  2. Primary insomnia Chronic.  Controlled.  Stable.  Continue trazodone 50 mg nightly. - traZODone (DESYREL) 50 MG tablet; Take 0.5-1 tablets (25-50 mg total) by mouth at bedtime.  Dispense: 30 tablet; Refill: 11  3.  Overactive bladder Chronic.  Controlled.  Stable.  Referral to urology. - Ambulatory referral to Urology  4. Familial hypercholesterolemia Surveillance of LDL.  Discussed with patient and mammogram ordered. - Lipid Panel With LDL/HDL Ratio  5. Breast cancer screening by mammogram Discussed and mammogram ordered. - MM 3D SCREEN BREAST BILATERAL    Otilio Miu, MD

## 2021-09-24 NOTE — Telephone Encounter (Signed)
Called pt with mammo in [facility] Sept 14 @ 800

## 2021-09-25 LAB — LIPID PANEL WITH LDL/HDL RATIO
Cholesterol, Total: 217 mg/dL — ABNORMAL HIGH (ref 100–199)
HDL: 92 mg/dL (ref >39)
LDL Chol Calc (NIH): 107 mg/dL — ABNORMAL HIGH (ref 0–99)
LDL/HDL Ratio: 1.2 ratio (ref 0.0–3.2)
Triglycerides: 104 mg/dL (ref 0–149)
VLDL Cholesterol Cal: 18 mg/dL (ref 5–40)

## 2021-09-25 LAB — RENAL FUNCTION PANEL
Albumin: 4.5 g/dL (ref 3.9–4.9)
BUN/Creatinine Ratio: 18 (ref 12–28)
BUN: 19 mg/dL (ref 8–27)
CO2: 26 mmol/L (ref 20–29)
Calcium: 10.1 mg/dL (ref 8.7–10.3)
Chloride: 100 mmol/L (ref 96–106)
Creatinine, Ser: 1.06 mg/dL — ABNORMAL HIGH (ref 0.57–1.00)
Glucose: 103 mg/dL — ABNORMAL HIGH (ref 70–99)
Phosphorus: 4 mg/dL (ref 3.0–4.3)
Potassium: 4.3 mmol/L (ref 3.5–5.2)
Sodium: 138 mmol/L (ref 134–144)
eGFR: 59 mL/min/{1.73_m2} — ABNORMAL LOW (ref >59)

## 2021-10-01 ENCOUNTER — Ambulatory Visit: Payer: BC Managed Care – PPO | Admitting: Family Medicine

## 2021-10-05 ENCOUNTER — Ambulatory Visit (INDEPENDENT_AMBULATORY_CARE_PROVIDER_SITE_OTHER): Payer: BC Managed Care – PPO | Admitting: Family Medicine

## 2021-10-05 ENCOUNTER — Encounter: Payer: Self-pay | Admitting: Family Medicine

## 2021-10-05 VITALS — BP 138/88 | HR 80 | Ht 61.0 in | Wt 125.0 lb

## 2021-10-05 DIAGNOSIS — N3281 Overactive bladder: Secondary | ICD-10-CM | POA: Diagnosis not present

## 2021-10-05 DIAGNOSIS — F5101 Primary insomnia: Secondary | ICD-10-CM

## 2021-10-05 DIAGNOSIS — K219 Gastro-esophageal reflux disease without esophagitis: Secondary | ICD-10-CM | POA: Diagnosis not present

## 2021-10-05 DIAGNOSIS — I10 Essential (primary) hypertension: Secondary | ICD-10-CM | POA: Diagnosis not present

## 2021-10-05 DIAGNOSIS — E7801 Familial hypercholesterolemia: Secondary | ICD-10-CM | POA: Diagnosis not present

## 2021-10-05 MED ORDER — ATORVASTATIN CALCIUM 10 MG PO TABS: 10.0000 mg | ORAL_TABLET | Freq: Every day | ORAL | 1 refills | Status: DC

## 2021-10-05 MED ORDER — TRAZODONE HCL 50 MG PO TABS: 25.0000 mg | ORAL_TABLET | Freq: Every day | ORAL | 5 refills | Status: DC

## 2021-10-05 MED ORDER — OXYBUTYNIN CHLORIDE ER 5 MG PO TB24: 5.0000 mg | ORAL_TABLET | Freq: Every day | ORAL | 1 refills | Status: DC

## 2021-10-05 MED ORDER — METOPROLOL SUCCINATE ER 25 MG PO TB24: 25.0000 mg | ORAL_TABLET | Freq: Every day | ORAL | 1 refills | Status: DC

## 2021-10-05 MED ORDER — PANTOPRAZOLE SODIUM 40 MG PO TBEC: 40.0000 mg | DELAYED_RELEASE_TABLET | Freq: Two times a day (BID) | ORAL | 1 refills | Status: DC

## 2021-10-05 MED ORDER — HYDROCHLOROTHIAZIDE 12.5 MG PO TABS: 12.5000 mg | ORAL_TABLET | Freq: Every day | ORAL | 1 refills | Status: DC

## 2021-10-05 NOTE — Progress Notes (Signed)
Date:  10/05/2021   Name:  Samantha Irwin   DOB:  [birth year]   MRN:  [id number]   Chief Complaint: Insomnia, Gastroesophageal Reflux, Hypertension, Hyperlipidemia, and overactive bladder  Insomnia Primary symptoms: no fragmented sleep, no sleep disturbance, no difficulty falling asleep, no somnolence, no frequent awakening, no premature morning awakening, no malaise/fatigue, no napping.   The current episode started more than one year. The onset quality is gradual. The problem occurs intermittently. The problem has been gradually improving since onset.  Gastroesophageal Reflux She reports no abdominal pain, no belching, no chest pain, no choking, no coughing, no dysphagia, no early satiety, no heartburn, no hoarse voice, no nausea, no sore throat or no wheezing. This is a chronic problem. The problem has been gradually improving. The symptoms are aggravated by certain foods. Pertinent negatives include no anemia, fatigue, melena, muscle weakness, orthopnea or weight loss. She has tried a PPI for the symptoms. The treatment provided moderate relief.  Hypertension This is a chronic problem. The current episode started more than 1 year ago. The problem is controlled. Pertinent negatives include no blurred vision, chest pain, headaches, malaise/fatigue, neck pain, orthopnea, palpitations, peripheral edema, PND or shortness of breath. There are no associated agents to hypertension. Past treatments include diuretics and beta blockers. The current treatment provides moderate improvement. There are no compliance problems.  There is no history of angina, kidney disease, CAD/MI, CVA, heart failure, left ventricular hypertrophy, PVD or retinopathy. There is no history of chronic renal disease, a hypertension causing med or renovascular disease.  Hyperlipidemia This is a chronic problem. The current episode started more than 1 year ago. The problem is controlled. Recent lipid tests were reviewed and are  normal. She has no history of chronic renal disease or diabetes. Pertinent negatives include no chest pain, focal sensory loss, focal weakness, leg pain, myalgias or shortness of breath. Current antihyperlipidemic treatment includes statins. The current treatment provides moderate improvement of lipids.    Lab Results  Component Value Date   NA 138 09/24/2021   K 4.3 09/24/2021   CO2 26 09/24/2021   GLUCOSE 103 (H) 09/24/2021   BUN 19 09/24/2021   CREATININE 1.06 (H) 09/24/2021   CALCIUM 10.1 09/24/2021   EGFR 59 (L) 09/24/2021   GFRNONAA >60 06/08/2020   Lab Results  Component Value Date   CHOL 217 (H) 09/24/2021   HDL 92 09/24/2021   LDLCALC 107 (H) 09/24/2021   TRIG 104 09/24/2021   CHOLHDL 3.2 05/30/2018   Lab Results  Component Value Date   TSH 0.868 02/17/2021   Lab Results  Component Value Date   HGBA1C 5.2 09/13/2016   Lab Results  Component Value Date   WBC 4.4 02/17/2021   HGB 11.7 02/17/2021   HCT 33.9 (L) 02/17/2021   MCV 86 02/17/2021   PLT 250 02/17/2021   Lab Results  Component Value Date   ALT 20 02/17/2021   AST 25 02/17/2021   ALKPHOS 91 02/17/2021   BILITOT 0.3 02/17/2021   No results found for: "25OHVITD2", "25OHVITD3", "VD25OH"   Review of Systems  Constitutional:  Negative for chills, fatigue, fever, malaise/fatigue and weight loss.  HENT:  Negative for drooling, ear discharge, ear pain, hoarse voice and sore throat.   Eyes:  Negative for blurred vision.  Respiratory:  Negative for cough, choking, shortness of breath and wheezing.   Cardiovascular:  Negative for chest pain, palpitations, orthopnea, leg swelling and PND.  Gastrointestinal:  Negative for abdominal pain, blood  in stool, constipation, diarrhea, dysphagia, heartburn, melena and nausea.  Endocrine: Negative for polydipsia.  Genitourinary:  Negative for dysuria, frequency, hematuria and urgency.  Musculoskeletal:  Negative for back pain, myalgias, muscle weakness and neck pain.   Skin:  Negative for rash.  Allergic/Immunologic: Negative for environmental allergies.  Neurological:  Negative for dizziness, focal weakness and headaches.  Hematological:  Does not bruise/bleed easily.  Psychiatric/Behavioral:  Negative for sleep disturbance and suicidal ideas. The patient has insomnia. The patient is not nervous/anxious.     Patient Active Problem List   Diagnosis Date Noted   Overactive bladder 05/30/2018   Chronic obstructive pulmonary disease ([place]) 05/30/2018   Familial hypercholesterolemia 05/30/2018   Flexural eczema 05/30/2018   Cigarette nicotine dependence without complication 35/46/5681   Atherosclerosis of aorta ([place]) 05/12/2017   Essential hypertension 06/12/2015    No Known Allergies  Past Surgical History:  Procedure Laterality Date   CESAREAN SECTION     COLONOSCOPY  2012   cleared for 5 yrs- [place]   COLONOSCOPY WITH PROPOFOL N/A 11/02/2016   Procedure: COLONOSCOPY WITH PROPOFOL;  Surgeon: Lin Landsman, MD;  Location: [facility];  Service: Gastroenterology;  Laterality: N/A;   ERCP N/A 06/05/2020   Procedure: ENDOSCOPIC RETROGRADE CHOLANGIOPANCREATOGRAPHY (ERCP);  Surgeon: Lucilla Lame, MD;  Location[facility] ENDOSCOPY;  Service: Endoscopy;  Laterality: N/A;   ESOPHAGOGASTRODUODENOSCOPY (EGD) WITH PROPOFOL N/A 06/05/2020   Procedure: ESOPHAGOGASTRODUODENOSCOPY (EGD) WITH PROPOFOL;  Surgeon: Lucilla Lame, MD;  Location: [facility] ENDOSCOPY;  Service: Endoscopy;  Laterality: N/A;    Social History   Tobacco Use   Smoking status: Former    Packs/day: 0.00    Types: Cigarettes    Quit date: 06/09/2020    Years since quitting: 1.3   Smokeless tobacco: Never   Tobacco comments:    gave info on patches and pills  Vaping Use   Vaping Use: Never used  Substance Use Topics   Alcohol use: Not Currently    Alcohol/week: 3.0 standard drinks of alcohol    Types: 3 Glasses of wine per week   Drug use: No     Medication list has been  reviewed and updated.  Current Meds  Medication Sig   albuterol (VENTOLIN HFA) 108 (90 Base) MCG/ACT inhaler Inhale 2 puffs into the lungs every 6 (six) hours as needed for wheezing or shortness of breath.   atorvastatin (LIPITOR) 10 MG tablet Take 1 tablet (10 mg total) by mouth daily.   B Complex Vitamins (B COMPLEX PO) Take by mouth daily.   Biotin 10 MG TABS Take 1 tablet by mouth daily.   carbamide peroxide (DEBROX) 6.5 % OTIC solution Place 5 drops into both ears 2 (two) times daily.   cholecalciferol (VITAMIN D) 1000 UNITS tablet Take 1,000 Units by mouth daily.   fluticasone furoate-vilanterol (BREO ELLIPTA) 100-25 MCG/INH AEPB Inhale 1 puff into the lungs in the morning.   hydrochlorothiazide (HYDRODIURIL) 12.5 MG tablet Take 1 tablet (12.5 mg total) by mouth daily.   metoprolol succinate (TOPROL-XL) 25 MG 24 hr tablet Take 1 tablet (25 mg total) by mouth daily.   Multiple Vitamins-Minerals (MULTIVITAMIN WOMEN 50+ PO) Take 1 tablet by mouth daily.   Omega-3 Fatty Acids (FISH OIL) 1000 MG CAPS Take 1 capsule (1,000 mg total) by mouth daily.   oxybutynin (DITROPAN-XL) 5 MG 24 hr tablet Take 1 tablet (5 mg total) by mouth at bedtime.   pantoprazole (PROTONIX) 40 MG tablet Take 1 tablet (40 mg total) by mouth 2 (two)  times daily before a meal.   thiamine 100 MG tablet Take 1 tablet (100 mg total) by mouth daily.   traZODone (DESYREL) 50 MG tablet Take 0.5-1 tablets (25-50 mg total) by mouth at bedtime.   triamcinolone ointment (KENALOG) 0.5 % Apply 1 application topically 2 (two) times daily.   vitamin B-12 (CYANOCOBALAMIN) 1000 MCG tablet Take 1 tablet (1,000 mcg total) by mouth daily.   vitamin C (ASCORBIC ACID) 250 MG tablet Take 250 mg by mouth daily.       09/24/2021   10:13 AM 02/17/2021    8:33 AM 09/24/2020    9:06 AM 06/19/2020    1:34 PM  GAD 7 : Generalized Anxiety Score  Nervous, Anxious, on Edge 0 0 1 0  Control/stop worrying 0 0 2 0  Worry too much - different things 1  0 2 0  Trouble relaxing 1 0 2 0  Restless 0 0 0 0  Easily annoyed or irritable 0 0 0 0  Afraid - awful might happen 0 0 0 0  Total GAD 7 Score 2 0 7 0  Anxiety Difficulty Not difficult at all Not difficult at all Not difficult at all        09/24/2021   10:13 AM 02/17/2021    8:32 AM 09/24/2020    9:04 AM  Depression screen PHQ 2/9  Decreased Interest 1 0 0  Down, Depressed, Hopeless 1 0 0  PHQ - 2 Score 2 0 0  Altered sleeping 2 0 3  Tired, decreased energy 1 0 1  Change in appetite 0 0 2  Feeling bad or failure about yourself  1 0 0  Trouble concentrating 1 0 0  Moving slowly or fidgety/restless 0 0 1  Suicidal thoughts 0 0 0  PHQ-9 Score 7 0 7  Difficult doing work/chores Not difficult at all Not difficult at all Not difficult at all    BP Readings from Last 3 Encounters:  10/05/21 138/88  09/24/21 132/80  04/02/21 120/80    Physical Exam Vitals and nursing note reviewed. Exam conducted with a chaperone present.  Constitutional:      General: She is not in acute distress.    Appearance: She is not diaphoretic.  HENT:     Head: Normocephalic and atraumatic.     Right Ear: Tympanic membrane and external ear normal.     Left Ear: Tympanic membrane and external ear normal.     Nose: Nose normal. No congestion or rhinorrhea.     Mouth/Throat:     Mouth: Mucous membranes are moist.  Eyes:     General:        Right eye: No discharge.        Left eye: No discharge.     Conjunctiva/sclera: Conjunctivae normal.     Pupils: Pupils are equal, round, and reactive to light.  Neck:     Thyroid: No thyromegaly.     Vascular: No JVD.  Cardiovascular:     Rate and Rhythm: Normal rate and regular rhythm.     Heart sounds: Normal heart sounds. No murmur heard.    No friction rub. No gallop.  Pulmonary:     Effort: Pulmonary effort is normal.     Breath sounds: Normal breath sounds. No wheezing or rhonchi.  Abdominal:     General: Bowel sounds are normal.     Palpations:  Abdomen is soft. There is no mass.     Tenderness: There is no abdominal tenderness. There  is no guarding or rebound.  Musculoskeletal:        General: Normal range of motion.     Cervical back: Normal range of motion and neck supple.  Lymphadenopathy:     Cervical: No cervical adenopathy.  Skin:    General: Skin is warm and dry.  Neurological:     Mental Status: She is alert.     Deep Tendon Reflexes: Reflexes are normal and symmetric.     Wt Readings from Last 3 Encounters:  10/05/21 125 lb (56.7 kg)  09/24/21 123 lb (55.8 kg)  04/02/21 120 lb (54.4 kg)    BP 138/88   Pulse 80   Ht 5' 1" (1.549 m)   Wt 125 lb (56.7 kg)   BMI 23.62 kg/m   Assessment and Plan:  1. Essential hypertension Chronic.  Controlled.  Stable.  Asymptomatic.  Tolerating medication well.  Blood pressure today is 138/88 continue hydrochlorothiazide 12.5 mg once a day and metoprolol XL 25 mg once a day.  We will recheck in 6 months.  Review of previous renal panel acceptable. - hydrochlorothiazide (HYDRODIURIL) 12.5 MG tablet; Take 1 tablet (12.5 mg total) by mouth daily.  Dispense: 90 tablet; Refill: 1 - metoprolol succinate (TOPROL-XL) 25 MG 24 hr tablet; Take 1 tablet (25 mg total) by mouth daily.  Dispense: 90 tablet; Refill: 1  2. Familial hypercholesterolemia Chronic.  Controlled.  Stable.  Continue atorvastatin 10 mg once.  We will recheck in 6 months. - atorvastatin (LIPITOR) 10 MG tablet; Take 1 tablet (10 mg total) by mouth daily.  Dispense: 90 tablet; Refill: 1  3. Overactive bladder Chronic.  Controlled.  Tolerating medication well.  Reasonable control at night with oxybutynin 5 mg nightly. - oxybutynin (DITROPAN-XL) 5 MG 24 hr tablet; Take 1 tablet (5 mg total) by mouth at bedtime.  Dispense: 90 tablet; Refill: 1  4. Gastroesophageal reflux disease, unspecified whether esophagitis present Chronic.  Controlled.  Stable.  Continue pantoprazole 40 mg once a day. - pantoprazole (PROTONIX) 40  MG tablet; Take 1 tablet (40 mg total) by mouth 2 (two) times daily before a meal.  Dispense: 180 tablet; Refill: 1  5. Primary insomnia Controlled.  Stable.  Continue trazodone 50 mg nightly. - traZODone (DESYREL) 50 MG tablet; Take 0.5-1 tablets (25-50 mg total) by mouth at bedtime.  Dispense: 30 tablet; Refill: 5    Otilio Miu, MD

## 2021-10-22 ENCOUNTER — Inpatient Hospital Stay: Admission: RE | Admit: 2021-10-22 | Payer: BC Managed Care – PPO | Source: Ambulatory Visit

## 2021-11-11 ENCOUNTER — Inpatient Hospital Stay: Admission: RE | Admit: 2021-11-11 | Payer: BC Managed Care – PPO | Source: Ambulatory Visit

## 2021-11-24 ENCOUNTER — Other Ambulatory Visit
Admission: RE | Admit: 2021-11-24 | Discharge: 2021-11-24 | Disposition: A | Discharge status: Home / Self Care | Payer: BC Managed Care – PPO | Attending: Family Medicine | Admitting: Family Medicine

## 2021-11-24 ENCOUNTER — Encounter: Payer: Self-pay | Admitting: Family Medicine

## 2021-11-24 ENCOUNTER — Ambulatory Visit: Payer: BC Managed Care – PPO | Admitting: Family Medicine

## 2021-11-24 ENCOUNTER — Other Ambulatory Visit: Payer: Self-pay

## 2021-11-24 ENCOUNTER — Telehealth: Payer: Self-pay | Admitting: *Deleted

## 2021-11-24 VITALS — BP 126/78 | HR 64 | Ht 61.0 in | Wt 124.0 lb

## 2021-11-24 DIAGNOSIS — Z23 Encounter for immunization: Secondary | ICD-10-CM

## 2021-11-24 DIAGNOSIS — R1012 Left upper quadrant pain: Secondary | ICD-10-CM | POA: Diagnosis present

## 2021-11-24 DIAGNOSIS — K5792 Diverticulitis of intestine, part unspecified, without perforation or abscess without bleeding: Secondary | ICD-10-CM | POA: Diagnosis not present

## 2021-11-24 DIAGNOSIS — R1032 Left lower quadrant pain: Secondary | ICD-10-CM | POA: Diagnosis not present

## 2021-11-24 LAB — CBC WITH DIFFERENTIAL/PLATELET
Abs Immature Granulocytes: 0.01 10*3/uL (ref 0.00–0.07)
Basophils Absolute: 0.1 10*3/uL (ref 0.0–0.1)
Basophils Relative: 1 %
Eosinophils Absolute: 0.3 10*3/uL (ref 0.0–0.5)
Eosinophils Relative: 5 %
HCT: 36.1 % (ref 36.0–46.0)
Hemoglobin: 11.3 g/dL — ABNORMAL LOW (ref 12.0–15.0)
Immature Granulocytes: 0 %
Lymphocytes Relative: 26 %
Lymphs Abs: 1.4 10*3/uL (ref 0.7–4.0)
MCH: 29.4 pg (ref 26.0–34.0)
MCHC: 31.3 g/dL (ref 30.0–36.0)
MCV: 93.8 fL (ref 80.0–100.0)
Monocytes Absolute: 0.6 10*3/uL (ref 0.1–1.0)
Monocytes Relative: 11 %
Neutro Abs: 3 10*3/uL (ref 1.7–7.7)
Neutrophils Relative %: 57 %
Platelets: 259 10*3/uL (ref 150–400)
RBC: 3.85 MIL/uL — ABNORMAL LOW (ref 3.87–5.11)
RDW: 12 % (ref 11.5–15.5)
WBC: 5.2 10*3/uL (ref 4.0–10.5)
nRBC: 0 % (ref 0.0–0.2)

## 2021-11-24 LAB — POCT URINALYSIS DIPSTICK
Bilirubin, UA: NEGATIVE
Blood, UA: NEGATIVE
Glucose, UA: NEGATIVE
Ketones, UA: NEGATIVE
Leukocytes, UA: NEGATIVE
Nitrite, UA: NEGATIVE
Protein, UA: NEGATIVE
Spec Grav, UA: 1.01 (ref 1.010–1.025)
Urobilinogen, UA: 0.2 E.U./dL
pH, UA: 6 (ref 5.0–8.0)

## 2021-11-24 LAB — HEMOCCULT GUIAC POC 1CARD (OFFICE): Fecal Occult Blood, POC: NEGATIVE

## 2021-11-24 MED ORDER — AMOXICILLIN-POT CLAVULANATE 875-125 MG PO TABS: 1.0000 | ORAL_TABLET | Freq: Two times a day (BID) | ORAL | 0 refills | Status: DC

## 2021-11-24 MED ORDER — METRONIDAZOLE 500 MG PO TABS: 500.0000 mg | ORAL_TABLET | Freq: Two times a day (BID) | ORAL | 0 refills | Status: DC

## 2021-11-24 MED ORDER — METRONIDAZOLE 500 MG PO TABS: 500.0000 mg | ORAL_TABLET | Freq: Two times a day (BID) | ORAL | 0 refills | Status: AC

## 2021-11-24 NOTE — Telephone Encounter (Signed)
'  Ok Edwards' with [facility] calling for clarification on Flagyl. Call transferred to practice, Estill Bamberg. Pharmacist spoke with Judson Roch.

## 2021-11-24 NOTE — Progress Notes (Signed)
Date:  11/24/2021   Name:  Samantha Irwin [facility]   DOB:  11-Nov-1960   MRN:  [id number]   Chief Complaint: Abdominal Pain (LLQ abd pain) and Flu Vaccine  Abdominal Pain This is a new problem. The current episode started yesterday. The onset quality is sudden. The problem occurs constantly. The problem has been waxing and waning. The pain is located in the LLQ. The pain is at a severity of 3/10. The pain is moderate. The quality of the pain is colicky. The abdominal pain does not radiate. Pertinent negatives include no constipation, diarrhea, dysuria, fever, frequency, headaches, hematochezia, hematuria, myalgias, nausea or weight loss. The pain is aggravated by palpation. She has tried acetaminophen for the symptoms. The treatment provided mild relief.    Lab Results  Component Value Date   NA 138 09/24/2021   K 4.3 09/24/2021   CO2 26 09/24/2021   GLUCOSE 103 (H) 09/24/2021   BUN 19 09/24/2021   CREATININE 1.06 (H) 09/24/2021   CALCIUM 10.1 09/24/2021   EGFR 59 (L) 09/24/2021   GFRNONAA >60 06/08/2020   Lab Results  Component Value Date   CHOL 217 (H) 09/24/2021   HDL 92 09/24/2021   LDLCALC 107 (H) 09/24/2021   TRIG 104 09/24/2021   CHOLHDL 3.2 05/30/2018   Lab Results  Component Value Date   TSH 0.868 02/17/2021   Lab Results  Component Value Date   HGBA1C 5.2 09/13/2016   Lab Results  Component Value Date   WBC 4.4 02/17/2021   HGB 11.7 02/17/2021   HCT 33.9 (L) 02/17/2021   MCV 86 02/17/2021   PLT 250 02/17/2021   Lab Results  Component Value Date   ALT 20 02/17/2021   AST 25 02/17/2021   ALKPHOS 91 02/17/2021   BILITOT 0.3 02/17/2021   No results found for: "25OHVITD2", "25OHVITD3", "VD25OH"   Review of Systems  Constitutional:  Negative for chills, fever and weight loss.  HENT:  Negative for drooling, ear discharge, ear pain and sore throat.   Respiratory:  Negative for cough, shortness of breath and wheezing.   Cardiovascular:  Negative for chest  pain, palpitations and leg swelling.  Gastrointestinal:  Positive for abdominal pain. Negative for blood in stool, constipation, diarrhea, hematochezia and nausea.  Endocrine: Negative for polydipsia.  Genitourinary:  Negative for dysuria, frequency, hematuria and urgency.  Musculoskeletal:  Negative for back pain, myalgias and neck pain.  Skin:  Negative for rash.  Allergic/Immunologic: Negative for environmental allergies.  Neurological:  Negative for dizziness and headaches.  Hematological:  Does not bruise/bleed easily.  Psychiatric/Behavioral:  Negative for suicidal ideas. The patient is not nervous/anxious.     Patient Active Problem List   Diagnosis Date Noted   Overactive bladder 05/30/2018   Chronic obstructive pulmonary disease ([place]) 05/30/2018   Familial hypercholesterolemia 05/30/2018   Flexural eczema 05/30/2018   Cigarette nicotine dependence without complication 62/95/2841   Atherosclerosis of aorta ([place]) 05/12/2017   Essential hypertension 06/12/2015    No Known Allergies  Past Surgical History:  Procedure Laterality Date   CESAREAN SECTION     COLONOSCOPY  2012   cleared for 5 yrs- [place]   COLONOSCOPY WITH PROPOFOL N/A 11/02/2016   Procedure: COLONOSCOPY WITH PROPOFOL;  Surgeon: Lin Landsman, MD;  Location: [facility];  Service: Gastroenterology;  Laterality: N/A;   ERCP N/A 06/05/2020   Procedure: ENDOSCOPIC RETROGRADE CHOLANGIOPANCREATOGRAPHY (ERCP);  Surgeon: Lucilla Lame, MD;  Location[facility] ENDOSCOPY;  Service: Endoscopy;  Laterality: N/A;  ESOPHAGOGASTRODUODENOSCOPY (EGD) WITH PROPOFOL N/A 06/05/2020   Procedure: ESOPHAGOGASTRODUODENOSCOPY (EGD) WITH PROPOFOL;  Surgeon: Lucilla Lame, MD;  Location: [facility] ENDOSCOPY;  Service: Endoscopy;  Laterality: N/A;    Social History   Tobacco Use   Smoking status: Former    Packs/day: 0.00    Types: Cigarettes    Quit date: 06/09/2020    Years since quitting: 1.4   Smokeless tobacco: Never    Tobacco comments:    gave info on patches and pills  Vaping Use   Vaping Use: Never used  Substance Use Topics   Alcohol use: Not Currently    Alcohol/week: 3.0 standard drinks of alcohol    Types: 3 Glasses of wine per week   Drug use: No     Medication list has been reviewed and updated.  Current Meds  Medication Sig   albuterol (VENTOLIN HFA) 108 (90 Base) MCG/ACT inhaler Inhale 2 puffs into the lungs every 6 (six) hours as needed for wheezing or shortness of breath.   atorvastatin (LIPITOR) 10 MG tablet Take 1 tablet (10 mg total) by mouth daily.   B Complex Vitamins (B COMPLEX PO) Take by mouth daily.   Biotin 10 MG TABS Take 1 tablet by mouth daily.   carbamide peroxide (DEBROX) 6.5 % OTIC solution Place 5 drops into both ears 2 (two) times daily.   cholecalciferol (VITAMIN D) 1000 UNITS tablet Take 1,000 Units by mouth daily.   fluticasone furoate-vilanterol (BREO ELLIPTA) 100-25 MCG/INH AEPB Inhale 1 puff into the lungs in the morning.   hydrochlorothiazide (HYDRODIURIL) 12.5 MG tablet Take 1 tablet (12.5 mg total) by mouth daily.   metoprolol succinate (TOPROL-XL) 25 MG 24 hr tablet Take 1 tablet (25 mg total) by mouth daily.   Multiple Vitamins-Minerals (MULTIVITAMIN WOMEN 50+ PO) Take 1 tablet by mouth daily.   Omega-3 Fatty Acids (FISH OIL) 1000 MG CAPS Take 1 capsule (1,000 mg total) by mouth daily.   oxybutynin (DITROPAN-XL) 5 MG 24 hr tablet Take 1 tablet (5 mg total) by mouth at bedtime.   pantoprazole (PROTONIX) 40 MG tablet Take 1 tablet (40 mg total) by mouth 2 (two) times daily before a meal.   thiamine 100 MG tablet Take 1 tablet (100 mg total) by mouth daily.   traZODone (DESYREL) 50 MG tablet Take 0.5-1 tablets (25-50 mg total) by mouth at bedtime.   triamcinolone ointment (KENALOG) 0.5 % Apply 1 application topically 2 (two) times daily.   vitamin B-12 (CYANOCOBALAMIN) 1000 MCG tablet Take 1 tablet (1,000 mcg total) by mouth daily.   vitamin C (ASCORBIC ACID)  250 MG tablet Take 250 mg by mouth daily.       11/24/2021    2:36 PM 09/24/2021   10:13 AM 02/17/2021    8:33 AM 09/24/2020    9:06 AM  GAD 7 : Generalized Anxiety Score  Nervous, Anxious, on Edge 0 0 0 1  Control/stop worrying 0 0 0 2  Worry too much - different things 0 1 0 2  Trouble relaxing 0 1 0 2  Restless 0 0 0 0  Easily annoyed or irritable 0 0 0 0  Afraid - awful might happen 0 0 0 0  Total GAD 7 Score 0 2 0 7  Anxiety Difficulty Not difficult at all Not difficult at all Not difficult at all Not difficult at all       11/24/2021    2:36 PM 09/24/2021   10:13 AM 02/17/2021    8:32 AM  Depression  screen PHQ 2/9  Decreased Interest 0 1 0  Down, Depressed, Hopeless 0 1 0  PHQ - 2 Score 0 2 0  Altered sleeping 0 2 0  Tired, decreased energy 0 1 0  Change in appetite 0 0 0  Feeling bad or failure about yourself  0 1 0  Trouble concentrating 0 1 0  Moving slowly or fidgety/restless 0 0 0  Suicidal thoughts 0 0 0  PHQ-9 Score 0 7 0  Difficult doing work/chores Not difficult at all Not difficult at all Not difficult at all    BP Readings from Last 3 Encounters:  11/24/21 126/78  10/05/21 138/88  09/24/21 132/80    Physical Exam Vitals and nursing note reviewed. Exam conducted with a chaperone present.  Constitutional:      General: She is not in acute distress.    Appearance: She is not diaphoretic.  HENT:     Head: Normocephalic and atraumatic.     Right Ear: External ear normal.     Left Ear: External ear normal.     Nose: Nose normal.  Eyes:     General:        Right eye: No discharge.        Left eye: No discharge.     Conjunctiva/sclera: Conjunctivae normal.     Pupils: Pupils are equal, round, and reactive to light.  Neck:     Thyroid: No thyromegaly.     Vascular: No JVD.  Cardiovascular:     Rate and Rhythm: Normal rate and regular rhythm.     Heart sounds: Normal heart sounds. No murmur heard.    No friction rub. No gallop.  Pulmonary:      Effort: Pulmonary effort is normal.     Breath sounds: Normal breath sounds.  Abdominal:     General: Bowel sounds are normal.     Palpations: Abdomen is soft. There is no hepatomegaly, splenomegaly or mass.     Tenderness: There is abdominal tenderness. There is rebound. There is no right CVA tenderness, left CVA tenderness or guarding.  Musculoskeletal:        General: Normal range of motion.     Cervical back: Normal range of motion and neck supple.  Lymphadenopathy:     Cervical: No cervical adenopathy.  Skin:    General: Skin is warm and dry.  Neurological:     Mental Status: She is alert.     Deep Tendon Reflexes: Reflexes are normal and symmetric.     Wt Readings from Last 3 Encounters:  11/24/21 124 lb (56.2 kg)  10/05/21 125 lb (56.7 kg)  09/24/21 123 lb (55.8 kg)    BP 126/78   Pulse 64   Ht $R'5\' 1"'SB$  (1.549 m)   Wt 124 lb (56.2 kg)   BMI 23.43 kg/m   Assessment and Plan:  1. Diverticulitis New onset from an underlying condition.  Patient has been eating popcorn and tomatoes and onset 2 days ago left lower quadrant pain with tenderness.  Review of colonoscopy is consistent with diverticulosis.  We will treat with combination of Augmentin 875 mg twice a day and metronidazole 500 mg twice a day.  If pain should worsen or become more tender patient is to go to the hospital. - amoxicillin-clavulanate (AUGMENTIN) 875-125 MG tablet; Take 1 tablet by mouth 2 (two) times daily.  Dispense: 20 tablet; Refill: 0  2. Left lower quadrant abdominal pain We will check urinalysis and occult blood both of which are  negative for infection. - POCT urinalysis dipstick - POCT occult blood stool  3. Need for immunization against influenza Discussed and administered. - Flu Vaccine QUAD 8mo+IM (Fluarix, Fluzone & Alfiuria Quad PF)    Otilio Miu, MD

## 2021-11-26 ENCOUNTER — Telehealth: Payer: Self-pay | Admitting: Family Medicine

## 2021-11-26 NOTE — Telephone Encounter (Signed)
Copied from [place] [id number]. Topic: General - Other >> Nov 26, 2021  9:05 AM Ludger Nutting wrote: Patient is requesting a callback from Baxter Flattery to discuss how she is feeling since visit on 11/24/21.

## 2021-12-01 ENCOUNTER — Ambulatory Visit
Admission: RE | Admit: 2021-12-01 | Discharge: 2021-12-01 | Disposition: A | Discharge status: Home / Self Care | Payer: BC Managed Care – PPO | Source: Ambulatory Visit | Attending: Family Medicine | Admitting: Family Medicine

## 2021-12-01 DIAGNOSIS — Z1231 Encounter for screening mammogram for malignant neoplasm of breast: Secondary | ICD-10-CM | POA: Insufficient documentation

## 2021-12-07 ENCOUNTER — Ambulatory Visit: Payer: BC Managed Care – PPO | Admitting: Urology

## 2021-12-08 ENCOUNTER — Encounter: Payer: Self-pay | Admitting: Urology

## 2022-01-13 ENCOUNTER — Ambulatory Visit: Payer: Self-pay

## 2022-01-13 NOTE — Telephone Encounter (Signed)
Patient called, left VM to return the call to the office to discuss symptoms with a nurse.  Summary: fever blister   Pt states she has a fever blister on her lip that is now a sore and pt wants to see if Dr. Yetta Barre can send something in for this / please advise

## 2022-01-13 NOTE — Telephone Encounter (Signed)
    Chief Complaint: Fever blisters to upper lip. Fluid filled, not on face. OTC usually helps, not helping now. Has appointment tomorrow, but asking if medication could be called in. Symptoms: Painful. Frequency: Saturday Pertinent Negatives: Patient denies fever Disposition: [] ED /[] Urgent Care (no appt availability in office) / [x] Appointment(In office/virtual)/ []  [facility]/ [] Home Care/ [] Refused Recommended Disposition /[] [facility]/ []  Follow-up with PCP Additional Notes: Asking for medication be sent to pharmacy. Please advise   Reason for Disposition  [1] Red streak or red area spreading from cold sore AND [2] no fever  Answer Assessment - Initial Assessment Questions 1. APPEARANCE of BLISTERS: "Describe the sores."     Some fluid in blisters 2. SIZE: "How large an area is involved with the cold sores?" (e.g., inches, cm or compare to coins)     Large 3. LOCATION: "Which part of the lip is involved?"     Upper lip 4. ONSET: "When did the fever blisters begin?"     Saturday 5. RECURRENT BLISTERS: "Have you had fever blisters before?" If Yes, ask: "When was the last time?" "How many times a year?"     Yes 6. OTHER SYMPTOMS: "Do you have any other symptoms?" (e.g., fever, sores inside mouth)     No 7. PREGNANCY: "Is there any chance you are pregnant?" "When was your last menstrual period?"     No  Protocols used: Cold Sores (Fever Blisters)-A-AH

## 2022-01-14 ENCOUNTER — Ambulatory Visit: Payer: BC Managed Care – PPO | Admitting: Family Medicine

## 2022-01-14 ENCOUNTER — Encounter: Payer: Self-pay | Admitting: Family Medicine

## 2022-01-14 ENCOUNTER — Telehealth: Payer: Self-pay

## 2022-01-14 VITALS — BP 148/100 | HR 80 | Ht 61.0 in | Wt 124.0 lb

## 2022-01-14 DIAGNOSIS — H6123 Impacted cerumen, bilateral: Secondary | ICD-10-CM | POA: Diagnosis not present

## 2022-01-14 DIAGNOSIS — B349 Viral infection, unspecified: Secondary | ICD-10-CM | POA: Diagnosis not present

## 2022-01-14 DIAGNOSIS — L0100 Impetigo, unspecified: Secondary | ICD-10-CM

## 2022-01-14 DIAGNOSIS — I10 Essential (primary) hypertension: Secondary | ICD-10-CM

## 2022-01-14 MED ORDER — VALACYCLOVIR HCL 1 G PO TABS: 1000.0000 mg | ORAL_TABLET | Freq: Two times a day (BID) | ORAL | 0 refills | Status: AC

## 2022-01-14 MED ORDER — CEPHALEXIN 500 MG PO CAPS: 500.0000 mg | ORAL_CAPSULE | Freq: Three times a day (TID) | ORAL | 0 refills | Status: DC

## 2022-01-14 NOTE — Progress Notes (Signed)
Date:  01/14/2022   Name:  Samantha Irwin   DOB:  01-Jul-1960   MRN:  [id number]   Chief Complaint: fever blister (2 on lips side by side started in the corner on Saturday)  Patient is a 61 year old female who presents for a viral syndrome exam. 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 Health maintenance has been reviewed up to date.      Lab Results  Component Value Date   NA 138 09/24/2021   K 4.3 09/24/2021   CO2 26 09/24/2021   GLUCOSE 103 (H) 09/24/2021   BUN 19 09/24/2021   CREATININE 1.06 (H) 09/24/2021   CALCIUM 10.1 09/24/2021   EGFR 59 (L) 09/24/2021   GFRNONAA >60 06/08/2020   Lab Results  Component Value Date   CHOL 217 (H) 09/24/2021   HDL 92 09/24/2021   LDLCALC 107 (H) 09/24/2021   TRIG 104 09/24/2021   CHOLHDL 3.2 05/30/2018   Lab Results  Component Value Date   TSH 0.868 02/17/2021   Lab Results  Component Value Date   HGBA1C 5.2 09/13/2016   Lab Results  Component Value Date   WBC 5.2 11/24/2021   HGB 11.3 (L) 11/24/2021   HCT 36.1 11/24/2021   MCV 93.8 11/24/2021   PLT 259 11/24/2021   Lab Results  Component Value Date   ALT 20 02/17/2021   AST 25 02/17/2021   ALKPHOS 91 02/17/2021   BILITOT 0.3 02/17/2021   No results found for: "25OHVITD2", "25OHVITD3", "VD25OH"   Review of Systems  Constitutional: Negative.  Negative for chills, fatigue and fever.  HENT:  Negative for congestion, ear discharge, ear pain, rhinorrhea, sinus pressure and sneezing.   Respiratory:  Negative for cough, shortness of breath and wheezing.   Gastrointestinal:  Negative for abdominal pain, constipation, diarrhea and nausea.  Musculoskeletal:  Negative for myalgias.  Skin:  Negative for rash.  Neurological:  Negative for dizziness, weakness and headaches.  Hematological:  Negative for adenopathy. Does not bruise/bleed easily.  Psychiatric/Behavioral:  Negative for dysphoric mood. The patient is not nervous/anxious.     Patient  Active Problem List   Diagnosis Date Noted   Overactive bladder 05/30/2018   Chronic obstructive pulmonary disease ([place]) 05/30/2018   Familial hypercholesterolemia 05/30/2018   Flexural eczema 05/30/2018   Cigarette nicotine dependence without complication 17/79/3903   Atherosclerosis of aorta ([place]) 05/12/2017   Essential hypertension 06/12/2015    No Known Allergies  Past Surgical History:  Procedure Laterality Date   CESAREAN SECTION     COLONOSCOPY  2012   cleared for 5 yrs- [place]   COLONOSCOPY WITH PROPOFOL N/A 11/02/2016   Procedure: COLONOSCOPY WITH PROPOFOL;  Surgeon: Lin Landsman, MD;  Location: [facility];  Service: Gastroenterology;  Laterality: N/A;   ERCP N/A 06/05/2020   Procedure: ENDOSCOPIC RETROGRADE CHOLANGIOPANCREATOGRAPHY (ERCP);  Surgeon: Lucilla Lame, MD;  Location: [facility] ENDOSCOPY;  Service: Endoscopy;  Laterality: N/A;   ESOPHAGOGASTRODUODENOSCOPY (EGD) WITH PROPOFOL N/A 06/05/2020   Procedure: ESOPHAGOGASTRODUODENOSCOPY (EGD) WITH PROPOFOL;  Surgeon: Lucilla Lame, MD;  Location: [facility] ENDOSCOPY;  Service: Endoscopy;  Laterality: N/A;    Social History   Tobacco Use   Smoking status: Former    Packs/day: 0.00    Types: Cigarettes    Quit date: 06/09/2020    Years since quitting: 1.6   Smokeless tobacco: Never   Tobacco comments:    gave info on patches and pills  Vaping Use   Vaping Use: Never used  Substance Use Topics   Alcohol use: Not Currently    Alcohol/week: 3.0 standard drinks of alcohol    Types: 3 Glasses of wine per week   Drug use: No     Medication list has been reviewed and updated.  Current Meds  Medication Sig   albuterol (VENTOLIN HFA) 108 (90 Base) MCG/ACT inhaler Inhale 2 puffs into the lungs every 6 (six) hours as needed for wheezing or shortness of breath.   atorvastatin (LIPITOR) 10 MG tablet Take 1 tablet (10 mg total) by mouth daily.   B Complex Vitamins (B COMPLEX PO) Take by mouth daily.   Biotin 10 MG  TABS Take 1 tablet by mouth daily.   carbamide peroxide (DEBROX) 6.5 % OTIC solution Place 5 drops into both ears 2 (two) times daily.   cholecalciferol (VITAMIN D) 1000 UNITS tablet Take 1,000 Units by mouth daily.   fluticasone furoate-vilanterol (BREO ELLIPTA) 100-25 MCG/INH AEPB Inhale 1 puff into the lungs in the morning.   hydrochlorothiazide (HYDRODIURIL) 12.5 MG tablet Take 1 tablet (12.5 mg total) by mouth daily.   metoprolol succinate (TOPROL-XL) 25 MG 24 hr tablet Take 1 tablet (25 mg total) by mouth daily.   Multiple Vitamins-Minerals (MULTIVITAMIN WOMEN 50+ PO) Take 1 tablet by mouth daily.   Omega-3 Fatty Acids (FISH OIL) 1000 MG CAPS Take 1 capsule (1,000 mg total) by mouth daily.   oxybutynin (DITROPAN-XL) 5 MG 24 hr tablet Take 1 tablet (5 mg total) by mouth at bedtime.   pantoprazole (PROTONIX) 40 MG tablet Take 1 tablet (40 mg total) by mouth 2 (two) times daily before a meal.   thiamine 100 MG tablet Take 1 tablet (100 mg total) by mouth daily.   traZODone (DESYREL) 50 MG tablet Take 0.5-1 tablets (25-50 mg total) by mouth at bedtime.   triamcinolone ointment (KENALOG) 0.5 % Apply 1 application topically 2 (two) times daily.   vitamin B-12 (CYANOCOBALAMIN) 1000 MCG tablet Take 1 tablet (1,000 mcg total) by mouth daily.   vitamin C (ASCORBIC ACID) 250 MG tablet Take 250 mg by mouth daily.       01/14/2022    3:57 PM 11/24/2021    2:36 PM 09/24/2021   10:13 AM 02/17/2021    8:33 AM  GAD 7 : Generalized Anxiety Score  Nervous, Anxious, on Edge 0 0 0 0  Control/stop worrying 0 0 0 0  Worry too much - different things 0 0 1 0  Trouble relaxing 0 0 1 0  Restless 0 0 0 0  Easily annoyed or irritable 0 0 0 0  Afraid - awful might happen 0 0 0 0  Total GAD 7 Score 0 0 2 0  Anxiety Difficulty Not difficult at all Not difficult at all Not difficult at all Not difficult at all       01/14/2022    3:57 PM 11/24/2021    2:36 PM 09/24/2021   10:13 AM  Depression screen PHQ 2/9   Decreased Interest 0 0 1  Down, Depressed, Hopeless 0 0 1  PHQ - 2 Score 0 0 2  Altered sleeping 0 0 2  Tired, decreased energy 0 0 1  Change in appetite 0 0 0  Feeling bad or failure about yourself  0 0 1  Trouble concentrating 0 0 1  Moving slowly or fidgety/restless 0 0 0  Suicidal thoughts 0 0 0  PHQ-9 Score 0 0 7  Difficult doing work/chores Not difficult at all Not difficult at all  Not difficult at all    BP Readings from Last 3 Encounters:  01/14/22 (!) 148/100  11/24/21 126/78  10/05/21 138/88    Physical Exam Vitals and nursing note reviewed. Exam conducted with a chaperone present.  Constitutional:      General: She is not in acute distress.    Appearance: She is not diaphoretic.  HENT:     Head: Normocephalic and atraumatic.     Right Ear: External ear normal.     Left Ear: External ear normal.     Nose: Nose normal.  Eyes:     General:        Right eye: No discharge.        Left eye: No discharge.     Conjunctiva/sclera: Conjunctivae normal.     Pupils: Pupils are equal, round, and reactive to light.  Neck:     Thyroid: No thyromegaly.     Vascular: No JVD.  Cardiovascular:     Rate and Rhythm: Normal rate and regular rhythm.     Heart sounds: Normal heart sounds. No murmur heard.    No friction rub. No gallop.  Pulmonary:     Effort: Pulmonary effort is normal.     Breath sounds: Normal breath sounds. No wheezing or rhonchi.  Abdominal:     General: Bowel sounds are normal.     Palpations: Abdomen is soft. There is no mass.     Tenderness: There is no abdominal tenderness. There is no guarding.  Musculoskeletal:        General: Normal range of motion.     Cervical back: Normal range of motion and neck supple.  Lymphadenopathy:     Cervical: No cervical adenopathy.  Skin:    General: Skin is warm and dry.  Neurological:     Mental Status: She is alert.     Wt Readings from Last 3 Encounters:  01/14/22 124 lb (56.2 kg)  11/24/21 124 lb  (56.2 kg)  10/05/21 125 lb (56.7 kg)    BP (!) 148/100 (BP Location: Right Arm, Cuff Size: Normal)   Pulse 80   Ht _0  (1.549 m)   Wt 124 lb (56.2 kg)   SpO2 96%   BMI 23.43 kg/m   Assessment and Plan:  1. Viral syndrome New onset.  Persistent.  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  We will treat with Valtrex 2 g twice a day for one 1 day as a single dosing..  We will recheck on an as-needed basis patient has been given refills for recurrence.  2. Impetigo Some swelling none consistent with impetigo we will treat with cephalexin 500 mg 3 times a day for 5 days.  3. Bilateral impacted cerumen Chronic.  Episodic.  There is been buildup of cerumen again.  We will treat with Debrox.  4. Essential hypertension Chronic.  Uncontrolled.  Because patient has not taken her medication for 2 maybe 3 days.  Because she is so wrapped up with her fever blisters.  We have reinforced that is important to take his medications and she will consider starting those again tonight.   Otilio Miu, MD

## 2022-01-14 NOTE — Telephone Encounter (Signed)
Samantha Irwin, [facility] from [facility] calling for clarification on Valtrex.   Sig: Take 1 tablet (1,000 mg total) by mouth 2 (two) times daily for 2 days. Change to 2 tablets twice a day for 1 day   Advised that OV notes states . Viral syndrome New onset.  Persistent.  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  We will treat with Valtrex 2 g twice a day for one 1 day as a single dosing..  We will recheck on an as-needed basis patient has been given refills for recurrence.  No further assistance was needed.

## 2022-01-17 ENCOUNTER — Other Ambulatory Visit: Payer: Self-pay | Admitting: Family Medicine

## 2022-01-18 ENCOUNTER — Encounter: Payer: Self-pay | Admitting: Emergency Medicine

## 2022-01-18 ENCOUNTER — Ambulatory Visit
Admission: EM | Admit: 2022-01-18 | Discharge: 2022-01-18 | Disposition: A | Discharge status: Home / Self Care | Payer: BC Managed Care – PPO | Attending: Physician Assistant | Admitting: Physician Assistant

## 2022-01-18 ENCOUNTER — Ambulatory Visit (INDEPENDENT_AMBULATORY_CARE_PROVIDER_SITE_OTHER): Payer: BC Managed Care – PPO

## 2022-01-18 DIAGNOSIS — S81001A Unspecified open wound, right knee, initial encounter: Secondary | ICD-10-CM | POA: Diagnosis not present

## 2022-01-18 DIAGNOSIS — Z792 Long term (current) use of antibiotics: Secondary | ICD-10-CM | POA: Insufficient documentation

## 2022-01-18 DIAGNOSIS — W19XXXA Unspecified fall, initial encounter: Secondary | ICD-10-CM | POA: Diagnosis not present

## 2022-01-18 DIAGNOSIS — M25561 Pain in right knee: Secondary | ICD-10-CM | POA: Diagnosis not present

## 2022-01-18 DIAGNOSIS — M25461 Effusion, right knee: Secondary | ICD-10-CM | POA: Insufficient documentation

## 2022-01-18 DIAGNOSIS — S80211A Abrasion, right knee, initial encounter: Secondary | ICD-10-CM | POA: Insufficient documentation

## 2022-01-18 DIAGNOSIS — S8991XA Unspecified injury of right lower leg, initial encounter: Secondary | ICD-10-CM | POA: Insufficient documentation

## 2022-01-18 LAB — CBC WITH DIFFERENTIAL/PLATELET
Abs Immature Granulocytes: 0.01 10*3/uL (ref 0.00–0.07)
Basophils Absolute: 0 10*3/uL (ref 0.0–0.1)
Basophils Relative: 1 %
Eosinophils Absolute: 0.1 10*3/uL (ref 0.0–0.5)
Eosinophils Relative: 1 %
HCT: 36.4 % (ref 36.0–46.0)
Hemoglobin: 12 g/dL (ref 12.0–15.0)
Immature Granulocytes: 0 %
Lymphocytes Relative: 21 %
Lymphs Abs: 1.1 10*3/uL (ref 0.7–4.0)
MCH: 30.1 pg (ref 26.0–34.0)
MCHC: 33 g/dL (ref 30.0–36.0)
MCV: 91.2 fL (ref 80.0–100.0)
Monocytes Absolute: 0.5 10*3/uL (ref 0.1–1.0)
Monocytes Relative: 10 %
Neutro Abs: 3.5 10*3/uL (ref 1.7–7.7)
Neutrophils Relative %: 67 %
Platelets: 280 10*3/uL (ref 150–400)
RBC: 3.99 MIL/uL (ref 3.87–5.11)
RDW: 12 % (ref 11.5–15.5)
WBC: 5.1 10*3/uL (ref 4.0–10.5)
nRBC: 0 % (ref 0.0–0.2)

## 2022-01-18 LAB — BASIC METABOLIC PANEL
Anion gap: 11 (ref 5–15)
BUN: 18 mg/dL (ref 8–23)
CO2: 27 mmol/L (ref 22–32)
Calcium: 9.3 mg/dL (ref 8.9–10.3)
Chloride: 99 mmol/L (ref 98–111)
Creatinine, Ser: 1.05 mg/dL — ABNORMAL HIGH (ref 0.44–1.00)
GFR, Estimated: 60 mL/min — ABNORMAL LOW (ref >60)
Glucose, Bld: 104 mg/dL — ABNORMAL HIGH (ref 70–99)
Potassium: 4.3 mmol/L (ref 3.5–5.1)
Sodium: 137 mmol/L (ref 135–145)

## 2022-01-18 MED ORDER — DOXYCYCLINE HYCLATE 100 MG PO CAPS: 100.0000 mg | ORAL_CAPSULE | Freq: Two times a day (BID) | ORAL | 0 refills | Status: DC

## 2022-01-18 MED ORDER — TETANUS-DIPHTH-ACELL PERTUSSIS 5-2.5-18.5 LF-MCG/0.5 IM SUSY
0.5000 mL | PREFILLED_SYRINGE | Freq: Once | INTRAMUSCULAR | Status: AC
  Administered 2022-01-18: 0.5 mL via INTRAMUSCULAR

## 2022-01-18 MED ORDER — MUPIROCIN 2 % EX OINT: 1.0000 | TOPICAL_OINTMENT | Freq: Two times a day (BID) | CUTANEOUS | 0 refills | Status: DC

## 2022-01-18 NOTE — ED Provider Notes (Signed)
[facility]    CSN: [id number] Arrival date & time: 01/18/22  [id number]      History   Chief Complaint Chief Complaint  Patient presents with   Fall    HPI Samantha Irwin is a 61 y.o. female.   Patient presents today with a 1 day history of right knee pain.  Reports that yesterday she was going up the concrete stairs in front of her house when the heel of her shoe caught the stair causing her to fall forward onto her right knee.  She denies any head injury, dizziness, loss of consciousness, nausea, vomiting, amnesia surrounding event.  She does not take any blood thinning medication.  She did clear the associated abrasion/wound with soap and water and apply Neosporin.  She has tried Tylenol without improvement of symptoms.  Her last tetanus was 09/13/2016.  She denies history of recurrent skin infections or MRSA.  Denies any recent antibiotics.  She reports that pain is rated 6 at rest but increases to 8 with attempted ambulation, described as pressure/sharp, no aggravating or alleviating factors identified.  Denies any associated fever, nausea, vomiting, weakness.  She has noticed some drainage from the wound.    Past Medical History:  Diagnosis Date   Angiodysplasia of stomach and duodenum: Per EGD 06/05/2020 06/06/2020   Choledocholithiasis 06/08/2020   Gastric ulcer: Per ERCP 06/05/2020 06/06/2020   Gastritis without bleeding    Hypertension    Post-ERCP acute pancreatitis 06/06/2020   Wears dentures    full upper, partial lower    Patient Active Problem List   Diagnosis Date Noted   Overactive bladder 05/30/2018   Chronic obstructive pulmonary disease ([facility]) 05/30/2018   Familial hypercholesterolemia 05/30/2018   Flexural eczema 05/30/2018   Cigarette nicotine dependence without complication 05/30/2018   Atherosclerosis of aorta ([facility]) 05/12/2017   Essential hypertension 06/12/2015    Past Surgical History:  Procedure Laterality Date   CESAREAN SECTION      COLONOSCOPY  2012   cleared for 5 yrs- [place]   COLONOSCOPY WITH PROPOFOL N/A 11/02/2016   Procedure: COLONOSCOPY WITH PROPOFOL;  Surgeon: Toney ReilVanga, Rohini Reddy, MD;  [facility];  Service: Gastroenterology;  Laterality: N/A;   ERCP N/A 06/05/2020   Procedure: ENDOSCOPIC RETROGRADE CHOLANGIOPANCREATOGRAPHY (ERCP);  Surgeon: Midge MiniumWohl, Darren, MD;  [facility];  Service: Endoscopy;  Laterality: N/A;   ESOPHAGOGASTRODUODENOSCOPY (EGD) WITH PROPOFOL N/A 06/05/2020   Procedure: ESOPHAGOGASTRODUODENOSCOPY (EGD) WITH PROPOFOL;  Surgeon: Midge MiniumWohl, Darren, MD;  [facility];  Service: Endoscopy;  Laterality: N/A;    OB History   No obstetric history on file.      Home Medications    Prior to Admission medications   Medication Sig Start Date End Date Taking? Authorizing Provider  doxycycline (VIBRAMYCIN) 100 MG capsule Take 1 capsule (100 mg total) by mouth 2 (two) times daily. 01/18/22  Yes Maddilyn Campus, Denny PeonErin K, PA-C  mupirocin ointment (BACTROBAN) 2 % Apply 1 Application topically 2 (two) times daily. 01/18/22  Yes Loretto Belinsky K, PA-C  albuterol (VENTOLIN HFA) 108 (90 Base) MCG/ACT inhaler Inhale 2 puffs into the lungs every 6 (six) hours as needed for wheezing or shortness of breath. 05/30/18   Duanne LimerickJones, Deanna C, MD  atorvastatin (LIPITOR) 10 MG tablet Take 1 tablet (10 mg total) by mouth daily. 10/05/21   Duanne LimerickJones, Deanna C, MD  B Complex Vitamins (B COMPLEX PO) Take by mouth daily.    [provider]  Biotin 10 MG TABS Take 1 tablet by  mouth daily.    [provider]  carbamide peroxide (DEBROX) 6.5 % OTIC solution Place 5 drops into both ears 2 (two) times daily. 09/24/20   Duanne Limerick, MD  cephALEXin (KEFLEX) 500 MG capsule Take 1 capsule (500 mg total) by mouth 3 (three) times daily. 01/14/22   Duanne Limerick, MD  cholecalciferol (VITAMIN D) 1000 UNITS tablet Take 1,000 Units by mouth daily.    [provider]  fluticasone furoate-vilanterol  (BREO ELLIPTA) 100-25 MCG/INH AEPB Inhale 1 puff into the lungs in the morning. 02/04/20   Duanne Limerick, MD  hydrochlorothiazide (HYDRODIURIL) 12.5 MG tablet Take 1 tablet (12.5 mg total) by mouth daily. 10/05/21   Duanne Limerick, MD  metoprolol succinate (TOPROL-XL) 25 MG 24 hr tablet Take 1 tablet (25 mg total) by mouth daily. 10/05/21   Duanne Limerick, MD  Multiple Vitamins-Minerals (MULTIVITAMIN WOMEN 50+ PO) Take 1 tablet by mouth daily.    [provider]  Omega-3 Fatty Acids (FISH OIL) 1000 MG CAPS Take 1 capsule (1,000 mg total) by mouth daily. 04/08/20   Duanne Limerick, MD  oxybutynin (DITROPAN-XL) 5 MG 24 hr tablet Take 1 tablet (5 mg total) by mouth at bedtime. 10/05/21   Duanne Limerick, MD  pantoprazole (PROTONIX) 40 MG tablet Take 1 tablet (40 mg total) by mouth 2 (two) times daily before a meal. 10/05/21 10/05/22  Duanne Limerick, MD  thiamine 100 MG tablet Take 1 tablet (100 mg total) by mouth daily. 06/09/20   Rodolph Bong, MD  traZODone (DESYREL) 50 MG tablet Take 0.5-1 tablets (25-50 mg total) by mouth at bedtime. 10/05/21   Duanne Limerick, MD  triamcinolone ointment (KENALOG) 0.5 % Apply 1 application topically 2 (two) times daily. 05/30/18   Duanne Limerick, MD  vitamin B-12 (CYANOCOBALAMIN) 1000 MCG tablet Take 1 tablet (1,000 mcg total) by mouth daily. 06/08/20   Rodolph Bong, MD  vitamin C (ASCORBIC ACID) 250 MG tablet Take 250 mg by mouth daily.    [provider]    Family History Family History  Problem Relation Age of Onset   Diabetes Mother    Breast cancer Neg Hx     Social History Social History   Tobacco Use   Smoking status: Former    Packs/day: 0.00    Types: Cigarettes    Quit date: 06/09/2020    Years since quitting: 1.6   Smokeless tobacco: Never   Tobacco comments:    gave info on patches and pills  Vaping Use   Vaping Use: Never used  Substance Use Topics   Alcohol use: Not Currently    Alcohol/week: 3.0 standard drinks  of alcohol    Types: 3 Glasses of wine per week   Drug use: No     Allergies   Patient has no known allergies.   Review of Systems Review of Systems  Constitutional:  Positive for activity change. Negative for appetite change, fatigue and fever.  Gastrointestinal:  Negative for abdominal pain, diarrhea, nausea and vomiting.  Musculoskeletal:  Positive for arthralgias, gait problem and joint swelling. Negative for myalgias.  Skin:  Positive for wound.  Neurological:  Negative for weakness and numbness.     Physical Exam Triage Vital Signs ED Triage Vitals  Enc Vitals Group     BP 01/18/22 0938 (!) 153/80     Pulse Rate 01/18/22 0938 77     Resp 01/18/22 0938 16     Temp  01/18/22 0938 98.4 F (36.9 C)     Temp Source 01/18/22 0938 Oral     SpO2 01/18/22 0938 98 %     Weight --      Height --      Head Circumference --      Peak Flow --      Pain Score 01/18/22 0937 8     Pain Loc --      Pain Edu? --      Excl. in GC? --    No data found.  Updated Vital Signs BP (!) 153/80 (BP Location: Left Arm)   Pulse 69   Temp 98.4 F (36.9 C) (Oral)   Resp 20   SpO2 100%   Visual Acuity Right Eye Distance:   Left Eye Distance:   Bilateral Distance:    Right Eye Near:   Left Eye Near:    Bilateral Near:     Physical Exam Vitals reviewed.  Constitutional:      General: She is awake. She is not in acute distress.    Appearance: Normal appearance. She is well-developed. She is not ill-appearing.     Comments: Very pleasant female appears stated age in no acute distress sitting comfortably in exam room  HENT:     Head: Normocephalic and atraumatic.  Cardiovascular:     Rate and Rhythm: Normal rate and regular rhythm.     Heart sounds: Normal heart sounds, S1 normal and S2 normal. No murmur heard. Pulmonary:     Effort: Pulmonary effort is normal.     Breath sounds: Normal breath sounds. No wheezing, rhonchi or rales.     Comments: Clear to auscultation  bilaterally Musculoskeletal:     Right knee: No swelling or effusion. Decreased range of motion. Tenderness present over the medial joint line and lateral joint line. No LCL laxity, MCL laxity, ACL laxity or PCL laxity.     Instability Tests: Anterior drawer test negative. Posterior drawer test negative.     Comments: Right knee: Tenderness palpation over inferior joint line.  No deformity noted.  Normal active range of motion.  No ligamentous laxity on exam.  Psychiatric:        Behavior: Behavior is cooperative.       UC Treatments / Results  Labs (all labs ordered are listed, but only abnormal results are displayed) Labs Reviewed  BASIC METABOLIC PANEL - Abnormal; Notable for the following components:      Result Value   Glucose, Bld 104 (*)    Creatinine, Ser 1.05 (*)    GFR, Estimated 60 (*)    All other components within normal limits  CBC WITH DIFFERENTIAL/PLATELET    EKG   Radiology DG Knee Complete 4 Views Right  Result Date: 01/18/2022 CLINICAL DATA:  61 year old female status post fall on concrete step. Open wound. EXAM: RIGHT KNEE - COMPLETE 4+ VIEW COMPARISON:  None Available. FINDINGS: Weightbearing views. Bone mineralization is within normal limits. No evidence of joint effusion. Maintained joint spaces and alignment. Anterior soft tissue swelling including at the patella. No radiopaque foreign body identified. No definite tracking soft tissue gas. No osseous abnormality identified. IMPRESSION: Anterior soft tissue injury. No radiopaque foreign body or osseous abnormality identified about the right knee. Electronically Signed   By: Odessa Fleming M.D.   On: 01/18/2022 11:49    Procedures Procedures (including critical care time)  Medications Ordered in UC Medications  Tdap (BOOSTRIX) injection 0.5 mL (0.5 mLs Intramuscular Given 01/18/22 1207)  Initial Impression / Assessment and Plan / UC Course  I have reviewed the triage vital signs and the nursing  notes.  Pertinent labs & imaging results that were available during my care of the patient were reviewed by me and considered in my medical decision making (see chart for details).     Patient is well-appearing, afebrile, nontoxic, nontachycardic.  X-ray was obtained that showed no retained foreign body or osseous abnormality.  Given significant drainage that had slight odor will cover for infection.  Patient was started on doxycycline 100 mg twice daily for 10 days.  Given proximity to joint basic blood work was obtained with normal evidence of significant leukocytosis and normal kidney function.  She was encouraged to keep the area clean with soap and water and apply mupirocin twice daily.  Recommended RICE medical as well as alternating over-the-counter analgesics for pain.  Encouraged her to follow-up with either our clinic or primary care within a few days to ensure appropriate healing.  Discussed that if she has any worsening or changing symptoms including increased pain, fever, change in drainage, nausea, vomiting she needs to be seen immediately.  Strict return precautions given to which she expressed understanding.  Work excuse note provided.  Final Clinical Impressions(s) / UC Diagnoses   Final diagnoses:  Abrasion of right knee, initial encounter  Injury of right knee, initial encounter  Fall, initial encounter     Discharge Instructions      Your x-ray was normal with no evidence of a fracture or foreign body.  We cleaned this area in clinic.  Keep it clean with soap and water twice daily and apply mupirocin ointment.  Start doxycycline 100 mg twice daily for 10 days.  Stay out of the sun while on this medication.  We updated your tetanus today.  Keep your leg elevated and use ice and compression.  You can alternate Tylenol ibuprofen for pain.  If you develop any increased pain, fever, change in drainage, nausea/vomiting you need to be seen immediately.     ED Prescriptions      Medication Sig Dispense Auth. Provider   mupirocin ointment (BACTROBAN) 2 % Apply 1 Application topically 2 (two) times daily. 22 g Hever Castilleja K, PA-C   doxycycline (VIBRAMYCIN) 100 MG capsule Take 1 capsule (100 mg total) by mouth 2 (two) times daily. 20 capsule Monic Engelmann, Noberto Retort, PA-C      PDMP not reviewed this encounter.   Jeani Hawking, PA-C 01/18/22 1231

## 2022-01-18 NOTE — ED Triage Notes (Signed)
Pt fell at home on the corner of a concrete step and injured her right knee. She has an open wound and a scrap.

## 2022-01-18 NOTE — Discharge Instructions (Signed)
Your x-ray was normal with no evidence of a fracture or foreign body.  We cleaned this area in clinic.  Keep it clean with soap and water twice daily and apply mupirocin ointment.  Start doxycycline 100 mg twice daily for 10 days.  Stay out of the sun while on this medication.  We updated your tetanus today.  Keep your leg elevated and use ice and compression.  You can alternate Tylenol ibuprofen for pain.  If you develop any increased pain, fever, change in drainage, nausea/vomiting you need to be seen immediately.

## 2022-01-28 ENCOUNTER — Ambulatory Visit: Payer: Self-pay

## 2022-01-28 ENCOUNTER — Encounter: Payer: Self-pay | Admitting: Family Medicine

## 2022-01-28 ENCOUNTER — Ambulatory Visit: Payer: BC Managed Care – PPO | Admitting: Family Medicine

## 2022-01-28 VITALS — BP 100/60 | HR 98 | Temp 98.8°F | Ht 61.0 in | Wt 118.0 lb

## 2022-01-28 DIAGNOSIS — R509 Fever, unspecified: Secondary | ICD-10-CM | POA: Diagnosis not present

## 2022-01-28 DIAGNOSIS — R6889 Other general symptoms and signs: Secondary | ICD-10-CM | POA: Diagnosis not present

## 2022-01-28 DIAGNOSIS — R051 Acute cough: Secondary | ICD-10-CM

## 2022-01-28 DIAGNOSIS — J101 Influenza due to other identified influenza virus with other respiratory manifestations: Secondary | ICD-10-CM

## 2022-01-28 LAB — POCT INFLUENZA A/B
Influenza A, POC: POSITIVE — AB
Influenza B, POC: NEGATIVE

## 2022-01-28 MED ORDER — GUAIFENESIN-CODEINE 100-10 MG/5ML PO SYRP: 5.0000 mL | ORAL_SOLUTION | Freq: Three times a day (TID) | ORAL | 0 refills | Status: DC | PRN

## 2022-01-28 MED ORDER — OSELTAMIVIR PHOSPHATE 75 MG PO CAPS: 75.0000 mg | ORAL_CAPSULE | Freq: Two times a day (BID) | ORAL | 0 refills | Status: DC

## 2022-01-28 NOTE — Progress Notes (Signed)
Date:  01/28/2022   Name:  Samantha Irwin [facility]   DOB:  08-Jan-1961   MRN:  [id number]   Chief Complaint: Fever (Started Tuesday with weakness, fever, headache on R) side in back of head)  Fever  This is a new problem. The current episode started in the past 7 days (Thursday). The problem occurs intermittently. The problem has been waxing and waning. Associated symptoms include congestion, coughing, headaches, muscle aches, a sore throat and wheezing. She has tried acetaminophen and fluids for the symptoms. The treatment provided mild relief.    Lab Results  Component Value Date   NA 137 01/18/2022   K 4.3 01/18/2022   CO2 27 01/18/2022   GLUCOSE 104 (H) 01/18/2022   BUN 18 01/18/2022   CREATININE 1.05 (H) 01/18/2022   CALCIUM 9.3 01/18/2022   EGFR 59 (L) 09/24/2021   GFRNONAA 60 (L) 01/18/2022   Lab Results  Component Value Date   CHOL 217 (H) 09/24/2021   HDL 92 09/24/2021   LDLCALC 107 (H) 09/24/2021   TRIG 104 09/24/2021   CHOLHDL 3.2 05/30/2018   Lab Results  Component Value Date   TSH 0.868 02/17/2021   Lab Results  Component Value Date   HGBA1C 5.2 09/13/2016   Lab Results  Component Value Date   WBC 5.1 01/18/2022   HGB 12.0 01/18/2022   HCT 36.4 01/18/2022   MCV 91.2 01/18/2022   PLT 280 01/18/2022   Lab Results  Component Value Date   ALT 20 02/17/2021   AST 25 02/17/2021   ALKPHOS 91 02/17/2021   BILITOT 0.3 02/17/2021   No results found for: "25OHVITD2", "25OHVITD3", "VD25OH"   Review of Systems  Constitutional:  Positive for fever.  HENT:  Positive for congestion, postnasal drip, rhinorrhea, sinus pressure, sneezing and sore throat. Negative for sinus pain.   Respiratory:  Positive for cough and wheezing. Negative for chest tightness and shortness of breath.   Neurological:  Positive for headaches.    Patient Active Problem List   Diagnosis Date Noted   Overactive bladder 05/30/2018   Chronic obstructive pulmonary disease ([place])  05/30/2018   Familial hypercholesterolemia 05/30/2018   Flexural eczema 05/30/2018   Cigarette nicotine dependence without complication 04/88/8916   Atherosclerosis of aorta ([place]) 05/12/2017   Essential hypertension 06/12/2015    No Known Allergies  Past Surgical History:  Procedure Laterality Date   CESAREAN SECTION     COLONOSCOPY  2012   cleared for 5 yrs- [place]   COLONOSCOPY WITH PROPOFOL N/A 11/02/2016   Procedure: COLONOSCOPY WITH PROPOFOL;  Surgeon: Lin Landsman, MD;  Location: [place];  Service: Gastroenterology;  Laterality: N/A;   ERCP N/A 06/05/2020   Procedure: ENDOSCOPIC RETROGRADE CHOLANGIOPANCREATOGRAPHY (ERCP);  Surgeon: Lucilla Lame, MD;  [facility] ENDOSCOPY;  Service: Endoscopy;  Laterality: N/A;   ESOPHAGOGASTRODUODENOSCOPY (EGD) WITH PROPOFOL N/A 06/05/2020   Procedure: ESOPHAGOGASTRODUODENOSCOPY (EGD) WITH PROPOFOL;  Surgeon: Lucilla Lame, MD;  Location: [facility] ENDOSCOPY;  Service: Endoscopy;  Laterality: N/A;    Social History   Tobacco Use   Smoking status: Former    Packs/day: 0.00    Types: Cigarettes    Quit date: 06/09/2020    Years since quitting: 1.6   Smokeless tobacco: Never   Tobacco comments:    gave info on patches and pills  Vaping Use   Vaping Use: Never used  Substance Use Topics   Alcohol use: Not Currently    Alcohol/week: 3.0 standard drinks of alcohol  Types: 3 Glasses of wine per week   Drug use: No     Medication list has been reviewed and updated.  Current Meds  Medication Sig   albuterol (VENTOLIN HFA) 108 (90 Base) MCG/ACT inhaler Inhale 2 puffs into the lungs every 6 (six) hours as needed for wheezing or shortness of breath.   atorvastatin (LIPITOR) 10 MG tablet Take 1 tablet (10 mg total) by mouth daily.   B Complex Vitamins (B COMPLEX PO) Take by mouth daily.   Biotin 10 MG TABS Take 1 tablet by mouth daily.   carbamide peroxide (DEBROX) 6.5 % OTIC solution Place 5 drops into both ears 2 (two)  times daily.   cholecalciferol (VITAMIN D) 1000 UNITS tablet Take 1,000 Units by mouth daily.   doxycycline (VIBRAMYCIN) 100 MG capsule Take 1 capsule (100 mg total) by mouth 2 (two) times daily.   fluticasone furoate-vilanterol (BREO ELLIPTA) 100-25 MCG/INH AEPB Inhale 1 puff into the lungs in the morning.   hydrochlorothiazide (HYDRODIURIL) 12.5 MG tablet Take 1 tablet (12.5 mg total) by mouth daily.   metoprolol succinate (TOPROL-XL) 25 MG 24 hr tablet Take 1 tablet (25 mg total) by mouth daily.   Multiple Vitamins-Minerals (MULTIVITAMIN WOMEN 50+ PO) Take 1 tablet by mouth daily.   mupirocin ointment (BACTROBAN) 2 % Apply 1 Application topically 2 (two) times daily.   Omega-3 Fatty Acids (FISH OIL) 1000 MG CAPS Take 1 capsule (1,000 mg total) by mouth daily.   oxybutynin (DITROPAN-XL) 5 MG 24 hr tablet Take 1 tablet (5 mg total) by mouth at bedtime.   pantoprazole (PROTONIX) 40 MG tablet Take 1 tablet (40 mg total) by mouth 2 (two) times daily before a meal.   thiamine 100 MG tablet Take 1 tablet (100 mg total) by mouth daily.   traZODone (DESYREL) 50 MG tablet Take 0.5-1 tablets (25-50 mg total) by mouth at bedtime.   triamcinolone ointment (KENALOG) 0.5 % Apply 1 application topically 2 (two) times daily.   vitamin B-12 (CYANOCOBALAMIN) 1000 MCG tablet Take 1 tablet (1,000 mcg total) by mouth daily.   vitamin C (ASCORBIC ACID) 250 MG tablet Take 250 mg by mouth daily.       01/28/2022   11:11 AM 01/14/2022    3:57 PM 11/24/2021    2:36 PM 09/24/2021   10:13 AM  GAD 7 : Generalized Anxiety Score  Nervous, Anxious, on Edge 0 0 0 0  Control/stop worrying 0 0 0 0  Worry too much - different things 0 0 0 1  Trouble relaxing 0 0 0 1  Restless 0 0 0 0  Easily annoyed or irritable 0 0 0 0  Afraid - awful might happen 0 0 0 0  Total GAD 7 Score 0 0 0 2  Anxiety Difficulty Not difficult at all Not difficult at all Not difficult at all Not difficult at all       01/28/2022   11:11 AM  01/14/2022    3:57 PM 11/24/2021    2:36 PM  Depression screen PHQ 2/9  Decreased Interest 0 0 0  Down, Depressed, Hopeless 0 0 0  PHQ - 2 Score 0 0 0  Altered sleeping 0 0 0  Tired, decreased energy 0 0 0  Change in appetite 0 0 0  Feeling bad or failure about yourself  0 0 0  Trouble concentrating 0 0 0  Moving slowly or fidgety/restless 0 0 0  Suicidal thoughts 0 0 0  PHQ-9 Score 0 0 0  Difficult doing work/chores Not difficult at all Not difficult at all Not difficult at all    BP Readings from Last 3 Encounters:  01/28/22 100/60  01/18/22 (!) 153/80  01/14/22 (!) 148/100    Physical Exam Vitals and nursing note reviewed. Exam conducted with a chaperone present.  Constitutional:      General: She is not in acute distress.    Appearance: She is not diaphoretic.  HENT:     Head: Normocephalic and atraumatic.     Right Ear: Tympanic membrane and external ear normal.     Left Ear: Tympanic membrane and external ear normal.     Nose: Nose normal.     Mouth/Throat:     Mouth: Mucous membranes are moist.  Eyes:     General:        Right eye: No discharge.        Left eye: No discharge.     Conjunctiva/sclera: Conjunctivae normal.     Pupils: Pupils are equal, round, and reactive to light.  Neck:     Thyroid: No thyromegaly.     Vascular: No JVD.  Cardiovascular:     Rate and Rhythm: Normal rate and regular rhythm.     Heart sounds: Normal heart sounds. No murmur heard.    No friction rub. No gallop.  Pulmonary:     Effort: Pulmonary effort is normal.     Breath sounds: Normal breath sounds.  Abdominal:     General: Bowel sounds are normal.     Palpations: Abdomen is soft. There is no mass.     Tenderness: There is no abdominal tenderness. There is no guarding.  Musculoskeletal:        General: Normal range of motion.     Cervical back: Normal range of motion and neck supple.  Lymphadenopathy:     Cervical: No cervical adenopathy.  Skin:    General: Skin is  warm and dry.  Neurological:     Mental Status: She is alert.     Deep Tendon Reflexes: Reflexes are normal and symmetric.     Wt Readings from Last 3 Encounters:  01/28/22 118 lb (53.5 kg)  01/14/22 124 lb (56.2 kg)  11/24/21 124 lb (56.2 kg)    BP 100/60   Pulse 98   Temp 98.8 F (37.1 C) (Oral)   Ht _0  (1.549 m)   Wt 118 lb (53.5 kg)   SpO2 98%   BMI 22.30 kg/m   Assessment and Plan:     Otilio Miu, MD

## 2022-01-28 NOTE — Progress Notes (Signed)
Date:  01/28/2022   Name:  Samantha Irwin [facility]   DOB:  November 29, 1960   MRN:  [id number]   Chief Complaint: Fever (Started Tuesday with weakness, fever, headache on R) side in back of head)  Fever     Lab Results  Component Value Date   NA 137 01/18/2022   K 4.3 01/18/2022   CO2 27 01/18/2022   GLUCOSE 104 (H) 01/18/2022   BUN 18 01/18/2022   CREATININE 1.05 (H) 01/18/2022   CALCIUM 9.3 01/18/2022   EGFR 59 (L) 09/24/2021   GFRNONAA 60 (L) 01/18/2022   Lab Results  Component Value Date   CHOL 217 (H) 09/24/2021   HDL 92 09/24/2021   LDLCALC 107 (H) 09/24/2021   TRIG 104 09/24/2021   CHOLHDL 3.2 05/30/2018   Lab Results  Component Value Date   TSH 0.868 02/17/2021   Lab Results  Component Value Date   HGBA1C 5.2 09/13/2016   Lab Results  Component Value Date   WBC 5.1 01/18/2022   HGB 12.0 01/18/2022   HCT 36.4 01/18/2022   MCV 91.2 01/18/2022   PLT 280 01/18/2022   Lab Results  Component Value Date   ALT 20 02/17/2021   AST 25 02/17/2021   ALKPHOS 91 02/17/2021   BILITOT 0.3 02/17/2021   No results found for: "25OHVITD2", "25OHVITD3", "VD25OH"   Review of Systems  Constitutional:  Positive for fever.    Patient Active Problem List   Diagnosis Date Noted   Overactive bladder 05/30/2018   Chronic obstructive pulmonary disease ([place]) 05/30/2018   Familial hypercholesterolemia 05/30/2018   Flexural eczema 05/30/2018   Cigarette nicotine dependence without complication 94/50/3888   Atherosclerosis of aorta ([place]) 05/12/2017   Essential hypertension 06/12/2015    No Known Allergies  Past Surgical History:  Procedure Laterality Date   CESAREAN SECTION     COLONOSCOPY  2012   cleared for 5 yrs- [place]   COLONOSCOPY WITH PROPOFOL N/A 11/02/2016   Procedure: COLONOSCOPY WITH PROPOFOL;  Surgeon: Lin Landsman, MD;  Location: [place];  Service: Gastroenterology;  Laterality: N/A;   ERCP N/A 06/05/2020   Procedure: ENDOSCOPIC  RETROGRADE CHOLANGIOPANCREATOGRAPHY (ERCP);  Surgeon: Lucilla Lame, MD;  Location: [facility] ENDOSCOPY;  Service: Endoscopy;  Laterality: N/A;   ESOPHAGOGASTRODUODENOSCOPY (EGD) WITH PROPOFOL N/A 06/05/2020   Procedure: ESOPHAGOGASTRODUODENOSCOPY (EGD) WITH PROPOFOL;  Surgeon: Lucilla Lame, MD;  Location: [facility] ENDOSCOPY;  Service: Endoscopy;  Laterality: N/A;    Social History   Tobacco Use   Smoking status: Former    Packs/day: 0.00    Types: Cigarettes    Quit date: 06/09/2020    Years since quitting: 1.6   Smokeless tobacco: Never   Tobacco comments:    gave info on patches and pills  Vaping Use   Vaping Use: Never used  Substance Use Topics   Alcohol use: Not Currently    Alcohol/week: 3.0 standard drinks of alcohol    Types: 3 Glasses of wine per week   Drug use: No     Medication list has been reviewed and updated.  Current Meds  Medication Sig   albuterol (VENTOLIN HFA) 108 (90 Base) MCG/ACT inhaler Inhale 2 puffs into the lungs every 6 (six) hours as needed for wheezing or shortness of breath.   atorvastatin (LIPITOR) 10 MG tablet Take 1 tablet (10 mg total) by mouth daily.   B Complex Vitamins (B COMPLEX PO) Take by mouth daily.   Biotin 10 MG TABS Take 1 tablet by mouth  daily.   carbamide peroxide (DEBROX) 6.5 % OTIC solution Place 5 drops into both ears 2 (two) times daily.   cholecalciferol (VITAMIN D) 1000 UNITS tablet Take 1,000 Units by mouth daily.   doxycycline (VIBRAMYCIN) 100 MG capsule Take 1 capsule (100 mg total) by mouth 2 (two) times daily.   fluticasone furoate-vilanterol (BREO ELLIPTA) 100-25 MCG/INH AEPB Inhale 1 puff into the lungs in the morning.   guaiFENesin-codeine (ROBITUSSIN AC) 100-10 MG/5ML syrup Take 5 mLs by mouth 3 (three) times daily as needed for cough.   hydrochlorothiazide (HYDRODIURIL) 12.5 MG tablet Take 1 tablet (12.5 mg total) by mouth daily.   metoprolol succinate (TOPROL-XL) 25 MG 24 hr tablet Take 1 tablet (25 mg total) by mouth daily.    Multiple Vitamins-Minerals (MULTIVITAMIN WOMEN 50+ PO) Take 1 tablet by mouth daily.   mupirocin ointment (BACTROBAN) 2 % Apply 1 Application topically 2 (two) times daily.   Omega-3 Fatty Acids (FISH OIL) 1000 MG CAPS Take 1 capsule (1,000 mg total) by mouth daily.   oseltamivir (TAMIFLU) 75 MG capsule Take 1 capsule (75 mg total) by mouth 2 (two) times daily.   oxybutynin (DITROPAN-XL) 5 MG 24 hr tablet Take 1 tablet (5 mg total) by mouth at bedtime.   pantoprazole (PROTONIX) 40 MG tablet Take 1 tablet (40 mg total) by mouth 2 (two) times daily before a meal.   thiamine 100 MG tablet Take 1 tablet (100 mg total) by mouth daily.   traZODone (DESYREL) 50 MG tablet Take 0.5-1 tablets (25-50 mg total) by mouth at bedtime.   triamcinolone ointment (KENALOG) 0.5 % Apply 1 application topically 2 (two) times daily.   vitamin B-12 (CYANOCOBALAMIN) 1000 MCG tablet Take 1 tablet (1,000 mcg total) by mouth daily.   vitamin C (ASCORBIC ACID) 250 MG tablet Take 250 mg by mouth daily.       01/28/2022   11:11 AM 01/14/2022    3:57 PM 11/24/2021    2:36 PM 09/24/2021   10:13 AM  GAD 7 : Generalized Anxiety Score  Nervous, Anxious, on Edge 0 0 0 0  Control/stop worrying 0 0 0 0  Worry too much - different things 0 0 0 1  Trouble relaxing 0 0 0 1  Restless 0 0 0 0  Easily annoyed or irritable 0 0 0 0  Afraid - awful might happen 0 0 0 0  Total GAD 7 Score 0 0 0 2  Anxiety Difficulty Not difficult at all Not difficult at all Not difficult at all Not difficult at all       01/28/2022   11:11 AM 01/14/2022    3:57 PM 11/24/2021    2:36 PM  Depression screen PHQ 2/9  Decreased Interest 0 0 0  Down, Depressed, Hopeless 0 0 0  PHQ - 2 Score 0 0 0  Altered sleeping 0 0 0  Tired, decreased energy 0 0 0  Change in appetite 0 0 0  Feeling bad or failure about yourself  0 0 0  Trouble concentrating 0 0 0  Moving slowly or fidgety/restless 0 0 0  Suicidal thoughts 0 0 0  PHQ-9 Score 0 0 0   Difficult doing work/chores Not difficult at all Not difficult at all Not difficult at all    BP Readings from Last 3 Encounters:  01/28/22 100/60  01/18/22 (!) 153/80  01/14/22 (!) 148/100    Physical Exam Vitals and nursing note reviewed.  HENT:     Head: Normocephalic.  Right Ear: Tympanic membrane and ear canal normal.     Left Ear: Tympanic membrane and ear canal normal.     Nose: Nose normal.     Mouth/Throat:     Mouth: Mucous membranes are moist.  Neck:     Comments: Tender right posterior trapezius Cardiovascular:     Heart sounds: No murmur heard.    No friction rub. No gallop.  Pulmonary:     Breath sounds: No wheezing, rhonchi or rales.  Chest:     Chest wall: No tenderness.  Musculoskeletal:     Cervical back: Neck supple. Tenderness present.  Neurological:     Mental Status: She is alert.     Wt Readings from Last 3 Encounters:  01/28/22 118 lb (53.5 kg)  01/14/22 124 lb (56.2 kg)  11/24/21 124 lb (56.2 kg)    BP 100/60   Pulse 98   Temp 98.8 F (37.1 C) (Oral)   Ht _0  (1.549 m)   Wt 118 lb (53.5 kg)   SpO2 98%   BMI 22.30 kg/m   Assessment and Plan:  1. Flu-like symptoms Patient with flulike symptoms of fever chills myalgias over the the past 48 hours.  Patient has had exposure to children with upper respiratory symptoms.  2. Influenza A New onset.  Persistent.  Patient test positive for influenza A.  We will initiate Tamiflu 75 mg twice a day. - oseltamivir (TAMIFLU) 75 MG capsule; Take 1 capsule (75 mg total) by mouth 2 (two) times daily.  Dispense: 10 capsule; Refill: 0  3. Fever and chills Fevers or chills as noted above - POCT Influenza A/B  4. Acute cough New onset.  Persistent.  Nonproductive.  Bothersome at night and needs control during the day.  Will initiate Robitussin AC a teaspoon every 8 hours as needed for cough for 5 days. - guaiFENesin-codeine (ROBITUSSIN AC) 100-10 MG/5ML syrup; Take 5 mLs by mouth 3 (three)  times daily as needed for cough.  Dispense: 118 mL; Refill: 0    Otilio Miu, MD

## 2022-01-28 NOTE — Telephone Encounter (Signed)
      Chief Complaint: Cough,body aches,sore throat Symptoms: Above Frequency: Yesterday Pertinent Negatives: Patient denies SOB Disposition: [] ED /[] Urgent Care (no appt availability in office) / [x] Appointment(In office/virtual)/ []  [place] [facility]/ [] Home Care/ [] Refused Recommended Disposition /[] [place] [facility]/ []  Follow-up with PCP Additional Notes:   Reason for Disposition  [1] Fever > 101 F (38.3 C) AND [2] age > 60 years  Answer Assessment - Initial Assessment Questions 1. ONSET: "When did the cough begin?"      Yesterday 2. SEVERITY: "How bad is the cough today?"      Severe 3. SPUTUM: "Describe the color of your sputum" (none, dry cough; clear, white, yellow, green)     None 4. HEMOPTYSIS: "Are you coughing up any blood?" If so ask: "How much?" (flecks, streaks, tablespoons, etc.)     No 5. DIFFICULTY BREATHING: "Are you having difficulty breathing?" If Yes, ask: "How bad is it?" (e.g., mild, moderate, severe)    - MILD: No SOB at rest, mild SOB with walking, speaks normally in sentences, can lie down, no retractions, pulse < 100.    - MODERATE: SOB at rest, SOB with minimal exertion and prefers to sit, cannot lie down flat, speaks in phrases, mild retractions, audible wheezing, pulse 100-120.    - SEVERE: Very SOB at rest, speaks in single words, struggling to breathe, sitting hunched forward, retractions, pulse > 120      No 6. FEVER: "Do you have a fever?" If Yes, ask: "What is your temperature, how was it measured, and when did it start?"     Yes 7. CARDIAC HISTORY: "Do you have any history of heart disease?" (e.g., heart attack, congestive heart failure)      No 8. LUNG HISTORY: "Do you have any history of lung disease?"  (e.g., pulmonary embolus, asthma, emphysema)     No 9. PE RISK FACTORS: "Do you have a history of blood clots?" (or: recent major surgery, recent prolonged travel, bedridden)     No 10. OTHER SYMPTOMS: "Do you have any other  symptoms?" (e.g., runny nose, wheezing, chest pain)       Sore throat, body aches 11. PREGNANCY: "Is there any chance you are pregnant?" "When was your last menstrual period?"       No 12. TRAVEL: "Have you traveled out of the country in the last month?" (e.g., travel history, exposures)       No  Protocols used: Cough - Acute Non-Productive-A-AH

## 2022-02-19 ENCOUNTER — Ambulatory Visit: Payer: BC Managed Care – PPO | Admitting: Family Medicine

## 2022-02-19 ENCOUNTER — Encounter: Payer: Self-pay | Admitting: Family Medicine

## 2022-02-19 VITALS — BP 132/86 | HR 68 | Ht 61.5 in | Wt 124.0 lb

## 2022-02-19 DIAGNOSIS — R0789 Other chest pain: Secondary | ICD-10-CM

## 2022-02-19 DIAGNOSIS — S29011A Strain of muscle and tendon of front wall of thorax, initial encounter: Secondary | ICD-10-CM | POA: Diagnosis not present

## 2022-02-19 NOTE — Progress Notes (Signed)
Date:  02/19/2022   Name:  Samantha Irwin [facility]   DOB:  1960-07-15   MRN:  [id number]   Chief Complaint: Breast Pain (Left breast bottom bra line, for 1 week)  Chest Pain  This is a new problem. The current episode started in the past 7 days. The onset quality is sudden. The problem has been waxing and waning. The pain is present in the lateral region. The pain is moderate. The quality of the pain is described as sharp. The pain does not radiate. Associated symptoms include a cough. Pertinent negatives include no abdominal pain, fever, irregular heartbeat, palpitations or shortness of breath.    Lab Results  Component Value Date   NA 137 01/18/2022   K 4.3 01/18/2022   CO2 27 01/18/2022   GLUCOSE 104 (H) 01/18/2022   BUN 18 01/18/2022   CREATININE 1.05 (H) 01/18/2022   CALCIUM 9.3 01/18/2022   EGFR 59 (L) 09/24/2021   GFRNONAA 60 (L) 01/18/2022   Lab Results  Component Value Date   CHOL 217 (H) 09/24/2021   HDL 92 09/24/2021   LDLCALC 107 (H) 09/24/2021   TRIG 104 09/24/2021   CHOLHDL 3.2 05/30/2018   Lab Results  Component Value Date   TSH 0.868 02/17/2021   Lab Results  Component Value Date   HGBA1C 5.2 09/13/2016   Lab Results  Component Value Date   WBC 5.1 01/18/2022   HGB 12.0 01/18/2022   HCT 36.4 01/18/2022   MCV 91.2 01/18/2022   PLT 280 01/18/2022   Lab Results  Component Value Date   ALT 20 02/17/2021   AST 25 02/17/2021   ALKPHOS 91 02/17/2021   BILITOT 0.3 02/17/2021   No results found for: "25OHVITD2", "25OHVITD3", "VD25OH"   Review of Systems  Constitutional:  Negative for chills and fever.  HENT:  Negative for postnasal drip.   Respiratory:  Positive for cough. Negative for chest tightness, shortness of breath and wheezing.   Cardiovascular:  Positive for chest pain. Negative for palpitations and leg swelling.  Gastrointestinal:  Negative for abdominal pain.    Patient Active Problem List   Diagnosis Date Noted   Overactive bladder  05/30/2018   Chronic obstructive pulmonary disease ([place]) 05/30/2018   Familial hypercholesterolemia 05/30/2018   Flexural eczema 05/30/2018   Cigarette nicotine dependence without complication 38/25/0539   Atherosclerosis of aorta ([place]) 05/12/2017   Essential hypertension 06/12/2015    No Known Allergies  Past Surgical History:  Procedure Laterality Date   CESAREAN SECTION     COLONOSCOPY  2012   cleared for 5 yrs- [place]   COLONOSCOPY WITH PROPOFOL N/A 11/02/2016   Procedure: COLONOSCOPY WITH PROPOFOL;  Surgeon: Lin Landsman, MD;  Location: [place];  Service: Gastroenterology;  Laterality: N/A;   ERCP N/A 06/05/2020   Procedure: ENDOSCOPIC RETROGRADE CHOLANGIOPANCREATOGRAPHY (ERCP);  Surgeon: Lucilla Lame, MD;  Location: [facility] ENDOSCOPY;  Service: Endoscopy;  Laterality: N/A;   ESOPHAGOGASTRODUODENOSCOPY (EGD) WITH PROPOFOL N/A 06/05/2020   Procedure: ESOPHAGOGASTRODUODENOSCOPY (EGD) WITH PROPOFOL;  Surgeon: Lucilla Lame, MD;  Location: [facility] ENDOSCOPY;  Service: Endoscopy;  Laterality: N/A;    Social History   Tobacco Use   Smoking status: Former    Packs/day: 0.00    Types: Cigarettes    Quit date: 06/09/2020    Years since quitting: 1.6   Smokeless tobacco: Never   Tobacco comments:    gave info on patches and pills  Vaping Use   Vaping Use: Never used  Substance Use Topics  Alcohol use: Not Currently    Alcohol/week: 3.0 standard drinks of alcohol    Types: 3 Glasses of wine per week   Drug use: No     Medication list has been reviewed and updated.  Current Meds  Medication Sig   albuterol (VENTOLIN HFA) 108 (90 Base) MCG/ACT inhaler Inhale 2 puffs into the lungs every 6 (six) hours as needed for wheezing or shortness of breath.   atorvastatin (LIPITOR) 10 MG tablet Take 1 tablet (10 mg total) by mouth daily.   B Complex Vitamins (B COMPLEX PO) Take by mouth daily.   Biotin 10 MG TABS Take 1 tablet by mouth daily.   carbamide peroxide (DEBROX)  6.5 % OTIC solution Place 5 drops into both ears 2 (two) times daily.   cholecalciferol (VITAMIN D) 1000 UNITS tablet Take 1,000 Units by mouth daily.   doxycycline (VIBRAMYCIN) 100 MG capsule Take 1 capsule (100 mg total) by mouth 2 (two) times daily.   fluticasone furoate-vilanterol (BREO ELLIPTA) 100-25 MCG/INH AEPB Inhale 1 puff into the lungs in the morning.   guaiFENesin-codeine (ROBITUSSIN AC) 100-10 MG/5ML syrup Take 5 mLs by mouth 3 (three) times daily as needed for cough.   hydrochlorothiazide (HYDRODIURIL) 12.5 MG tablet Take 1 tablet (12.5 mg total) by mouth daily.   metoprolol succinate (TOPROL-XL) 25 MG 24 hr tablet Take 1 tablet (25 mg total) by mouth daily.   Multiple Vitamins-Minerals (MULTIVITAMIN WOMEN 50+ PO) Take 1 tablet by mouth daily.   mupirocin ointment (BACTROBAN) 2 % Apply 1 Application topically 2 (two) times daily.   Omega-3 Fatty Acids (FISH OIL) 1000 MG CAPS Take 1 capsule (1,000 mg total) by mouth daily.   oseltamivir (TAMIFLU) 75 MG capsule Take 1 capsule (75 mg total) by mouth 2 (two) times daily.   oxybutynin (DITROPAN-XL) 5 MG 24 hr tablet Take 1 tablet (5 mg total) by mouth at bedtime.   pantoprazole (PROTONIX) 40 MG tablet Take 1 tablet (40 mg total) by mouth 2 (two) times daily before a meal.   thiamine 100 MG tablet Take 1 tablet (100 mg total) by mouth daily.   traZODone (DESYREL) 50 MG tablet Take 0.5-1 tablets (25-50 mg total) by mouth at bedtime.   triamcinolone ointment (KENALOG) 0.5 % Apply 1 application topically 2 (two) times daily.   vitamin B-12 (CYANOCOBALAMIN) 1000 MCG tablet Take 1 tablet (1,000 mcg total) by mouth daily.   vitamin C (ASCORBIC ACID) 250 MG tablet Take 250 mg by mouth daily.       02/19/2022    4:22 PM 01/28/2022   11:11 AM 01/14/2022    3:57 PM 11/24/2021    2:36 PM  GAD 7 : Generalized Anxiety Score  Nervous, Anxious, on Edge 0 0 0 0  Control/stop worrying 0 0 0 0  Worry too much - different things 0 0 0 0  Trouble  relaxing 0 0 0 0  Restless 0 0 0 0  Easily annoyed or irritable 0 0 0 0  Afraid - awful might happen 0 0 0 0  Total GAD 7 Score 0 0 0 0  Anxiety Difficulty Not difficult at all Not difficult at all Not difficult at all Not difficult at all       02/19/2022    4:22 PM 01/28/2022   11:11 AM 01/14/2022    3:57 PM  Depression screen PHQ 2/9  Decreased Interest 0 0 0  Down, Depressed, Hopeless 0 0 0  PHQ - 2 Score 0 0 0  Altered sleeping 0 0 0  Tired, decreased energy 0 0 0  Change in appetite 0 0 0  Feeling bad or failure about yourself  0 0 0  Trouble concentrating 0 0 0  Moving slowly or fidgety/restless 0 0 0  Suicidal thoughts 0 0 0  PHQ-9 Score 0 0 0  Difficult doing work/chores Not difficult at all Not difficult at all Not difficult at all    BP Readings from Last 3 Encounters:  02/19/22 132/86  01/28/22 100/60  01/18/22 (!) 153/80    Physical Exam HENT:     Right Ear: Tympanic membrane normal.     Left Ear: Tympanic membrane normal.     Nose: Nose normal.  Cardiovascular:     Heart sounds: No murmur heard.    No friction rub. No gallop.  Pulmonary:     Breath sounds: No wheezing, rhonchi or rales.  Chest:     Chest wall: Tenderness present.  Breasts:    Right: Normal. No swelling, bleeding, inverted nipple, mass, nipple discharge, skin change or tenderness.     Left: Normal. No swelling, bleeding, inverted nipple, mass, nipple discharge, skin change or tenderness.    Neurological:     Mental Status: She is alert.     Wt Readings from Last 3 Encounters:  02/19/22 124 lb (56.2 kg)  01/28/22 118 lb (53.5 kg)  01/14/22 124 lb (56.2 kg)    BP 132/86   Pulse 68   Ht 5' 1.5" (1.562 m)   Wt 124 lb (56.2 kg)   SpO2 98%   BMI 23.05 kg/m   Assessment and Plan:  1. Acute chest wall pain New onset of chest wall pain with no history of trauma.  Pulmonary exam is negative on percussion and auscultation S1-S2 normal without murmurs gallops or rubs there is no  cardiac issue.  Abdominal exam is negative for hepatosplenomegaly.  2. Intercostal muscle strain, initial encounter Tenderness along the ninth and 10th intercostal margin along the ninth rib.  Breath sounds normal this is consistent with a strain of the intercostal muscle with a possibility of a rib fracture.  Patient has been instructed to use a combination of Aleve and Tylenol for pain and for inflammation and to return to clinic if not resolved in 7 to 10 days.   Otilio Miu, MD

## 2022-04-08 ENCOUNTER — Ambulatory Visit: Payer: BC Managed Care – PPO | Admitting: Family Medicine

## 2022-04-14 ENCOUNTER — Encounter: Payer: Self-pay | Admitting: Family Medicine

## 2022-06-30 ENCOUNTER — Telehealth: Payer: Self-pay | Admitting: Family Medicine

## 2022-06-30 NOTE — Telephone Encounter (Signed)
Copied from CRM [id number]. Topic: General - Other >> Jun 30, 2022 10:08 AM Clide Dales wrote: Patient is requesting a callback from [place].

## 2022-08-09 ENCOUNTER — Other Ambulatory Visit: Payer: Self-pay | Admitting: Family Medicine

## 2022-08-09 DIAGNOSIS — I10 Essential (primary) hypertension: Secondary | ICD-10-CM

## 2022-08-22 IMAGING — CR DG CHEST 2V
2 series · 2 of 2 positions shown · non-contrast
Comparison: 01/26/2018

CLINICAL DATA: Cough for 2 weeks.  Dyspnea on exertion.

EXAM:
CHEST - 2 VIEW

[chest pa]
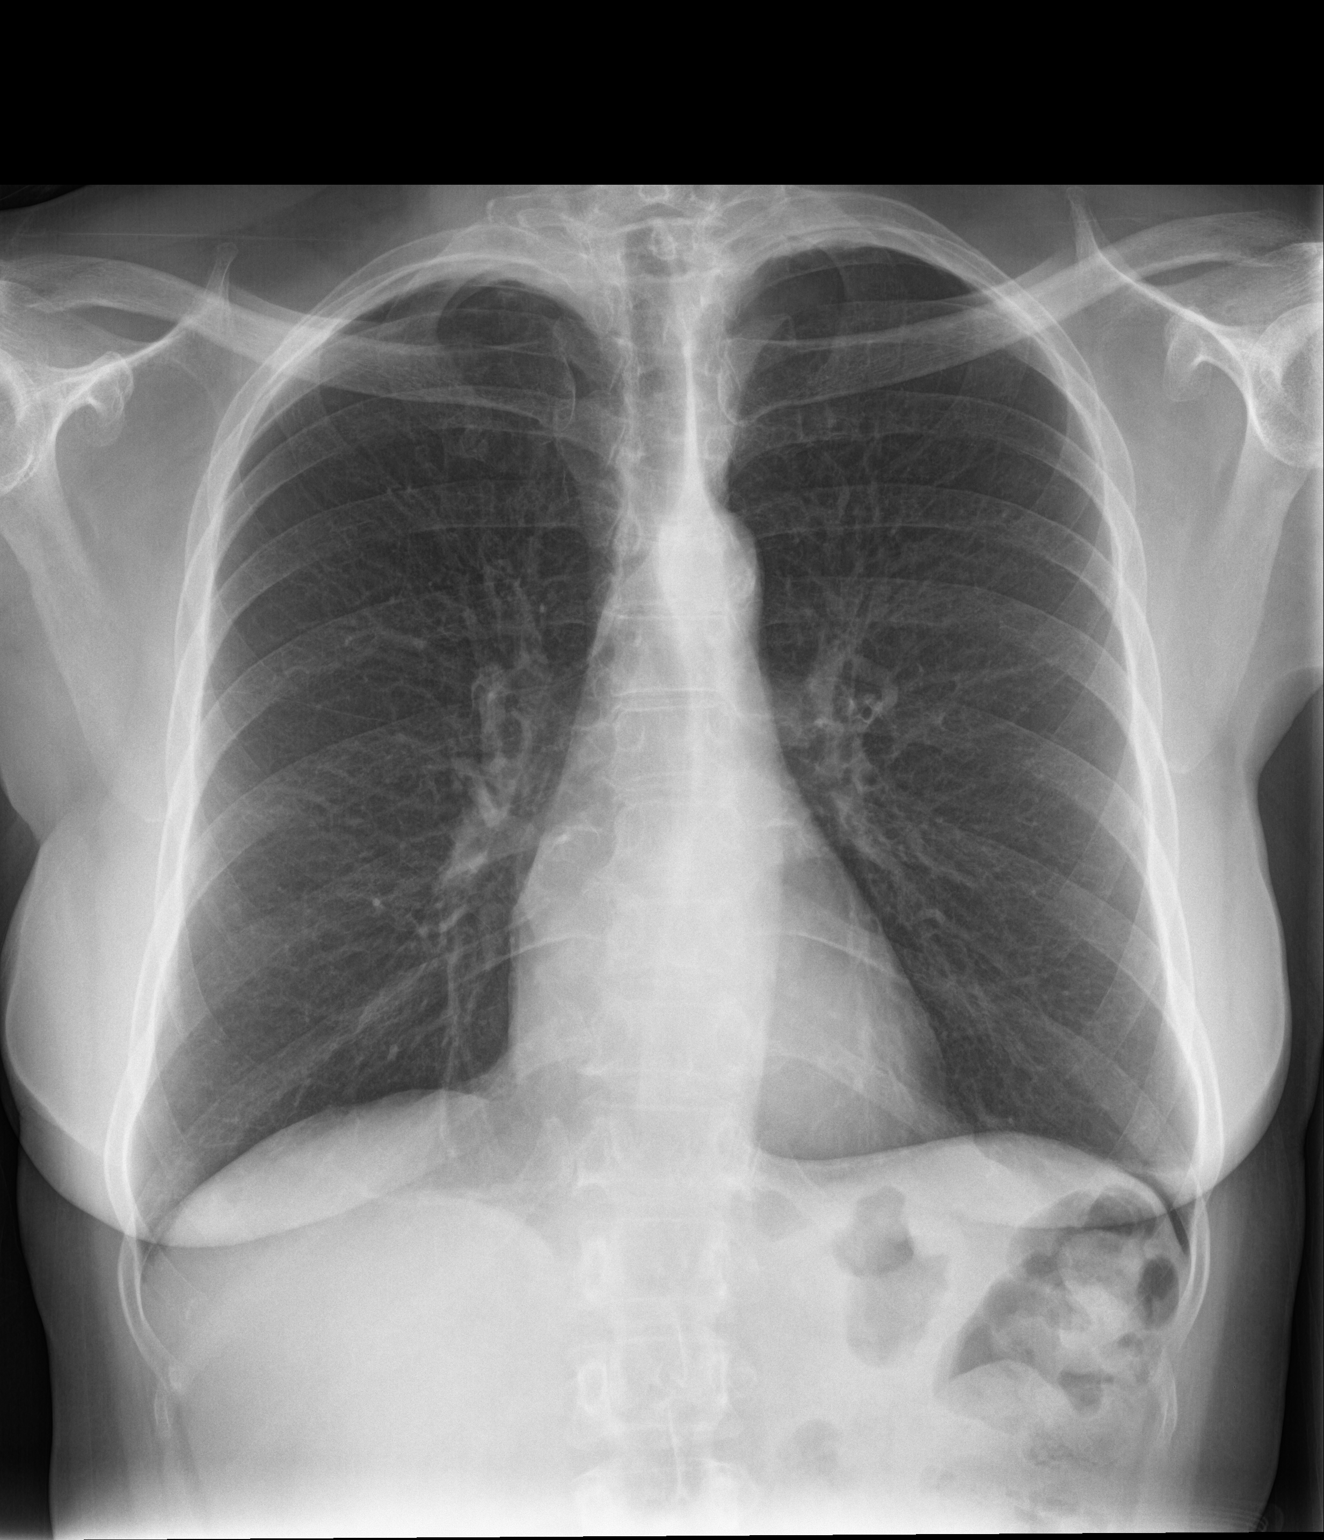

[chest lat]
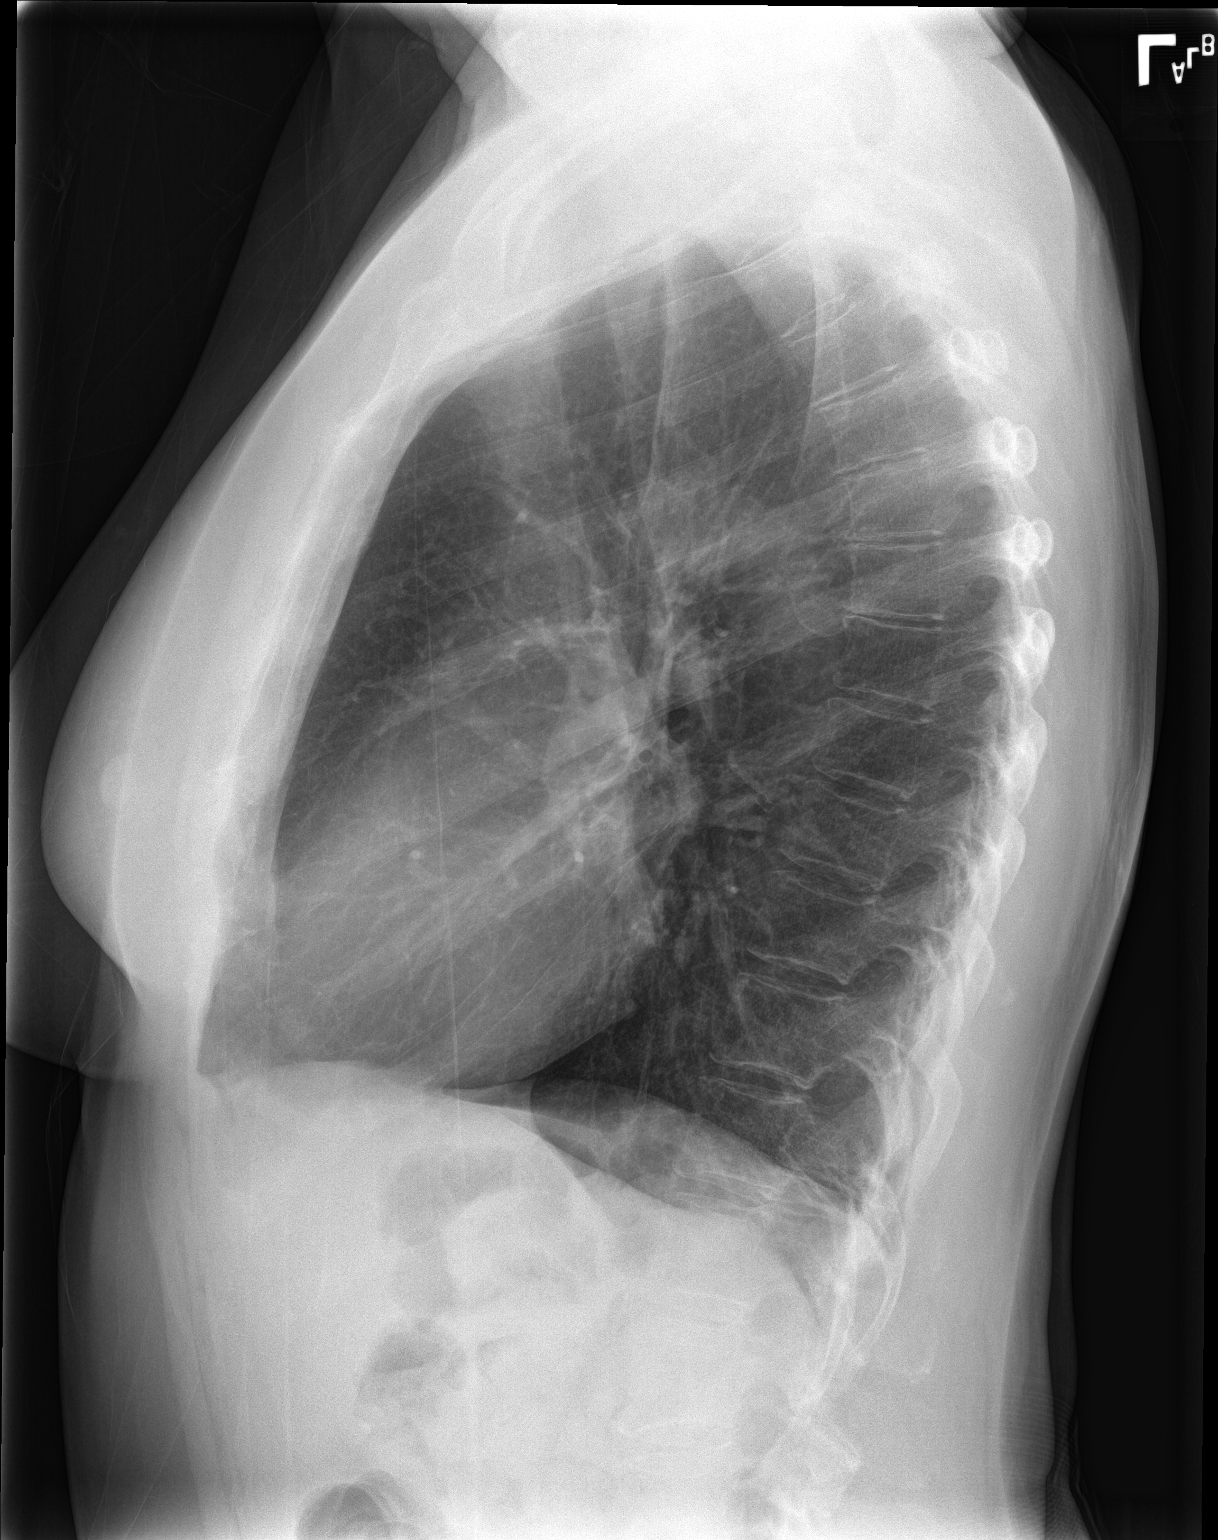

[2 of 2 positions shown; findings below may reference images not displayed]

FINDINGS: The heart size and mediastinal contours are within normal limits.
Aortic atherosclerotic calcification noted. Both lungs are clear.
The visualized skeletal structures are unremarkable.
IMPRESSION: No active cardiopulmonary disease.

## 2022-09-06 ENCOUNTER — Ambulatory Visit: Payer: BC Managed Care – PPO | Admitting: Family Medicine

## 2022-09-06 ENCOUNTER — Encounter: Payer: Self-pay | Admitting: Family Medicine

## 2022-09-06 VITALS — BP 120/82 | HR 65 | Ht 61.5 in | Wt 122.0 lb

## 2022-09-06 DIAGNOSIS — K219 Gastro-esophageal reflux disease without esophagitis: Secondary | ICD-10-CM | POA: Diagnosis not present

## 2022-09-06 DIAGNOSIS — E7801 Familial hypercholesterolemia: Secondary | ICD-10-CM

## 2022-09-06 DIAGNOSIS — I10 Essential (primary) hypertension: Secondary | ICD-10-CM

## 2022-09-06 DIAGNOSIS — N3281 Overactive bladder: Secondary | ICD-10-CM | POA: Diagnosis not present

## 2022-09-06 DIAGNOSIS — F5101 Primary insomnia: Secondary | ICD-10-CM

## 2022-09-06 DIAGNOSIS — D649 Anemia, unspecified: Secondary | ICD-10-CM

## 2022-09-06 MED ORDER — PANTOPRAZOLE SODIUM 40 MG PO TBEC
40.0000 mg | DELAYED_RELEASE_TABLET | Freq: Two times a day (BID) | ORAL | 1 refills | Status: AC
Start: 2022-09-06 — End: 2023-09-06

## 2022-09-06 MED ORDER — METOPROLOL SUCCINATE ER 25 MG PO TB24: 25.0000 mg | ORAL_TABLET | Freq: Every day | ORAL | 1 refills | Status: AC

## 2022-09-06 MED ORDER — OXYBUTYNIN CHLORIDE ER 10 MG PO TB24: 10.0000 mg | ORAL_TABLET | Freq: Every day | ORAL | 1 refills | Status: DC

## 2022-09-06 MED ORDER — HYDROCHLOROTHIAZIDE 12.5 MG PO TABS
12.5000 mg | ORAL_TABLET | Freq: Every day | ORAL | 1 refills | Status: AC
Start: 2022-09-06 — End: ?

## 2022-09-06 MED ORDER — TRAZODONE HCL 50 MG PO TABS
25.0000 mg | ORAL_TABLET | Freq: Every day | ORAL | 5 refills | Status: AC
Start: 2022-09-06 — End: ?

## 2022-09-06 MED ORDER — ATORVASTATIN CALCIUM 10 MG PO TABS: 10.0000 mg | ORAL_TABLET | Freq: Every day | ORAL | 1 refills | Status: DC

## 2022-09-06 NOTE — Progress Notes (Signed)
Date:  09/06/2022   Name:  Samantha Irwin   DOB:  [birth year]   MRN:  [id number]   Chief Complaint: Insomnia, Gastroesophageal Reflux, Hyperlipidemia, Hypertension, and overactive bladder  Insomnia Primary symptoms: fragmented sleep, no difficulty falling asleep, premature morning awakening, no malaise/fatigue.   The problem occurs intermittently. The problem has been gradually improving since onset. The treatment provided mild relief. PMH includes: no hypertension, no depression, no restless leg syndrome, no work related stressors, no chronic pain, no apnea.   Gastroesophageal Reflux She reports no abdominal pain, no belching, no chest pain, no choking, no coughing, no dysphagia, no globus sensation, no heartburn, no hoarse voice, no nausea, no sore throat or no stridor. This is a chronic problem. The current episode started more than 1 year ago. The problem has been waxing and waning. Nothing aggravates the symptoms. Pertinent negatives include no fatigue, muscle weakness or orthopnea. She has tried a PPI for the symptoms. The treatment provided moderate relief.  Hyperlipidemia The current episode started more than 1 year ago. The problem is controlled. She has no history of chronic renal disease, diabetes, hypothyroidism, liver disease, obesity or nephrotic syndrome. Pertinent negatives include no chest pain, focal sensory loss, leg pain or shortness of breath. Current antihyperlipidemic treatment includes statins. The current treatment provides moderate improvement of lipids.  Hypertension This is a chronic problem. The current episode started more than 1 year ago. The problem has been gradually improving since onset. Pertinent negatives include no chest pain, headaches, malaise/fatigue, orthopnea, palpitations, peripheral edema, shortness of breath or sweats. Risk factors for coronary artery disease include dyslipidemia. Past treatments include ACE inhibitors and beta blockers. The  current treatment provides mild improvement. There are no compliance problems.  There is no history of CAD/MI or CVA. There is no history of chronic renal disease, a hypertension causing med or renovascular disease.    Lab Results  Component Value Date   NA 137 01/18/2022   K 4.3 01/18/2022   CO2 27 01/18/2022   GLUCOSE 104 (H) 01/18/2022   BUN 18 01/18/2022   CREATININE 1.05 (H) 01/18/2022   CALCIUM 9.3 01/18/2022   EGFR 59 (L) 09/24/2021   GFRNONAA 60 (L) 01/18/2022   Lab Results  Component Value Date   CHOL 217 (H) 09/24/2021   HDL 92 09/24/2021   LDLCALC 107 (H) 09/24/2021   TRIG 104 09/24/2021   CHOLHDL 3.2 05/30/2018   Lab Results  Component Value Date   TSH 0.868 02/17/2021   Lab Results  Component Value Date   HGBA1C 5.2 09/13/2016   Lab Results  Component Value Date   WBC 5.1 01/18/2022   HGB 12.0 01/18/2022   HCT 36.4 01/18/2022   MCV 91.2 01/18/2022   PLT 280 01/18/2022   Lab Results  Component Value Date   ALT 20 02/17/2021   AST 25 02/17/2021   ALKPHOS 91 02/17/2021   BILITOT 0.3 02/17/2021   No results found for: "25OHVITD2", "25OHVITD3", "VD25OH"   Review of Systems  Constitutional:  Negative for fatigue and malaise/fatigue.  HENT:  Negative for hoarse voice and sore throat.   Eyes:  Negative for photophobia.  Respiratory:  Negative for apnea, cough, choking and shortness of breath.   Cardiovascular:  Negative for chest pain, palpitations and orthopnea.  Gastrointestinal:  Negative for abdominal pain, dysphagia, heartburn and nausea.  Musculoskeletal:  Negative for muscle weakness.  Neurological:  Negative for headaches.  Psychiatric/Behavioral:  Negative for depression. The patient has insomnia.  Patient Active Problem List   Diagnosis Date Noted   Overactive bladder 05/30/2018   Chronic obstructive pulmonary disease ([facility]) 05/30/2018   Familial hypercholesterolemia 05/30/2018   Flexural eczema 05/30/2018   Cigarette nicotine  dependence without complication 05/30/2018   Atherosclerosis of aorta ([facility]) 05/12/2017   Essential hypertension 06/12/2015    No Known Allergies  Past Surgical History:  Procedure Laterality Date   CESAREAN SECTION     COLONOSCOPY  2012   cleared for 5 yrs- [place]   COLONOSCOPY WITH PROPOFOL N/A 11/02/2016   Procedure: COLONOSCOPY WITH PROPOFOL;  Surgeon: Toney Reil, MD;  Location: [facility];  Service: Gastroenterology;  Laterality: N/A;   ERCP N/A 06/05/2020   Procedure: ENDOSCOPIC RETROGRADE CHOLANGIOPANCREATOGRAPHY (ERCP);  Surgeon: Midge Minium, MD;  Location: [facility] ENDOSCOPY;  Service: Endoscopy;  Laterality: N/A;   ESOPHAGOGASTRODUODENOSCOPY (EGD) WITH PROPOFOL N/A 06/05/2020   Procedure: ESOPHAGOGASTRODUODENOSCOPY (EGD) WITH PROPOFOL;  Surgeon: Midge Minium, MD;  Location: [facility] ENDOSCOPY;  Service: Endoscopy;  Laterality: N/A;    Social History   Tobacco Use   Smoking status: Former    Current packs/day: 0.00    Types: Cigarettes    Quit date: 06/09/2020    Years since quitting: 2.2   Smokeless tobacco: Never   Tobacco comments:    gave info on patches and pills  Vaping Use   Vaping status: Never Used  Substance Use Topics   Alcohol use: Not Currently    Alcohol/week: 3.0 standard drinks of alcohol    Types: 3 Glasses of wine per week   Drug use: No     Medication list has been reviewed and updated.  Current Meds  Medication Sig   albuterol (VENTOLIN HFA) 108 (90 Base) MCG/ACT inhaler Inhale 2 puffs into the lungs every 6 (six) hours as needed for wheezing or shortness of breath.   atorvastatin (LIPITOR) 10 MG tablet Take 1 tablet (10 mg total) by mouth daily.   B Complex Vitamins (B COMPLEX PO) Take by mouth daily.   Biotin 10 MG TABS Take 1 tablet by mouth daily.   carbamide peroxide (DEBROX) 6.5 % OTIC solution Place 5 drops into both ears 2 (two) times daily.   cholecalciferol (VITAMIN D) 1000 UNITS tablet Take 1,000 Units by mouth daily.    fluticasone furoate-vilanterol (BREO ELLIPTA) 100-25 MCG/INH AEPB Inhale 1 puff into the lungs in the morning.   hydrochlorothiazide (HYDRODIURIL) 12.5 MG tablet Take 1 tablet by mouth once daily   metoprolol succinate (TOPROL-XL) 25 MG 24 hr tablet Take 1 tablet (25 mg total) by mouth daily.   Multiple Vitamins-Minerals (MULTIVITAMIN WOMEN 50+ PO) Take 1 tablet by mouth daily.   mupirocin ointment (BACTROBAN) 2 % Apply 1 Application topically 2 (two) times daily.   Omega-3 Fatty Acids (FISH OIL) 1000 MG CAPS Take 1 capsule (1,000 mg total) by mouth daily.   oxybutynin (DITROPAN-XL) 5 MG 24 hr tablet Take 1 tablet (5 mg total) by mouth at bedtime.   pantoprazole (PROTONIX) 40 MG tablet Take 1 tablet (40 mg total) by mouth 2 (two) times daily before a meal.   thiamine 100 MG tablet Take 1 tablet (100 mg total) by mouth daily.   traZODone (DESYREL) 50 MG tablet Take 0.5-1 tablets (25-50 mg total) by mouth at bedtime.   triamcinolone ointment (KENALOG) 0.5 % Apply 1 application topically 2 (two) times daily.   vitamin B-12 (CYANOCOBALAMIN) 1000 MCG tablet Take 1 tablet (1,000 mcg total) by mouth daily.   vitamin C (ASCORBIC ACID)  250 MG tablet Take 250 mg by mouth daily.   [DISCONTINUED] doxycycline (VIBRAMYCIN) 100 MG capsule Take 1 capsule (100 mg total) by mouth 2 (two) times daily.   [DISCONTINUED] guaiFENesin-codeine (ROBITUSSIN AC) 100-10 MG/5ML syrup Take 5 mLs by mouth 3 (three) times daily as needed for cough.       09/06/2022    4:03 PM 02/19/2022    4:22 PM 01/28/2022   11:11 AM 01/14/2022    3:57 PM  GAD 7 : Generalized Anxiety Score  Nervous, Anxious, on Edge 0 0 0 0  Control/stop worrying 0 0 0 0  Worry too much - different things 0 0 0 0  Trouble relaxing 0 0 0 0  Restless 0 0 0 0  Easily annoyed or irritable 0 0 0 0  Afraid - awful might happen 0 0 0 0  Total GAD 7 Score 0 0 0 0  Anxiety Difficulty Not difficult at all Not difficult at all Not difficult at all Not difficult  at all       09/06/2022    4:03 PM 02/19/2022    4:22 PM 01/28/2022   11:11 AM  Depression screen PHQ 2/9  Decreased Interest 0 0 0  Down, Depressed, Hopeless 0 0 0  PHQ - 2 Score 0 0 0  Altered sleeping 0 0 0  Tired, decreased energy 0 0 0  Change in appetite 0 0 0  Feeling bad or failure about yourself  0 0 0  Trouble concentrating 0 0 0  Moving slowly or fidgety/restless 0 0 0  Suicidal thoughts 0 0 0  PHQ-9 Score 0 0 0  Difficult doing work/chores Not difficult at all Not difficult at all Not difficult at all    BP Readings from Last 3 Encounters:  09/06/22 120/82  02/19/22 132/86  01/28/22 100/60    Physical Exam Vitals and nursing note reviewed.  HENT:     Right Ear: There is impacted cerumen.     Left Ear: Tympanic membrane normal.     Mouth/Throat:     Pharynx: No oropharyngeal exudate or posterior oropharyngeal erythema.  Eyes:     Pupils: Pupils are equal, round, and reactive to light.  Cardiovascular:     Heart sounds: No murmur heard.    No friction rub. No gallop.  Pulmonary:     Breath sounds: No wheezing, rhonchi or rales.  Abdominal:     General: There is no distension.  Musculoskeletal:     Cervical back: No tenderness.  Lymphadenopathy:     Cervical: No cervical adenopathy.     Wt Readings from Last 3 Encounters:  09/06/22 122 lb (55.3 kg)  02/19/22 124 lb (56.2 kg)  01/28/22 118 lb (53.5 kg)    BP 120/82   Pulse 65   Ht 5' 1.5" (1.562 m)   Wt 122 lb (55.3 kg)   SpO2 97%   BMI 22.68 kg/m   Assessment and Plan:  1. Essential hypertension Chronic.  Controlled.  Stable.  Blood pressure today is 120/82.  Continue hydrochlorothiazide 12.5 mg once a day and metoprolol XL 25 mg once a day and and will check CMP for electrolytes and GFR. - hydrochlorothiazide (HYDRODIURIL) 12.5 MG tablet; Take 1 tablet (12.5 mg total) by mouth daily.  Dispense: 90 tablet; Refill: 1 - metoprolol succinate (TOPROL-XL) 25 MG 24 hr tablet; Take 1 tablet (25  mg total) by mouth daily.  Dispense: 90 tablet; Refill: 1 - Comprehensive Metabolic Panel (CMET) - oxybutynin (DITROPAN  XL) 10 MG 24 hr tablet; Take 1 tablet (10 mg total) by mouth at bedtime.  Dispense: 30 tablet; Refill: 1  2. Familial hypercholesterolemia Chronic.  Controlled.  Stable.  Continue atorvastatin 10 mg once a day.  Will check lipid panel for current level of control. - atorvastatin (LIPITOR) 10 MG tablet; Take 1 tablet (10 mg total) by mouth daily.  Dispense: 90 tablet; Refill: 1 - Lipid Panel With LDL/HDL Ratio  3. Gastroesophageal reflux disease, unspecified whether esophagitis present .  Controlled.  Stable.  Continue pantoprazole 40 mg 1 twice a day. - pantoprazole (PROTONIX) 40 MG tablet; Take 1 tablet (40 mg total) by mouth 2 (two) times daily before a meal.  Dispense: 180 tablet; Refill: 1  4. Overactive bladder Chronic.  Uncontrolled.  Stable.  But patient drinks water in the evening but is complaining of having to get up at night to go to the bathroom.  I am not certain that this is up an overactive nature or normal process of incomplete emptying of bladder and excessive fluid intake after 6 PM.  However we will increase oxybutynin to 10 mg XL and will recheck in 6 weeks.  5. Primary insomnia Chronic.  Controlled.  Patient is able to initiate sleep at 50 mg once a day.  Will continue at current dosing. - traZODone (DESYREL) 50 MG tablet; Take 0.5-1 tablets (25-50 mg total) by mouth at bedtime.  Dispense: 30 tablet; Refill: 5  6. Anemia, unspecified type Patient with history of anemia will check CBC for current level of hemoglobin hematocrit. - CBC w/Diff/Platelet    Elizabeth Sauer, MD

## 2022-09-07 ENCOUNTER — Other Ambulatory Visit: Payer: Self-pay

## 2022-09-07 MED ORDER — ATORVASTATIN CALCIUM 20 MG PO TABS: 20.0000 mg | ORAL_TABLET | Freq: Every day | ORAL | 1 refills | Status: AC

## 2022-09-30 ENCOUNTER — Encounter: Payer: BC Managed Care – PPO | Admitting: Family Medicine

## 2022-10-07 ENCOUNTER — Ambulatory Visit: Payer: BC Managed Care – PPO | Admitting: Family Medicine

## 2022-10-13 ENCOUNTER — Emergency Department
Admission: EM | Admit: 2022-10-13 | Discharge: 2022-10-13 | Disposition: A | Discharge status: Home / Self Care | Payer: BC Managed Care – PPO | Attending: Emergency Medicine | Admitting: Emergency Medicine

## 2022-10-13 ENCOUNTER — Other Ambulatory Visit: Payer: Self-pay

## 2022-10-13 ENCOUNTER — Emergency Department: Payer: BC Managed Care – PPO

## 2022-10-13 DIAGNOSIS — E876 Hypokalemia: Secondary | ICD-10-CM | POA: Insufficient documentation

## 2022-10-13 DIAGNOSIS — K5792 Diverticulitis of intestine, part unspecified, without perforation or abscess without bleeding: Secondary | ICD-10-CM

## 2022-10-13 DIAGNOSIS — R109 Unspecified abdominal pain: Secondary | ICD-10-CM | POA: Diagnosis present

## 2022-10-13 DIAGNOSIS — R197 Diarrhea, unspecified: Secondary | ICD-10-CM

## 2022-10-13 DIAGNOSIS — K5732 Diverticulitis of large intestine without perforation or abscess without bleeding: Secondary | ICD-10-CM | POA: Diagnosis not present

## 2022-10-13 LAB — COMPREHENSIVE METABOLIC PANEL
ALT: 19 U/L (ref 0–44)
AST: 18 U/L (ref 15–41)
Albumin: 4 g/dL (ref 3.5–5.0)
Alkaline Phosphatase: 71 U/L (ref 38–126)
Anion gap: 10 (ref 5–15)
BUN: 11 mg/dL (ref 8–23)
CO2: 23 mmol/L (ref 22–32)
Calcium: 8.6 mg/dL — ABNORMAL LOW (ref 8.9–10.3)
Chloride: 101 mmol/L (ref 98–111)
Creatinine, Ser: 0.87 mg/dL (ref 0.44–1.00)
GFR, Estimated: >60 mL/min (ref >60)
Glucose, Bld: 130 mg/dL — ABNORMAL HIGH (ref 70–99)
Potassium: 2.9 mmol/L — ABNORMAL LOW (ref 3.5–5.1)
Sodium: 134 mmol/L — ABNORMAL LOW (ref 135–145)
Total Bilirubin: 0.4 mg/dL (ref 0.3–1.2)
Total Protein: 7.6 g/dL (ref 6.5–8.1)

## 2022-10-13 LAB — CBC
HCT: 38.9 % (ref 36.0–46.0)
Hemoglobin: 12.8 g/dL (ref 12.0–15.0)
MCH: 29.8 pg (ref 26.0–34.0)
MCHC: 32.9 g/dL (ref 30.0–36.0)
MCV: 90.5 fL (ref 80.0–100.0)
Platelets: 288 10*3/uL (ref 150–400)
RBC: 4.3 MIL/uL (ref 3.87–5.11)
RDW: 11.6 % (ref 11.5–15.5)
WBC: 4.6 10*3/uL (ref 4.0–10.5)
nRBC: 0 % (ref 0.0–0.2)

## 2022-10-13 LAB — URINALYSIS, ROUTINE W REFLEX MICROSCOPIC
Bilirubin Urine: NEGATIVE
Glucose, UA: NEGATIVE mg/dL
Hgb urine dipstick: NEGATIVE
Ketones, ur: NEGATIVE mg/dL
Leukocytes,Ua: NEGATIVE
Nitrite: NEGATIVE
Protein, ur: NEGATIVE mg/dL
Specific Gravity, Urine: 1.011 (ref 1.005–1.030)
pH: 7 (ref 5.0–8.0)

## 2022-10-13 LAB — LIPASE, BLOOD: Lipase: 31 U/L (ref 11–51)

## 2022-10-13 MED ORDER — IOHEXOL 300 MG/ML  SOLN
100.0000 mL | Freq: Once | INTRAMUSCULAR | Status: AC | PRN
  Administered 2022-10-13: 100 mL via INTRAVENOUS

## 2022-10-13 MED ORDER — POTASSIUM CHLORIDE CRYS ER 20 MEQ PO TBCR
40.0000 meq | EXTENDED_RELEASE_TABLET | Freq: Once | ORAL | Status: AC
  Administered 2022-10-13: 40 meq via ORAL
  Filled 2022-10-13: qty 2

## 2022-10-13 MED ORDER — ONDANSETRON HCL 4 MG/2ML IJ SOLN
4.0000 mg | Freq: Once | INTRAMUSCULAR | Status: AC
  Administered 2022-10-13: 4 mg via INTRAVENOUS
  Filled 2022-10-13: qty 2

## 2022-10-13 MED ORDER — LOPERAMIDE HCL 2 MG PO TABS: 2.0000 mg | ORAL_TABLET | Freq: Four times a day (QID) | ORAL | 0 refills | Status: DC | PRN

## 2022-10-13 MED ORDER — CIPROFLOXACIN HCL 500 MG PO TABS: 500.0000 mg | ORAL_TABLET | Freq: Two times a day (BID) | ORAL | 0 refills | Status: AC

## 2022-10-13 MED ORDER — METRONIDAZOLE 500 MG PO TABS: 500.0000 mg | ORAL_TABLET | Freq: Three times a day (TID) | ORAL | 0 refills | Status: AC

## 2022-10-13 MED ORDER — MORPHINE SULFATE (PF) 4 MG/ML IV SOLN
4.0000 mg | Freq: Once | INTRAVENOUS | Status: AC
  Administered 2022-10-13: 4 mg via INTRAVENOUS
  Filled 2022-10-13: qty 1

## 2022-10-13 MED ORDER — SODIUM CHLORIDE 0.9 % IV BOLUS
1000.0000 mL | Freq: Once | INTRAVENOUS | Status: AC
  Administered 2022-10-13: 1000 mL via INTRAVENOUS

## 2022-10-13 MED ORDER — DICYCLOMINE HCL 20 MG PO TABS: 20.0000 mg | ORAL_TABLET | Freq: Three times a day (TID) | ORAL | 0 refills | Status: DC

## 2022-10-13 NOTE — ED Notes (Signed)
Return from CT.  AAOx3 skin warm and dry. NAD

## 2022-10-13 NOTE — ED Triage Notes (Signed)
Pt to ED for diarrhea started Monday with generalized abd pain. States cannot eat or drink water without having diarrhea.

## 2022-10-13 NOTE — ED Provider Notes (Signed)
[facility] Provider Note    Event Date/Time   First MD Initiated Contact with Patient 10/13/22 [phone]     (approximate)   History   Diarrhea   HPI  Samantha Irwin is a 62 y.o. female here with abdominal pain and diarrhea.  The patient states that for the last 3 days, she has had generalized abdominal pain, cramping.  She has had loose, watery diarrhea.  Anytime she eats or drinks anything, it runs right through her.  She has had associated mid and lower abdominal tenderness.  No fevers or chills.  She does state that she was at a cookout prior to the onset of symptoms.  No recent travel.  No recent antibiotic use.     Physical Exam   Triage Vital Signs: ED Triage Vitals [10/13/22 0902]  Encounter Vitals Group     BP (!) 156/98     Systolic BP Percentile      Diastolic BP Percentile      Pulse Rate 95     Resp 18     Temp 97.9 F (36.6 C)     Temp src      SpO2 100 %     Weight 123 lb (55.8 kg)     Height 5\' 1"  (1.549 m)     Head Circumference      Peak Flow      Pain Score 5     Pain Loc      Pain Education      Exclude from Growth Chart     Most recent vital signs: Vitals:   10/13/22 0902  BP: (!) 156/98  Pulse: 95  Resp: 18  Temp: 97.9 F (36.6 C)  SpO2: 100%     General: Awake, no distress.  CV:  Good peripheral perfusion.  Regular rate and rhythm.  No murmurs. Resp:  Normal work of breathing.  Abd:  No distention.  Mild diffuse tenderness.  No rebound or guarding.  Mildly hyperactive bowel sounds. Other:  Moist mucous membranes.   ED Results / Procedures / Treatments   Labs (all labs ordered are listed, but only abnormal results are displayed) Labs Reviewed  COMPREHENSIVE METABOLIC PANEL - Abnormal; Notable for the following components:      Result Value   Sodium 134 (*)    Potassium 2.9 (*)    Glucose, Bld 130 (*)    Calcium 8.6 (*)    All other components within normal limits  URINALYSIS, ROUTINE W REFLEX  MICROSCOPIC - Abnormal; Notable for the following components:   Color, Urine COLORLESS (*)    APPearance CLEAR (*)    All other components within normal limits  LIPASE, BLOOD  CBC     EKG    RADIOLOGY CT abdomen/pelvis: No acute abnormality.  There does appear to be extensive colonic diverticulosis with wall thickening, unclear superimposed acute diverticulitis, hepatic steatosis   I also independently reviewed and agree with radiologist interpretations.   PROCEDURES:  Critical Care performed: No  MEDICATIONS ORDERED IN ED: Medications  sodium chloride 0.9 % bolus 1,000 mL (1,000 mLs Intravenous New Bag/Given 10/13/22 0929)  ondansetron (ZOFRAN) injection 4 mg (4 mg Intravenous Given 10/13/22 0932)  morphine (PF) 4 MG/ML injection 4 mg (4 mg Intravenous Given 10/13/22 0933)  potassium chloride SA (KLOR-CON M) CR tablet 40 mEq (40 mEq Oral Given 10/13/22 1046)  iohexol (OMNIPAQUE) 300 MG/ML solution 100 mL (100 mLs Intravenous Contrast Given 10/13/22 1024)     IMPRESSION /  MDM / ASSESSMENT AND PLAN / ED COURSE  I reviewed the triage vital signs and the nursing notes.                              Differential diagnosis includes, but is not limited to, gastritis, pancreatitis, obstruction, diverticulitis, foodborne illness  Patient's presentation is most consistent with acute presentation with potential threat to life or bodily function.  The patient is on the cardiac monitor to evaluate for evidence of arrhythmia and/or significant heart rate changes   62 year old female here with abdominal pain, diarrhea.  CT scan shows diverticulosis with colonic wall thickening, no complications.  She has no fever.  No major leukocytosis.  No signs of sepsis.  CMP shows mild hypokalemia likely from her diarrheal losses and this will be replaced.  Given her CT findings, I do think it is reasonable to place her on empiric antibiotics in addition to give antidiarrheals.  We discussed the CT findings  and need for GI follow-up and will refer her as an outpatient.  Return precautions given.   FINAL CLINICAL IMPRESSION(S) / ED DIAGNOSES   Final diagnoses:  Diarrhea of presumed infectious origin  Diverticulitis  Hypokalemia     Rx / DC Orders   ED Discharge Orders          Ordered    ciprofloxacin (CIPRO) 500 MG tablet  2 times daily        10/13/22 1143    metroNIDAZOLE (FLAGYL) 500 MG tablet  3 times daily        10/13/22 1143    loperamide (IMODIUM A-D) 2 MG tablet  4 times daily PRN        10/13/22 1143    dicyclomine (BENTYL) 20 MG tablet  3 times daily before meals        10/13/22 1143             Note:  This document was prepared using Dragon voice recognition software and may include unintentional dictation errors.   Shaune Pollack, MD 10/13/22 1147

## 2022-10-13 NOTE — ED Notes (Signed)
C/O mid abdominal pain x 2 days and diarrhea. States diarrhea is watery.  States attended a BBQ on Sunday.

## 2022-10-14 ENCOUNTER — Telehealth: Payer: Self-pay

## 2022-10-14 NOTE — Transitions of Care (Post Inpatient/ED Visit) (Signed)
10/14/2022  Name: Samantha Irwin MRN: [id number] DOB: [birth year]  Today's TOC FU Call Status: Today's TOC FU Call Status:: Successful TOC FU Call Completed TOC FU Call Complete Date: 10/14/22 Patient's Name and Date of Birth confirmed.  Transition Care Management Follow-up Telephone Call Date of Discharge: 10/13/22 Discharge Facility: [facility]) Type of Discharge: Emergency Department Reason for ED Visit: Other: (diarrhea) How have you been since you were released from the hospital?: Same Any questions or concerns?: No  Items Reviewed: Did you receive and understand the discharge instructions provided?: Yes Medications obtained,verified, and reconciled?: Yes (Medications Reviewed) Any new allergies since your discharge?: No Dietary orders reviewed?: NA Do you have support at home?: No People in Home: child(ren), adult Name of Support/Comfort Primary Source: Nikki  Medications Reviewed Today: Medications Reviewed Today     Reviewed by Everitt Amber (Medical Assistant) on 10/14/22 at 0757  Med List Status: <None>   Medication Order Taking? Sig Documenting Provider Last Dose Status Informant  albuterol (VENTOLIN HFA) 108 (90 Base) MCG/ACT inhaler [id number] Yes Inhale 2 puffs into the lungs every 6 (six) hours as needed for wheezing or shortness of breath. Duanne Limerick, MD Taking Active Self           Med Note Wyline Mood, Suzan Slick   Tue Jun 03, 2020  4:00 PM) Uses only as needed for bronchitis symptoms  atorvastatin (LIPITOR) 20 MG tablet [id number] Yes Take 1 tablet (20 mg total) by mouth daily. Duanne Limerick, MD Taking Active   B Complex Vitamins (B COMPLEX PO) [id number] Yes Take by mouth daily. [provider] Taking Active Self  Biotin 10 MG TABS [id number] Yes Take 1 tablet by mouth daily. [provider] Taking Active Self  carbamide peroxide (DEBROX) 6.5 % OTIC solution [id number] Yes Place 5 drops into both ears 2 (two)  times daily. Duanne Limerick, MD Taking Active   cholecalciferol (VITAMIN D) 1000 UNITS tablet [id number] Yes Take 1,000 Units by mouth daily. [provider] Taking Active Self  ciprofloxacin (CIPRO) 500 MG tablet [id number] Yes Take 1 tablet (500 mg total) by mouth 2 (two) times daily for 7 days. Shaune Pollack, MD Taking Active   dicyclomine (BENTYL) 20 MG tablet [id number] Yes Take 1 tablet (20 mg total) by mouth 3 (three) times daily before meals for 5 days. Shaune Pollack, MD Taking Active   fluticasone furoate-vilanterol (BREO ELLIPTA) 100-25 MCG/INH AEPB [id number] Yes Inhale 1 puff into the lungs in the morning. Duanne Limerick, MD Taking Active Self           Med Note Bryson Dames   Tue Jun 03, 2020  4:02 PM) Samples given in office, did not continue use  hydrochlorothiazide (HYDRODIURIL) 12.5 MG tablet [id number] Yes Take 1 tablet (12.5 mg total) by mouth daily. Duanne Limerick, MD Taking Active   loperamide (IMODIUM A-D) 2 MG tablet [id number] Yes Take 1 tablet (2 mg total) by mouth 4 (four) times daily as needed for diarrhea or loose stools. Shaune Pollack, MD Taking Active   metoprolol succinate (TOPROL-XL) 25 MG 24 hr tablet [id number] Yes Take 1 tablet (25 mg total) by mouth daily. Duanne Limerick, MD Taking Active   metroNIDAZOLE (FLAGYL) 500 MG tablet [id number] Yes Take 1 tablet (500 mg total) by mouth 3 (three) times daily for 7 days. Shaune Pollack, MD Taking Active   Multiple Vitamins-Minerals (MULTIVITAMIN WOMEN 50+ PO) [id number] Yes Take 1 tablet by mouth daily. [provider] Taking Active Self  mupirocin ointment (BACTROBAN) 2 % [id number] Yes Apply 1 Application topically 2 (two) times daily. Raspet, Noberto Retort, PA-C Taking Active   Omega-3 Fatty Acids (FISH OIL) 1000 MG CAPS [id number] Yes Take 1 capsule (1,000 mg total) by mouth daily. Duanne Limerick, MD Taking Active Self  oxybutynin (DITROPAN XL) 10 MG 24 hr tablet [id number] Yes Take 1 tablet (10 mg total)  by mouth at bedtime. Duanne Limerick, MD Taking Active   oxybutynin (DITROPAN-XL) 5 MG 24 hr tablet [id number] Yes Take 1 tablet (5 mg total) by mouth at bedtime. Duanne Limerick, MD Taking Active   pantoprazole (PROTONIX) 40 MG tablet [id number] Yes Take 1 tablet (40 mg total) by mouth 2 (two) times daily before a meal. Duanne Limerick, MD Taking Active   thiamine 100 MG tablet [id number] Yes Take 1 tablet (100 mg total) by mouth daily. Rodolph Bong, MD Taking Active   traZODone (DESYREL) 50 MG tablet [id number] Yes Take 0.5-1 tablets (25-50 mg total) by mouth at bedtime. Duanne Limerick, MD Taking Active   triamcinolone ointment (KENALOG) 0.5 % [id number] Yes Apply 1 application topically 2 (two) times daily. Duanne Limerick, MD Taking Active Self           Med Note Bryson Dames   Tue Jun 03, 2020  4:04 PM) Uses around neck and arm when knows going to be out in the sun for long periods of time  vitamin B-12 (CYANOCOBALAMIN) 1000 MCG tablet [id number] Yes Take 1 tablet (1,000 mcg total) by mouth daily. Rodolph Bong, MD Taking Active   vitamin C (ASCORBIC ACID) 250 MG tablet [id number] Yes Take 250 mg by mouth daily. [provider] Taking Active Self            Home Care and Equipment/Supplies: Were Home Health Services Ordered?: NA Any new equipment or medical supplies ordered?: NA  Functional Questionnaire: Do you need assistance with bathing/showering or dressing?: No Do you need assistance with meal preparation?: No Do you need assistance with eating?: No Do you have difficulty maintaining continence: No Do you need assistance with getting out of bed/getting out of a chair/moving?: No Do you have difficulty managing or taking your medications?: No  Follow up appointments reviewed: PCP Follow-up appointment confirmed?: No (pt is "too weak" come in) [facility] Follow-up appointment confirmed?: No Do you need transportation to your follow-up appointment?:  No Do you understand care options if your condition(s) worsen?: Yes-patient verbalized understanding    SIGNATURE Arthur Holms

## 2023-01-25 ENCOUNTER — Ambulatory Visit: Payer: BC Managed Care – PPO | Admitting: Family Medicine

## 2023-01-25 ENCOUNTER — Encounter: Payer: Self-pay | Admitting: Family Medicine

## 2023-01-25 VITALS — BP 120/80 | HR 94 | Temp 100.0°F | Ht 61.0 in | Wt 119.0 lb

## 2023-01-25 DIAGNOSIS — U071 COVID-19: Secondary | ICD-10-CM

## 2023-01-25 DIAGNOSIS — M65311 Trigger thumb, right thumb: Secondary | ICD-10-CM

## 2023-01-25 DIAGNOSIS — R509 Fever, unspecified: Secondary | ICD-10-CM

## 2023-01-25 DIAGNOSIS — R059 Cough, unspecified: Secondary | ICD-10-CM | POA: Diagnosis not present

## 2023-01-25 LAB — POCT INFLUENZA A/B
Influenza A, POC: NEGATIVE
Influenza B, POC: NEGATIVE

## 2023-01-25 LAB — POC COVID19 BINAXNOW: SARS Coronavirus 2 Ag: NEGATIVE

## 2023-01-25 MED ORDER — NIRMATRELVIR/RITONAVIR (PAXLOVID)TABLET
3.0000 | ORAL_TABLET | Freq: Two times a day (BID) | ORAL | 0 refills | Status: AC
Start: 2023-01-25 — End: 2023-01-30

## 2023-01-25 MED ORDER — IBUPROFEN 600 MG PO TABS: 600.0000 mg | ORAL_TABLET | Freq: Three times a day (TID) | ORAL | 0 refills | Status: DC | PRN

## 2023-01-25 NOTE — Progress Notes (Signed)
Date:  01/25/2023   Name:  Samantha Irwin [facility]   DOB:  08-16-1960   MRN:  [id number]   Chief Complaint: Thumb Pain  (X3 weeks, right thumb. Locks up, getting worse, painful, 7 pain scale, hurts with movement, swelling ) and Cough (X1 day, chills, sweats, sore throat, runny nose, headaches, clear mucous, weakness, runny nose, clogged ears )  Fever  This is a new problem. The current episode started in the past 7 days. The maximum temperature noted was 100 to 100.9 F. Associated symptoms include congestion, coughing, headaches, muscle aches and a sore throat. Pertinent negatives include no abdominal pain, chest pain, diarrhea, ear pain, nausea, rash, sleepiness, urinary pain, vomiting or wheezing. She has tried acetaminophen for the symptoms. The treatment provided mild relief.  Risk factors: no contaminated food, no contaminated water, no hx of cancer, no immunosuppression, no occupational exposure, no recent sickness, no recent travel and no sick contacts     Lab Results  Component Value Date   NA 134 (L) 10/13/2022   K 2.9 (L) 10/13/2022   CO2 23 10/13/2022   GLUCOSE 130 (H) 10/13/2022   BUN 11 10/13/2022   CREATININE 0.87 10/13/2022   CALCIUM 8.6 (L) 10/13/2022   EGFR 62 09/06/2022   GFRNONAA >60 10/13/2022   Lab Results  Component Value Date   CHOL 253 (H) 09/06/2022   HDL 104 09/06/2022   LDLCALC 133 (H) 09/06/2022   TRIG 95 09/06/2022   CHOLHDL 3.2 05/30/2018   Lab Results  Component Value Date   TSH 0.868 02/17/2021   Lab Results  Component Value Date   HGBA1C 5.2 09/13/2016   Lab Results  Component Value Date   WBC 4.6 10/13/2022   HGB 12.8 10/13/2022   HCT 38.9 10/13/2022   MCV 90.5 10/13/2022   PLT 288 10/13/2022   Lab Results  Component Value Date   ALT 19 10/13/2022   AST 18 10/13/2022   ALKPHOS 71 10/13/2022   BILITOT 0.4 10/13/2022   No results found for: "25OHVITD2", "25OHVITD3", "VD25OH"   Review of Systems  Constitutional:  Positive for  fever.  HENT:  Positive for congestion and sore throat. Negative for ear pain, mouth sores, nosebleeds, postnasal drip, rhinorrhea, sinus pressure and sneezing.   Eyes:  Negative for photophobia and visual disturbance.  Respiratory:  Positive for cough. Negative for choking, shortness of breath and wheezing.   Cardiovascular:  Negative for chest pain.  Gastrointestinal:  Negative for abdominal pain, diarrhea, nausea and vomiting.  Endocrine: Negative for polydipsia and polyuria.  Genitourinary:  Negative for difficulty urinating and dysuria.  Skin:  Negative for rash.  Neurological:  Positive for headaches.    Patient Active Problem List   Diagnosis Date Noted   Overactive bladder 05/30/2018   Chronic obstructive pulmonary disease (HCC) 05/30/2018   Familial hypercholesterolemia 05/30/2018   Flexural eczema 05/30/2018   Cigarette nicotine dependence without complication 05/30/2018   Atherosclerosis of aorta (HCC) 05/12/2017   Essential hypertension 06/12/2015    No Known Allergies  Past Surgical History:  Procedure Laterality Date   CESAREAN SECTION     COLONOSCOPY  2012   cleared for 5 yrs- [place]   COLONOSCOPY WITH PROPOFOL N/A 11/02/2016   Procedure: COLONOSCOPY WITH PROPOFOL;  Surgeon: Toney Reil, MD;  Location[facility];  Service: Gastroenterology;  Laterality: N/A;   ERCP N/A 06/05/2020   Procedure: ENDOSCOPIC RETROGRADE CHOLANGIOPANCREATOGRAPHY (ERCP);  Surgeon: Midge Minium, MD;  Location: [facility];  Service: Endoscopy;  Laterality: N/A;   ESOPHAGOGASTRODUODENOSCOPY (EGD) WITH PROPOFOL N/A 06/05/2020   Procedure: ESOPHAGOGASTRODUODENOSCOPY (EGD) WITH PROPOFOL;  Surgeon: Midge Minium, MD;  Location: [facility];  Service: Endoscopy;  Laterality: N/A;    Social History   Tobacco Use   Smoking status: Former    Current packs/day: 0.00    Types: Cigarettes    Quit date: 06/09/2020    Years since quitting: 2.6   Smokeless tobacco: Never    Tobacco comments:    gave info on patches and pills  Vaping Use   Vaping status: Never Used  Substance Use Topics   Alcohol use: Not Currently    Alcohol/week: 3.0 standard drinks of alcohol    Types: 3 Glasses of wine per week   Drug use: No     Medication list has been reviewed and updated.  Current Meds  Medication Sig   albuterol (VENTOLIN HFA) 108 (90 Base) MCG/ACT inhaler Inhale 2 puffs into the lungs every 6 (six) hours as needed for wheezing or shortness of breath.   atorvastatin (LIPITOR) 20 MG tablet Take 1 tablet (20 mg total) by mouth daily.   B Complex Vitamins (B COMPLEX PO) Take by mouth daily.   Biotin 10 MG TABS Take 1 tablet by mouth daily.   cholecalciferol (VITAMIN D) 1000 UNITS tablet Take 1,000 Units by mouth daily.   fluticasone furoate-vilanterol (BREO ELLIPTA) 100-25 MCG/INH AEPB Inhale 1 puff into the lungs in the morning.   hydrochlorothiazide (HYDRODIURIL) 12.5 MG tablet Take 1 tablet (12.5 mg total) by mouth daily.   loperamide (IMODIUM A-D) 2 MG tablet Take 1 tablet (2 mg total) by mouth 4 (four) times daily as needed for diarrhea or loose stools.   metoprolol succinate (TOPROL-XL) 25 MG 24 hr tablet Take 1 tablet (25 mg total) by mouth daily.   Multiple Vitamins-Minerals (MULTIVITAMIN WOMEN 50+ PO) Take 1 tablet by mouth daily.   mupirocin ointment (BACTROBAN) 2 % Apply 1 Application topically 2 (two) times daily.   Omega-3 Fatty Acids (FISH OIL) 1000 MG CAPS Take 1 capsule (1,000 mg total) by mouth daily.   pantoprazole (PROTONIX) 40 MG tablet Take 1 tablet (40 mg total) by mouth 2 (two) times daily before a meal.   thiamine 100 MG tablet Take 1 tablet (100 mg total) by mouth daily.   traZODone (DESYREL) 50 MG tablet Take 0.5-1 tablets (25-50 mg total) by mouth at bedtime.   triamcinolone ointment (KENALOG) 0.5 % Apply 1 application topically 2 (two) times daily.   vitamin B-12 (CYANOCOBALAMIN) 1000 MCG tablet Take 1 tablet (1,000 mcg total) by mouth  daily.   vitamin C (ASCORBIC ACID) 250 MG tablet Take 250 mg by mouth daily.       01/25/2023    3:05 PM 09/06/2022    4:03 PM 02/19/2022    4:22 PM 01/28/2022   11:11 AM  GAD 7 : Generalized Anxiety Score  Nervous, Anxious, on Edge 0 0 0 0  Control/stop worrying 0 0 0 0  Worry too much - different things 0 0 0 0  Trouble relaxing 0 0 0 0  Restless 0 0 0 0  Easily annoyed or irritable 0 0 0 0  Afraid - awful might happen 0 0 0 0  Total GAD 7 Score 0 0 0 0  Anxiety Difficulty Not difficult at all Not difficult at all Not difficult at all Not difficult at all       01/25/2023    3:04 PM 09/06/2022  4:03 PM 02/19/2022    4:22 PM  Depression screen PHQ 2/9  Decreased Interest 0 0 0  Down, Depressed, Hopeless 0 0 0  PHQ - 2 Score 0 0 0  Altered sleeping 0 0 0  Tired, decreased energy 1 0 0  Change in appetite 0 0 0  Feeling bad or failure about yourself  0 0 0  Trouble concentrating 0 0 0  Moving slowly or fidgety/restless 0 0 0  Suicidal thoughts 0 0 0  PHQ-9 Score 1 0 0  Difficult doing work/chores Not difficult at all Not difficult at all Not difficult at all    BP Readings from Last 3 Encounters:  01/25/23 120/80  10/13/22 (!) 156/98  09/06/22 120/82    Physical Exam Vitals and nursing note reviewed. Exam conducted with a chaperone present.  Constitutional:      General: She is not in acute distress.    Appearance: She is not diaphoretic.  HENT:     Head: Normocephalic and atraumatic.     Right Ear: Tympanic membrane and external ear normal.     Left Ear: Tympanic membrane and external ear normal.     Nose: Nose normal.     Mouth/Throat:     Mouth: Mucous membranes are moist.  Eyes:     General:        Right eye: No discharge.        Left eye: No discharge.     Conjunctiva/sclera: Conjunctivae normal.     Pupils: Pupils are equal, round, and reactive to light.  Neck:     Thyroid: No thyromegaly.     Vascular: No JVD.  Cardiovascular:     Rate and  Rhythm: Normal rate and regular rhythm.     Heart sounds: Normal heart sounds. No murmur heard.    No friction rub. No gallop.  Pulmonary:     Effort: Pulmonary effort is normal.     Breath sounds: Normal breath sounds. No wheezing, rhonchi or rales.  Abdominal:     General: Bowel sounds are normal.     Palpations: Abdomen is soft. There is no mass.     Tenderness: There is no abdominal tenderness. There is no guarding.  Musculoskeletal:        General: Normal range of motion.     Cervical back: Normal range of motion and neck supple.  Lymphadenopathy:     Cervical: No cervical adenopathy.  Skin:    General: Skin is warm and dry.  Neurological:     Mental Status: She is alert.     Deep Tendon Reflexes: Reflexes are normal and symmetric.     Wt Readings from Last 3 Encounters:  01/25/23 119 lb (54 kg)  10/13/22 123 lb (55.8 kg)  09/06/22 122 lb (55.3 kg)    BP 120/80 (BP Location: Left Arm, Patient Position: Sitting)   Pulse 94   Temp 100 F (37.8 C) (Oral)   Ht 5\' 1"  (1.549 m)   Wt 119 lb (54 kg)   SpO2 95%   BMI 22.48 kg/m   Assessment and Plan: 1. Cough with fever (Primary) New onset.  Intermittent.  Stable.  Without dyspnea.  Exam is negative for pneumonia we will check for COVID and influenza.  Upon checking patient is positive for COVID. - POC COVID-19 - POCT Influenza A/B  2. COVID New onset.  Persistent relatively stable but we will go ahead and initiate Paxlovid 3 tablets twice a day.  GFR is noted  to be greater than 60 patient will continue with this for 10 days. - ibuprofen (ADVIL) 600 MG tablet; Take 1 tablet (600 mg total) by mouth every 8 (eight) hours as needed.  Dispense: 30 tablet; Refill: 0 - nirmatrelvir/ritonavir (PAXLOVID) 20 x 150 MG & 10 x 100MG  TABS; Take 3 tablets by mouth 2 (two) times daily for 5 days. (Take nirmatrelvir 150 mg two tablets twice daily for 5 days and ritonavir 100 mg one tablet twice daily for 5 days) Patient GFR is >60   Dispense: 30 tablet; Refill: 0  3. Trigger thumb of right hand New onset.  Persistent.  Episodic.  Patient does have a right thumb that is getting stuck consistent with trigger thumb and we will initiate ibuprofen 600 mg twice a day.  Referral has been placed with Dr. Ashley Royalty for evaluation and possible injection. - ibuprofen (ADVIL) 600 MG tablet; Take 1 tablet (600 mg total) by mouth every 8 (eight) hours as needed.  Dispense: 30 tablet; Refill: 0     Elizabeth Sauer, MD

## 2023-02-08 ENCOUNTER — Encounter: Payer: BC Managed Care – PPO | Admitting: Family Medicine

## 2023-02-08 ENCOUNTER — Encounter: Payer: Self-pay | Admitting: Family Medicine

## 2023-02-08 ENCOUNTER — Ambulatory Visit: Payer: BC Managed Care – PPO | Admitting: Family Medicine

## 2023-02-08 VITALS — BP 148/96 | HR 76 | Ht 61.0 in | Wt 119.2 lb

## 2023-02-08 DIAGNOSIS — M65319 Trigger thumb, unspecified thumb: Secondary | ICD-10-CM | POA: Insufficient documentation

## 2023-02-08 NOTE — Assessment & Plan Note (Signed)
 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 The patient reports soreness in the thumb area, particularly at the base of the thumb. The patient denies any recent increase in dexterity activities that could have triggered the symptoms. The patient also denies any numbness or tingling in the fingers. This is the first time the patient has experienced such symptoms in either hand. The patient reports that the thumb has been swelling intermittently, although it is not swollen at the time of the consultation.  Physical Exam MUSCULOSKELETAL: No atrophy of the thenar eminence. No tenderness upon palpation of the first [facility] joint. No tenderness upon palpation of the radial styloid. Mild tenderness upon palpation of the first MCP joint with nodule presence. Isolated DIP joint tenderness, unable to fully flex at DIP.  Assessment and Plan Stenosing Tenosynovitis (Trigger Finger) right thumb Right thumb locking and soreness for 1.5 months. No recent change in activity. Mild tenderness at the volar first MCP with nodular presence on palpation. Unable to fully flex the distal first phalange. -Start with immobilization using a brace during wakeful hours, avoiding bending at the thumb. -Apply topical anti-inflammatory (Diclofenac ) four times a day. -Start exercises in about a week to mitigate stiffness. -Check in 2-4 weeks to assess progress. If not improving, consider oral anti-inflammatories or steroid injection.

## 2023-02-08 NOTE — Progress Notes (Signed)
     Primary Care / Sports Medicine Office Visit  Patient Information:  Patient ID: Ahley Bulls, female DOB: 11/30/1960 Age: 62 y.o. MRN: [id number]   Samantha Irwin is a pleasant 62 y.o. female presenting with the following:  Chief Complaint  Patient presents with   Hand Pain    Trigger finger of the right thumb x 1.5 month, has been getting worse. Patient takes tylenol  for pain.     Vitals:   02/08/23 1405  BP: (!) 148/96  Pulse: 76  SpO2: 97%   Vitals:   02/08/23 1405  Weight: 119 lb 3.2 oz (54.1 kg)  Height: 5' 1 (1.549 m)   Body mass index is 22.52 kg/m.  No results found.   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 The patient reports soreness in the thumb area, particularly at the base of the thumb. The patient denies any recent increase in dexterity activities that could have triggered the symptoms. The patient also denies any numbness or tingling in the fingers. This is the first time the patient has experienced such symptoms in either hand. The patient reports that the thumb has been swelling intermittently, although it is not swollen at the time of the consultation.  Physical Exam MUSCULOSKELETAL: No atrophy of the thenar eminence. No tenderness upon palpation of the first [facility] joint. No tenderness upon palpation of the radial styloid. Mild tenderness upon palpation of the first MCP joint with nodule presence. Isolated DIP joint tenderness, unable to fully flex at  DIP.  Assessment and Plan Stenosing Tenosynovitis (Trigger Finger) right thumb Right thumb locking and soreness for 1.5 months. No recent change in activity. Mild tenderness at the volar first MCP with nodular presence on palpation. Unable to fully flex the distal first phalange. -Start with immobilization using a brace during wakeful hours, avoiding bending at the thumb. -Apply topical anti-inflammatory (Diclofenac ) four times a day. -Start exercises in about a week to mitigate stiffness. -Check in 2-4 weeks to assess progress. If not improving, consider oral anti-inflammatories or steroid injection.      I provided a total time of 34 minutes including both face-to-face and non-face-to-face time on 02/08/2023 inclusive of time utilized for medical chart review, information gathering, care coordination with staff, and documentation completion.   Orders & Medications Medications: No orders of the defined types were placed in this encounter.  No orders of the defined types were placed in this encounter.    No follow-ups on file.     Selinda JINNY Ku, MD, [facility]   Primary Care Sports Medicine Primary Care and Sports Medicine at [facility]

## 2023-02-08 NOTE — Patient Instructions (Signed)
-  Start with immobilization using a brace during wakeful hours, avoiding bending at the thumb. -Apply topical anti-inflammatory (Diclofenac ) four times a day. -Start exercises in about a week to mitigate stiffness. -Check in 2-4 weeks to assess progress. If not improving, consider oral anti-inflammatories or steroid injection.

## 2023-04-12 ENCOUNTER — Other Ambulatory Visit: Payer: Self-pay

## 2023-04-12 ENCOUNTER — Encounter: Payer: Self-pay | Admitting: Family Medicine

## 2023-04-12 ENCOUNTER — Other Ambulatory Visit: Payer: Self-pay | Admitting: Family Medicine

## 2023-04-12 DIAGNOSIS — I10 Essential (primary) hypertension: Secondary | ICD-10-CM

## 2023-04-12 DIAGNOSIS — L2082 Flexural eczema: Secondary | ICD-10-CM

## 2023-04-12 MED ORDER — TRIAMCINOLONE ACETONIDE 0.5 % EX OINT: 1.0000 | TOPICAL_OINTMENT | Freq: Two times a day (BID) | CUTANEOUS | 0 refills | Status: AC

## 2023-04-12 NOTE — Telephone Encounter (Signed)
 Copied from CRM [id number]. Topic: Clinical - Medication Refill >> Apr 12, 2023  1:13 PM Shelah Lewandowsky wrote: Most Recent Primary Care Visit:  Provider: Jerrol Banana  Department: [facility]  Visit Type: NEW ORTHO  Date: 02/08/2023  Medication: oxybutynin (DITROPAN XL) 10 MG 24 hr tablet  Has the patient contacted their pharmacy? No (Agent: If no, request that the patient contact the pharmacy for the refill. If patient does not wish to contact the pharmacy document the reason why and proceed with request.) (Agent: If yes, when and what did the pharmacy advise?)  Is this the correct pharmacy for this prescription? Yes If no, delete pharmacy and type the correct one.  This is the patient's preferred pharmacy:  [facility] [address] [place] - [address] [place] [address] Phone: [phone] Fax: [phone]   Has the prescription been filled recently? Yes  Is the patient out of the medication? Yes  Has the patient been seen for an appointment in the last year OR does the patient have an upcoming appointment? Yes  Can we respond through MyChart? Yes  Agent: Please be advised that Rx refills may take up to 3 business days. We ask that you follow-up with your pharmacy.

## 2023-04-12 NOTE — Telephone Encounter (Signed)
 Refilled  Copied from CRM [id number]. Topic: Clinical - Medication Question >> Apr 12, 2023  1:11 PM Samantha Irwin wrote: Reason for CRM: need a prescription for eczema was using triamcinolone ointment (KENALOG) 0.5 % before and wants to know if she can get it again please call [phone]

## 2023-05-10 ENCOUNTER — Other Ambulatory Visit: Payer: Self-pay | Admitting: Family Medicine

## 2023-05-10 DIAGNOSIS — I10 Essential (primary) hypertension: Secondary | ICD-10-CM

## 2023-11-22 ENCOUNTER — Other Ambulatory Visit: Payer: Self-pay | Admitting: Internal Medicine

## 2023-11-22 DIAGNOSIS — Z1231 Encounter for screening mammogram for malignant neoplasm of breast: Secondary | ICD-10-CM

## 2024-01-02 ENCOUNTER — Ambulatory Visit
Admission: RE | Admit: 2024-01-02 | Discharge: 2024-01-02 | Disposition: A | Discharge status: Home / Self Care | Payer: Self-pay | Source: Ambulatory Visit | Attending: Internal Medicine | Admitting: Internal Medicine

## 2024-01-02 DIAGNOSIS — Z1231 Encounter for screening mammogram for malignant neoplasm of breast: Secondary | ICD-10-CM | POA: Insufficient documentation
